# Patient Record
Sex: Female | Born: 1955 | Race: Black or African American | Hispanic: No | Smoking: Current every day smoker
Health system: Southern US, Community
[De-identification: ages and names within clinical notes are randomized; demographics above are authoritative.]

## PROBLEM LIST (undated history)

## (undated) DIAGNOSIS — Z9289 Personal history of other medical treatment: Secondary | ICD-10-CM

## (undated) DIAGNOSIS — I252 Old myocardial infarction: Secondary | ICD-10-CM

## (undated) HISTORY — PX: MULTIPLE TOOTH EXTRACTIONS: SHX2053

## (undated) MED FILL — Dexamethasone Sodium Phosphate Inj 100 MG/10ML: INTRAMUSCULAR | Qty: 1 | Status: AC

---

## 1999-10-02 ENCOUNTER — Emergency Department (HOSPITAL_COMMUNITY): Admission: EM | Admit: 1999-10-02 | Discharge: 1999-10-02 | Payer: Self-pay | Admitting: Emergency Medicine

## 1999-10-02 ENCOUNTER — Encounter: Payer: Self-pay | Admitting: Emergency Medicine

## 2000-07-28 ENCOUNTER — Emergency Department (HOSPITAL_COMMUNITY): Admission: EM | Admit: 2000-07-28 | Discharge: 2000-07-28 | Payer: Self-pay | Admitting: Emergency Medicine

## 2000-07-28 ENCOUNTER — Encounter: Payer: Self-pay | Admitting: Emergency Medicine

## 2009-10-08 ENCOUNTER — Emergency Department (HOSPITAL_COMMUNITY): Admission: EM | Admit: 2009-10-08 | Discharge: 2009-10-08 | Payer: Self-pay | Admitting: Emergency Medicine

## 2009-12-28 ENCOUNTER — Emergency Department (HOSPITAL_COMMUNITY): Admission: EM | Admit: 2009-12-28 | Discharge: 2009-12-28 | Payer: Self-pay | Admitting: Emergency Medicine

## 2010-08-31 LAB — POCT CARDIAC MARKERS
CKMB, poc: <1 ng/mL — ABNORMAL LOW (ref 1.0–8.0)
Myoglobin, poc: 81 ng/mL (ref 12–200)
Troponin i, poc: <0.05 ng/mL (ref 0.00–0.09)

## 2010-08-31 LAB — COMPREHENSIVE METABOLIC PANEL
ALT: 16 U/L (ref 0–35)
Calcium: 9.1 mg/dL (ref 8.4–10.5)
Creatinine, Ser: 0.98 mg/dL (ref 0.4–1.2)
GFR calc Af Amer: >60 mL/min (ref >60)
Glucose, Bld: 77 mg/dL (ref 70–99)
Sodium: 139 mEq/L (ref 135–145)
Total Protein: 7.9 g/dL (ref 6.0–8.3)

## 2010-08-31 LAB — POCT I-STAT, CHEM 8
BUN: 11 mg/dL (ref 6–23)
Calcium, Ion: 1.17 mmol/L (ref 1.12–1.32)
Chloride: 111 mEq/L (ref 96–112)
Creatinine, Ser: 0.9 mg/dL (ref 0.4–1.2)
Glucose, Bld: 72 mg/dL (ref 70–99)
HCT: 43 % (ref 36.0–46.0)
Hemoglobin: 14.6 g/dL (ref 12.0–15.0)
Potassium: 3.8 mEq/L (ref 3.5–5.1)
Sodium: 144 mEq/L (ref 135–145)
TCO2: 24 mmol/L (ref 0–100)

## 2010-08-31 LAB — DIFFERENTIAL
Eosinophils Absolute: 0.1 10*3/uL (ref 0.0–0.7)
Lymphocytes Relative: 27 % (ref 12–46)
Lymphs Abs: 2.3 10*3/uL (ref 0.7–4.0)
Monocytes Relative: 4 % (ref 3–12)
Neutrophils Relative %: 67 % (ref 43–77)

## 2010-08-31 LAB — CBC
HCT: 39.8 % (ref 36.0–46.0)
Hemoglobin: 13.6 g/dL (ref 12.0–15.0)
MCHC: 34.2 g/dL (ref 30.0–36.0)
MCV: 93.5 fL (ref 78.0–100.0)
Platelets: 264 K/uL (ref 150–400)
RBC: 4.26 MIL/uL (ref 3.87–5.11)
RDW: 14.5 % (ref 11.5–15.5)
WBC: 8.4 K/uL (ref 4.0–10.5)

## 2010-08-31 LAB — LIPASE, BLOOD: Lipase: 28 U/L (ref 11–59)

## 2011-02-12 DIAGNOSIS — I252 Old myocardial infarction: Secondary | ICD-10-CM

## 2011-02-12 HISTORY — DX: Old myocardial infarction: I25.2

## 2011-02-18 ENCOUNTER — Inpatient Hospital Stay (HOSPITAL_COMMUNITY)
Admission: EM | Admit: 2011-02-18 | Discharge: 2011-02-19 | DRG: 313 | Discharge status: Against Medical Advice | Payer: Self-pay | Attending: Cardiology | Admitting: Cardiology

## 2011-02-18 ENCOUNTER — Emergency Department (HOSPITAL_COMMUNITY): Payer: Self-pay

## 2011-02-18 DIAGNOSIS — R079 Chest pain, unspecified: Principal | ICD-10-CM | POA: Diagnosis present

## 2011-02-18 DIAGNOSIS — Z7982 Long term (current) use of aspirin: Secondary | ICD-10-CM

## 2011-02-18 DIAGNOSIS — F1410 Cocaine abuse, uncomplicated: Secondary | ICD-10-CM | POA: Diagnosis present

## 2011-02-18 DIAGNOSIS — Z8249 Family history of ischemic heart disease and other diseases of the circulatory system: Secondary | ICD-10-CM

## 2011-02-18 DIAGNOSIS — F17200 Nicotine dependence, unspecified, uncomplicated: Secondary | ICD-10-CM | POA: Diagnosis present

## 2011-02-18 LAB — CBC
HCT: 36.7 % (ref 36.0–46.0)
Hemoglobin: 12.4 g/dL (ref 12.0–15.0)
MCHC: 33.8 g/dL (ref 30.0–36.0)
Platelets: 231 10*3/uL (ref 150–400)
RBC: 4.17 MIL/uL (ref 3.87–5.11)
RDW: 13.9 % (ref 11.5–15.5)
WBC: 7.6 10*3/uL (ref 4.0–10.5)

## 2011-02-18 LAB — COMPREHENSIVE METABOLIC PANEL
ALT: 16 U/L (ref 0–35)
BUN: 14 mg/dL (ref 6–23)
CO2: 25 mEq/L (ref 19–32)
Calcium: 9.1 mg/dL (ref 8.4–10.5)
Creatinine, Ser: 0.91 mg/dL (ref 0.50–1.10)
GFR calc Af Amer: >60 mL/min (ref >60)
GFR calc non Af Amer: >60 mL/min (ref >60)
Glucose, Bld: 110 mg/dL — ABNORMAL HIGH (ref 70–99)

## 2011-02-19 LAB — LIPID PANEL
HDL: 45 mg/dL (ref >39)
LDL Cholesterol: 129 mg/dL — ABNORMAL HIGH (ref 0–99)
Total CHOL/HDL Ratio: 4.4 RATIO

## 2011-02-19 LAB — BASIC METABOLIC PANEL
BUN: 13 mg/dL (ref 6–23)
GFR calc non Af Amer: >60 mL/min (ref >60)
Glucose, Bld: 93 mg/dL (ref 70–99)
Potassium: 3.8 mEq/L (ref 3.5–5.1)

## 2011-02-19 LAB — CBC
MCV: 89.4 fL (ref 78.0–100.0)
Platelets: 252 10*3/uL (ref 150–400)
RDW: 14.3 % (ref 11.5–15.5)
WBC: 8.1 10*3/uL (ref 4.0–10.5)

## 2011-02-19 LAB — HEPARIN LEVEL (UNFRACTIONATED): Heparin Unfractionated: 0.36 IU/mL (ref 0.30–0.70)

## 2011-02-19 LAB — CARDIAC PANEL(CRET KIN+CKTOT+MB+TROPI)
CK, MB: 6.1 ng/mL (ref 0.3–4.0)
Troponin I: 0.67 ng/mL (ref <0.30)

## 2011-02-19 LAB — MRSA PCR SCREENING: MRSA by PCR: NEGATIVE

## 2011-02-19 LAB — TROPONIN I: Troponin I: 0.58 ng/mL (ref <0.30)

## 2011-02-19 NOTE — H&P (Signed)
[name], [name] NO.:  [id number]  MEDICAL RECORD NO.:  [id number]  LOCATION:  3313                         FACILITY:  [facility]  PHYSICIAN:  Zacarias Pontes, MD       DATE OF BIRTH:  05/26/1956  DATE OF ADMISSION:  02/18/2011 DATE OF DISCHARGE:                             HISTORY & PHYSICAL   PRIMARY CARDIOLOGIST:  None established.  CHIEF COMPLAINT:  Chest pain.  HISTORY OF PRESENT ILLNESS:  Ms. [name] is a pleasant 55 year old woman with a history of tobacco use, family history of premature coronary artery disease and an ongoing history of cocaine use who presents with 3- 4 days of chest pain.  Beginning on Monday of this past week, she began to experience chest pain which she describes as radiating from her left upper chest to her right upper chest.  She reports it is made worse when she lies flat and it is better when she sits up.  Today, her pain was associated with shortness of breath, diaphoresis and an episode of nausea.  Given what she felt was the evolution in her symptoms, she felt it is important to seek medical attention for further evaluation.  Of note, after a 2-year hiatus, the patient restarted regular coffee drinking earlier this week.  After careful and persistent questioning, the patient also admits to using cocaine this past weekend.  She generally smokes cocaine, and denies injection or drug use.  She denies paroxysmal nocturnal dyspnea, orthopnea, or any recent leg or ankle swelling.  She denies fevers, chills, or night sweats.  She denies palpitations or syncope.  PAST MEDICAL HISTORY:  The patient reports not having any significant past medical history.  SOCIAL HISTORY:  The patient reports living with a friend.  She currently smokes 2 packs a day and began smoking when she was 55 years old.  She also reports occasional cocaine use.  FAMILY HISTORY:  She has 2 sisters who experienced premature coronary artery disease.  REVIEW  OF SYSTEMS:  As per HPI, otherwise is comprehensively negative.  ALLERGIES:  No known allergies.  MEDICATIONS:  No current medications.  PHYSICAL EXAMINATION:  VITAL SIGNS:  The patient is afebrile with a heart rate of 68 and a blood pressure of 114/78, respiratory rate is 15 and she is satting 98% on 2 liters nasal cannula. GENERAL:  She is in no acute distress and is not using any accessory muscles to breathe. HEENT:  Notable for door dentition. NECK:  Supple with no masses or lymphadenopathy.  JVP is not appreciably elevated. CARDIAC:  Notable for regular rate with a normal S1, S2 with no murmurs or rubs. LUNGS:  Clear to auscultation bilaterally with no crackles or wheezes appreciated. ABDOMEN:  Soft, nontender, nondistended with no abdominal bruits. EXTREMITIES:  Warm and well perfused with no lower extremity edema.  DP pulses are 2+ bilaterally. NEUROLOGICAL:  She is alert and oriented x3 with no focal neurologic deficits detected.  LABORATORY EVALUATION:  Her white count of 7000, her hematocrit is 36, platelets 231,000.  Sodium 141, potassium 3.8, chloride 107, bicarb 25, BUN 14, creatinine 0.8 with a glucose of 110.  Troponin of 0.58 was discovered.  Her EKG demonstrates normal sinus rhythm with no ST-segment deviation. She does have T-wave inversions in V2 through V4.  IMPRESSION:  This is a 55 year old woman with ongoing tobacco use, family history of coronary artery disease and ongoing cocaine use who presents with chest pain in the setting of recent cocaine use with mild troponinemia.  She is currently chest pain free.  Her onset of chest pain does seem to correlate fairly well with her most recent use of cocaine.  Her troponin in isolation does not tell us her overall trajectory or when she might have experienced myocardial strain or ischemia.  I do not think she is in the active throes and of an acute coronary syndrome, but nevertheless, we will trend her cardiac  enzymes and her electrocardiograms to get a better sense of her trajectory, all while keeping her on aspirin as well as IV heparin in the short term. We will avoid beta-blockade for now given her recent cocaine history in the concern for paradoxical reaction to a beta-blocker.  By virtue of her ongoing substance abuse, she seems a poor candidate for invasive therapies and I would favor an invasive approach only if we are pushed by her symptoms or worsening of her status.  In the interim, we will check a transthoracic echo to assess her overall function and to assess for any focal wall motion abnormalities.  Her questions were answered to the best of my ability.          ______________________________ Zacarias Pontes, MD     DM/MEDQ  D:  02/19/2011  T:  02/19/2011  Job:  [id number]  Electronically Signed by Zacarias Pontes MD on 02/19/2011 07:13:35 AM

## 2011-05-10 NOTE — Discharge Summary (Signed)
[name], [name] NO.:  [id number]  MEDICAL RECORD NO.:  [id number]  LOCATION:  3313                         FACILITY:  [facility]  PHYSICIAN:  Jonelle Sidle, MD DATE OF BIRTH:  Jul 16, 1955  DATE OF ADMISSION:  02/18/2011 DATE OF DISCHARGE:  02/19/2011                              DISCHARGE SUMMARY   THE PATIENT LEFT AGAINST MEDICAL ADVICE  DATE PATIENT LEFT AGAINST MEDICAL ADVICE:  February 19, 2011.  CARDIOLOGIST:  None.  REASON FOR ADMISSION:  Chest pain.  DISCHARGE DIAGNOSES: 1. Chest pain with elevated cardiac markers, concerning for non-ST-     elevation myocardial infarction.     a.     Workup incomplete as the patient left against medical      advice. 2. Dyslipidemia. 3. Cocaine abuse. 4. Tobacco abuse. 5. Family history of coronary artery disease.  HOSPITAL COURSE:  [name] was admitted on February 18, 2011, by Dr. Zacarias Pontes who was on-call as the cardiology fellow.  She presented to the emergency room with chest pain that began several days prior to admission.  It was worse when she would lay flat and better when she would sit up.  Her pain was associated with shortness of breath and diaphoresis and episode of nausea on the date of presentation.  She had recently started drinking coffee again.  She also admitted to cocaine use the weekend prior.  Her EKG demonstrated no ST-segment deviation, but she did have T-wave inversions in V2 through V4.  She was admitted for further evaluation.  Her chest pain seemed to correlate with her most recent use of cocaine.  It was uncertain if elevated troponin was secondary to myocardial strain or infarction (type 1 versus type 2 NSTEMI).  She was maintained on aspirin and IV heparin.  Beta-blockers were avoided due to recent use of cocaine.  An echocardiogram was ordered.  Her troponin was noted to increase from 0.58 to 0.75.  On the morning of February 19, 2011, I was contacted by the nurse that the  patient desired to leave against medical advice.  I explained to the patient that there was significant risk of myocardial infarction, congestive heart failure, death without further evaluation.  She insisted on leaving and did leave the hospital.  Dr. Diona Browner never had the opportunity to see the patient.  LABORATORY AND ANCILLARY DATA:  Hemoglobin 12.4.  Potassium 3.8, creatinine 0.91, ALT 16.  Hemoglobin A1c 5.6.  Troponin I is 0.58, 0.75, 0.67.  CK-MB 6.1, 6.0.  Total cholesterol 200, triglycerides 128, HDL 45, LDL 129.  MRSA PCR negative. Chest x-ray on admission, no acute cardiopulmonary process seen, chronic peribronchial thickening noted.  DISPOSITION:  As noted, the patient left against medical advice prior to complete cardiac workup being completed.     Tereso Newcomer, PA-C   ______________________________ Jonelle Sidle, MD    SW/MEDQ  D:  05/10/2011  T:  05/10/2011  Job:  [id number]

## 2012-04-13 ENCOUNTER — Encounter (HOSPITAL_COMMUNITY): Payer: Self-pay | Admitting: Family Medicine

## 2012-04-13 ENCOUNTER — Emergency Department (HOSPITAL_COMMUNITY): Payer: Self-pay

## 2012-04-13 ENCOUNTER — Emergency Department (HOSPITAL_COMMUNITY)
Admission: EM | Admit: 2012-04-13 | Discharge: 2012-04-13 | Disposition: A | Discharge status: Home / Self Care | Payer: Self-pay | Attending: Emergency Medicine | Admitting: Emergency Medicine

## 2012-04-13 DIAGNOSIS — S8253XA Displaced fracture of medial malleolus of unspecified tibia, initial encounter for closed fracture: Secondary | ICD-10-CM | POA: Insufficient documentation

## 2012-04-13 DIAGNOSIS — F17200 Nicotine dependence, unspecified, uncomplicated: Secondary | ICD-10-CM | POA: Insufficient documentation

## 2012-04-13 DIAGNOSIS — Y929 Unspecified place or not applicable: Secondary | ICD-10-CM | POA: Insufficient documentation

## 2012-04-13 DIAGNOSIS — S82409A Unspecified fracture of shaft of unspecified fibula, initial encounter for closed fracture: Secondary | ICD-10-CM | POA: Insufficient documentation

## 2012-04-13 DIAGNOSIS — Y9301 Activity, walking, marching and hiking: Secondary | ICD-10-CM | POA: Insufficient documentation

## 2012-04-13 DIAGNOSIS — I252 Old myocardial infarction: Secondary | ICD-10-CM | POA: Insufficient documentation

## 2012-04-13 DIAGNOSIS — W172XXA Fall into hole, initial encounter: Secondary | ICD-10-CM | POA: Insufficient documentation

## 2012-04-13 HISTORY — DX: Old myocardial infarction: I25.2

## 2012-04-13 MED ORDER — OXYCODONE-ACETAMINOPHEN 5-325 MG PO TABS: 1.0000 | ORAL_TABLET | Freq: Four times a day (QID) | ORAL | Status: DC | PRN

## 2012-04-13 MED ORDER — HYDROMORPHONE HCL PF 1 MG/ML IJ SOLN
1.0000 mg | Freq: Once | INTRAMUSCULAR | Status: AC
  Administered 2012-04-13: 1 mg via INTRAVENOUS
  Filled 2012-04-13: qty 1

## 2012-04-13 MED ORDER — HYDROMORPHONE HCL PF 1 MG/ML IJ SOLN: 1.0000 mg | Freq: Once | INTRAMUSCULAR | Status: DC

## 2012-04-13 NOTE — ED Notes (Signed)
Per pt sts wen on a walk this am and stepped in a whole. sts heard left ankle pop.

## 2012-04-13 NOTE — ED Provider Notes (Signed)
History     CSN: [id number]  Arrival date & time 04/13/12  0830   First MD Initiated Contact with Patient 04/13/12 [phone]      Chief Complaint  Patient presents with  . Ankle Pain    (Consider location/radiation/quality/duration/timing/severity/associated sxs/prior treatment) HPI Comments: Patient reports that just prior to arrival she stepped into a hole and twisted her left ankle.  She states that she felt and heard a pop.  Unable to bear weight after the fall.  She currently has swelling and pain of the lateral malleolus.  Skin intact.  She denies numbness or tingling.  No prior injury to the area.  She has not taken anything for pain prior to arrival.  Pain worse with palpation and movement of the ankle.    The history is provided by the patient.    Past Medical History  Diagnosis Date  . MI, old     History reviewed. No pertinent past surgical history.  No family history on file.  History  Substance Use Topics  . Smoking status: Current Every Day Smoker  . Smokeless tobacco: Not on file  . Alcohol Use: Yes    OB History    Grav Para Term Preterm Abortions TAB SAB Ect Mult Living                  Review of Systems  Musculoskeletal: Positive for joint swelling and gait problem.       Left ankle swelling  All other systems reviewed and are negative.    Allergies  Review of patient's allergies indicates not on file.  Home Medications  No current outpatient prescriptions on file.  BP 106/69  Pulse 109  Temp 98 F (36.7 C)  Resp 18  SpO2 98%  Physical Exam  Nursing note and vitals reviewed. Constitutional: She appears well-developed and well-nourished. No distress.  HENT:  Head: Normocephalic and atraumatic.  Neck: Normal range of motion. Neck supple.  Cardiovascular: Normal rate, regular rhythm and normal heart sounds.   Pulses:      Dorsalis pedis pulses are 2+ on the right side, and 2+ on the left side.  Pulmonary/Chest: Effort normal and breath  sounds normal.  Musculoskeletal:       Swelling of the left lateral malleolus   Neurological: She is alert. No sensory deficit.  Skin: Skin is warm and dry. She is not diaphoretic.  Psychiatric: She has a normal mood and affect.    ED Course  Procedures (including critical care time)  Labs Reviewed - No data to display Dg Ankle Complete Left  04/13/2012  *RADIOLOGY REPORT*  Clinical Data: Twisted ankle, pain laterally  LEFT ANKLE COMPLETE - 3+ VIEW  Comparison: None.  Findings: There is an oblique displaced fracture of the distal left fibula with adjacent soft tissue swelling.  In addition there does appear to be a small fracture from the tip of the medial malleolus. The ankle joint appears unremarkable.  IMPRESSION: 1.  Oblique fracture of the distal fibula.  2.  Small fracture from the tip of the medial malleolus.   Original Report Authenticated By: Dwyane Dee, M.D.      No diagnosis found.  10:29 AM Discussed findings of the xray with Dr. Victorino Dike.  He recommends putting the patient in a posterior splint and stirrup splint and giving her crutches.  He will follow up with the patient in the office next week.  MDM  Patient with closed oblique fracture of the distal fibula and closed  small fracture of the tip of the medial malleolus.  Neurovascularly intact.  Patient given posterior splint, stirrup splint, and crutches.  Patient discharged home with Rx for pain medication.  Patient will follow up with Orthopedics next week.          Pascal Lux McDonough, PA-C 04/13/12 1835

## 2012-04-13 NOTE — ED Notes (Signed)
Discharge instructions reviewed. Pt verbalized understanding.  

## 2012-04-13 NOTE — ED Notes (Addendum)
Pt states she was walking this morning and fell in a hole and hurt her left ankle. Rates pain a 10/10.

## 2012-04-13 NOTE — ED Provider Notes (Signed)
Stepped in a hole while walking 7:30 AM today injuring left ankle no other injury. On exam alert nontoxic lower extremity swollen tender at lateral malleolus DP pulse 2+ good capillary refill  Doug Sou, MD 04/13/12 1017

## 2012-04-13 NOTE — ED Notes (Signed)
Heather, PA at the bedside.  

## 2012-04-13 NOTE — Progress Notes (Signed)
Orthopedic Tech Progress Note Patient Details:  [name] 24-Dec-1955 [id number] Post short leg splint with stirrup applied to Left LE. Tolerated well. Patient did not complain of splint being too tight nor to hot upon application. Crutches fitted for patient. Patient demonstrated correct use of crutches. Ortho Devices Type of Ortho Device: Post (short) splint;Stirrup splint Splint Material: Fiberglass Ortho Device/Splint Location: Left LE Ortho Device/Splint Interventions: Application   Asia R Thompson 04/13/2012, 12:04 PM

## 2012-04-14 NOTE — ED Provider Notes (Signed)
Medical screening examination/treatment/procedure(s) were conducted as a shared visit with non-physician practitioner(s) and myself.  I personally evaluated the patient during the encounter  Doug Sou, MD 04/14/12 1601

## 2012-04-24 ENCOUNTER — Encounter (HOSPITAL_BASED_OUTPATIENT_CLINIC_OR_DEPARTMENT_OTHER): Payer: Self-pay | Admitting: *Deleted

## 2012-04-24 NOTE — Progress Notes (Signed)
Pt states no cocaine in 3 weeks Had chest pain last yr-signed out ama -no work up done-denies any problems since

## 2012-04-25 ENCOUNTER — Encounter (HOSPITAL_BASED_OUTPATIENT_CLINIC_OR_DEPARTMENT_OTHER): Payer: Self-pay | Admitting: Anesthesiology

## 2012-04-25 NOTE — Progress Notes (Signed)
Reviewed case with dr fitzgerald for tomorrow-she signed herself out ama 9/12 for chest pain-hx cocaine abuse-anesthesia says she has to see cardiology prior to surgery-called [facility] Got her appointment 830 am-to see Dr Eden Emms- Called dr Hewitts office to let them know if cardiology does not get her workup done in time-surgery will need to be r/s  Pt aclled and informed of appt-she will be there-and to only have small amt clear liq am-in case surgery can be done later in pm.

## 2012-04-26 ENCOUNTER — Ambulatory Visit (HOSPITAL_BASED_OUTPATIENT_CLINIC_OR_DEPARTMENT_OTHER): Admission: RE | Admit: 2012-04-26 | Payer: Self-pay | Source: Ambulatory Visit | Admitting: Orthopedic Surgery

## 2012-04-26 ENCOUNTER — Encounter: Payer: Self-pay | Admitting: Cardiovascular Disease

## 2012-04-26 ENCOUNTER — Encounter (HOSPITAL_BASED_OUTPATIENT_CLINIC_OR_DEPARTMENT_OTHER): Payer: Self-pay | Admitting: Anesthesiology

## 2012-04-26 ENCOUNTER — Ambulatory Visit (INDEPENDENT_AMBULATORY_CARE_PROVIDER_SITE_OTHER): Payer: Self-pay | Admitting: Cardiovascular Disease

## 2012-04-26 ENCOUNTER — Encounter (HOSPITAL_BASED_OUTPATIENT_CLINIC_OR_DEPARTMENT_OTHER): Admission: RE | Payer: Self-pay | Source: Ambulatory Visit

## 2012-04-26 VITALS — BP 101/76 | HR 109 | Ht 69.5 in

## 2012-04-26 DIAGNOSIS — R079 Chest pain, unspecified: Secondary | ICD-10-CM | POA: Insufficient documentation

## 2012-04-26 DIAGNOSIS — R000 Tachycardia, unspecified: Secondary | ICD-10-CM | POA: Insufficient documentation

## 2012-04-26 DIAGNOSIS — Z01810 Encounter for preprocedural cardiovascular examination: Secondary | ICD-10-CM

## 2012-04-26 DIAGNOSIS — F17200 Nicotine dependence, unspecified, uncomplicated: Secondary | ICD-10-CM

## 2012-04-26 SURGERY — OPEN REDUCTION INTERNAL FIXATION (ORIF) ANKLE FRACTURE
Anesthesia: General | Site: Ankle | Laterality: Left

## 2012-04-26 NOTE — Progress Notes (Signed)
Patient ID: [name], female   DOB: April 23, 1956, 56 y.o.   MRN: [id number] 56 yo of Dr Diona Browner added to my schedule surgical clearence.  Referred by Dr Victorino Dike.  Broke left ankle about 2 weeks ago.  Stepped in a ditch.  9/12 was seen in ER for chest ;pain.  Had labile lateral T waves and positive troponin.  Advised by Dr Durenda Hurt to stay and have w/u but left AMA.  Had panic attack Has SSCP intermitantly.  Not exertional.  Sedentary smoker.  No previous stress test  SSCP is intermitant lasts seconds to minutes.  Not necessarily exertional No pleuritic component.  Has not had recent chest trauma.  No prevoius anesthesia or surgery. No bleeding diathesis  ROS: Denies fever, malais, weight loss, blurry vision, decreased visual acuity, cough, sputum, SOB, hemoptysis, pleuritic pain, palpitaitons, heartburn, abdominal pain, melena, lower extremity edema, claudication, or rash.  All other systems reviewed and negative   General: Affect appropriate Healthy:  appears stated age HEENT: normal Neck supple with no adenopathy JVP normal no bruits no thyromegaly Lungs clear with no wheezing and good diaphragmatic motion Heart:  S1/S2 no murmur,rub, gallop or click PMI normal Abdomen: benighn, BS positve, no tenderness, no AAA no bruit.  No HSM or HJR Distal pulses intact with no bruits No edema Neuro non-focal Skin warm and dry No muscular weakness  Medications Current Outpatient Prescriptions  Medication Sig Dispense Refill  . Acetaminophen (TYLENOL PO) Take by mouth as needed.      Marland Kitchen aspirin 325 MG tablet Take 325 mg by mouth daily.        Allergies Review of patient's allergies indicates no known allergies.  Family History: Family History  Problem Relation Age of Onset  . Hypertension Mother   . Diabetes Mother   . Heart disease Sister   . Heart disease Sister     Social History: History   Social History  . Marital Status: Married    Spouse Name: N/A    Number of Children: N/A   . Years of Education: N/A   Occupational History  . Not on file.   Social History Main Topics  . Smoking status: Current Every Day Smoker -- 1.0 packs/day  . Smokeless tobacco: Not on file  . Alcohol Use: Yes  . Drug Use: Yes    Special: Cocaine     Comment: last 3 weeks ago  . Sexually Active:    Other Topics Concern  . Not on file   Social History Narrative  . No narrative on file    Electrocardiogram:  SR rate 104 nonspecfic T wave changes compared to 02/18/11 rate 91 continues to have labile T waves laterally Assessment and Plan

## 2012-04-26 NOTE — Progress Notes (Addendum)
Pt had her surgery deferred today after being seen by Dr. Eden Emms to further clarify her cardiac problems.  She was admitted to the ED at [facility] for SSCP on 02/18/11 and had a positive Troponin.  She left AMA before allowing her cardiac workup to be completed.  With her smoking history, use of cocaine,  and no previous stress test, Dr. Eden Emms felt she should be postponed until this could be completed.  Dr. Victorino Dike and I discussed this recommendation and agreed that she should wait until a Myoview could be done.  This is scheduled for Tuesday, Nov. 19, 2013.

## 2012-04-26 NOTE — Assessment & Plan Note (Signed)
Has signed up for cessation class in [facility]  Hopefully will reschedule post surgery

## 2012-04-26 NOTE — Assessment & Plan Note (Signed)
Relative.  F/U primary for TSH/Hct  No beta blocker given low BP

## 2012-04-26 NOTE — Assessment & Plan Note (Signed)
Atypical but ER visit 9/12 with incomplete w/u  Lexiscan myovue given labile T waves on ECG and smoking

## 2012-04-26 NOTE — Patient Instructions (Signed)
Your physician recommends that you schedule a follow-up appointment  As needed with Dr. Eden Emms  Your physician has requested that you have a lexiscan myoview. For further information please visit [email or web address] Please follow instruction sheet, as given.

## 2012-04-26 NOTE — Assessment & Plan Note (Signed)
Clear to have surgery if myovue normal.  No other high risk features relatively young, low risk surgery no previous anesthesia issues and no bleeding problems

## 2012-04-30 ENCOUNTER — Encounter (HOSPITAL_COMMUNITY): Payer: Self-pay | Admitting: Pharmacy Technician

## 2012-05-01 ENCOUNTER — Ambulatory Visit (HOSPITAL_COMMUNITY): Payer: Self-pay | Attending: Cardiology | Admitting: Radiology

## 2012-05-01 VITALS — BP 93/61 | Ht 70.0 in | Wt 200.0 lb

## 2012-05-01 DIAGNOSIS — R079 Chest pain, unspecified: Secondary | ICD-10-CM | POA: Insufficient documentation

## 2012-05-01 DIAGNOSIS — R0989 Other specified symptoms and signs involving the circulatory and respiratory systems: Secondary | ICD-10-CM

## 2012-05-01 DIAGNOSIS — R9431 Abnormal electrocardiogram [ECG] [EKG]: Secondary | ICD-10-CM

## 2012-05-01 MED ORDER — TECHNETIUM TC 99M SESTAMIBI GENERIC - CARDIOLITE
10.0000 | Freq: Once | INTRAVENOUS | Status: AC | PRN
  Administered 2012-05-01: 10 via INTRAVENOUS

## 2012-05-01 MED ORDER — TECHNETIUM TC 99M SESTAMIBI GENERIC - CARDIOLITE
30.0000 | Freq: Once | INTRAVENOUS | Status: AC | PRN
  Administered 2012-05-01: 30 via INTRAVENOUS

## 2012-05-01 MED ORDER — REGADENOSON 0.4 MG/5ML IV SOLN
0.4000 mg | Freq: Once | INTRAVENOUS | Status: AC
  Administered 2012-05-01: 0.4 mg via INTRAVENOUS

## 2012-05-01 NOTE — Progress Notes (Signed)
[facility] SITE 3 NUCLEAR MED [id number] [place] [address] [phone]  Cardiology Nuclear Med Study  [name] is a 56 y.o. female     MRN : [id number]     DOB: [birth year]  Procedure Date: 05/01/2012  Nuclear Med Background Indication for Stress Test:  Evaluation for Ischemia, Surgical Clearance and Abnormal EKG History:  Substance Abuse Crack Cocaine 36 hrs ago Cardiac Risk Factors: Family History - CAD, Lipids, Smoker and Substance Abuse  Symptoms:  Chest Pain   Nuclear Pre-Procedure Caffeine/Decaff Intake:  None NPO After: 4 pm   Lungs:  clear O2 Sat: 96% on room air. IV 0.9% NS with Angio Cath:  22g  IV Site: L Antecubital  IV Started by:  Milana Na, EMT-P  Chest Size (in):  38 Cup Size: D  Height: 5\' 10"  (1.778 m)  Weight:  200 lb (90.719 kg)  BMI:  Body mass index is 28.70 kg/(m^2). Tech Comments:  Per D.McLean ok to do test with Cocaine use    Nuclear Med Study 1 or 2 day study: 1 day  Stress Test Type:  Eugenie Birks  Reading MD: Marca Ancona, MD  Order Authorizing Provider:  P.Nishan MD  Resting Radionuclide: Technetium 78m Sestamibi  Resting Radionuclide Dose: 11.0 mCi   Stress Radionuclide:  Technetium 4m Sestamibi  Stress Radionuclide Dose: 33.0 mCi           Stress Protocol Rest HR: 89 Stress HR: 109  Rest BP: 93/61 Stress BP: 132/61  Exercise Time (min): n/a METS: n/a   Predicted Max HR: 165 bpm % Max HR: 66.06 bpm Rate Pressure Product: 42595   Dose of Adenosine (mg):  n/a Dose of Lexiscan: 0.4 mg  Dose of Atropine (mg): n/a Dose of Dobutamine: n/a mcg/kg/min (at max HR)  Stress Test Technologist: Frederick Peers, EMT-P  Nuclear Technologist:  Domenic Polite, CNMT     Rest Procedure:  Myocardial perfusion imaging was performed at rest 45 minutes following the intravenous administration of Technetium 34m Sestamibi. Rest ECG: NSR with non-specific ST-T wave changes  Stress Procedure:  The patient  received IV Lexiscan 0.4 mg over 15-seconds.  Technetium 17m Sestamibi injected at 30-seconds.  There were no significant changes with Lexiscan.  Quantitative spect images were obtained after a 45 minute delay. Stress ECG: No significant change from baseline ECG  QPS Raw Data Images:  Normal; no motion artifact; normal heart/lung ratio. Stress Images:  Small, mild apical perfusion defect.  Rest Images:  Small, mild apical perfusion defect, less marked than stress.  Subtraction (SDS):  Small, mild, partially reversible apical perfusion defect.  Transient Ischemic Dilatation (Normal <1.22):  1.04 Lung/Heart Ratio (Normal <0.45):  0.34  Quantitative Gated Spect Images QGS EDV:  57 ml QGS ESV:  20 ml  Impression Exercise Capacity:  Lexiscan with no exercise. BP Response:  Hypotensive blood pressure response. Clinical Symptoms:  Chest felt funny ECG Impression:  No significant ST segment change suggestive of ischemia. Comparison with Prior Nuclear Study: No images to compare  Overall Impression:  Low risk stress nuclear study.  Small, mild, partially reversible apical perfusion defect in the setting of prominent breast shadowing is likely shifting breast artifact.  No evidence for ischemia or infarction.   LV Ejection Fraction: 66%.  LV Wall Motion:  NL LV Function; NL Wall Motion  Marca Ancona 05/01/2012

## 2012-05-01 NOTE — Pre-Procedure Instructions (Signed)
20 [name]  05/01/2012   Your procedure is scheduled on:  Thursday, November 21st.  Report to [facility] at 8:30AM.  Call this number if you have problems the morning of surgery: [phone]   Remember: Nothing to eat or drink after Midnight.     Take these medicines the morning of surgery with A SIP OF WATER:  May take Acetaminophen (Tylenol) if needed.                 Do not wear jewelry, make-up or nail polish.  Do not wear lotions, powders, or perfumes. You may wear deodorant.    Do not bring valuables to the hospital.  Contacts, dentures or bridgework may not be worn into surgery.  Leave suitcase in the car. After surgery it may be brought to your room.  For patients admitted to the hospital, checkout time is 11:00 AM the day of discharge.     Patients discharged the day of surgery will not be allowed to drive home.  Name and phone number of your driver: ___/w spouse_____________________              Special Instructions: Shower with CHG wash (Bactoshield) tonight and again in the am prior to arriving to hospital.    Please read over the following fact sheets that you were given: Pain Booklet, Coughing and Deep Breathing and Surgical Site Infection Prevention

## 2012-05-02 ENCOUNTER — Encounter: Payer: Self-pay | Admitting: *Deleted

## 2012-05-02 ENCOUNTER — Encounter (HOSPITAL_COMMUNITY)
Admission: RE | Admit: 2012-05-02 | Discharge: 2012-05-02 | Disposition: A | Discharge status: Home / Self Care | Payer: Self-pay | Source: Ambulatory Visit | Attending: Orthopedic Surgery | Admitting: Orthopedic Surgery

## 2012-05-02 ENCOUNTER — Encounter (HOSPITAL_COMMUNITY): Payer: Self-pay

## 2012-05-02 HISTORY — DX: Personal history of other medical treatment: Z92.89

## 2012-05-02 LAB — CBC
Hemoglobin: 12.7 g/dL (ref 12.0–15.0)
MCH: 29 pg (ref 26.0–34.0)
RBC: 4.38 MIL/uL (ref 3.87–5.11)
WBC: 7.2 10*3/uL (ref 4.0–10.5)

## 2012-05-02 LAB — COMPREHENSIVE METABOLIC PANEL
AST: 16 U/L (ref 0–37)
BUN: 16 mg/dL (ref 6–23)
CO2: 24 mEq/L (ref 19–32)
Calcium: 9.6 mg/dL (ref 8.4–10.5)
Creatinine, Ser: 0.86 mg/dL (ref 0.50–1.10)
GFR calc non Af Amer: 75 mL/min — ABNORMAL LOW (ref >90)
Total Bilirubin: 0.3 mg/dL (ref 0.3–1.2)

## 2012-05-02 MED ORDER — CEFAZOLIN SODIUM-DEXTROSE 2-3 GM-% IV SOLR
2.0000 g | INTRAVENOUS | Status: AC
  Administered 2012-05-03: 2 g via INTRAVENOUS
  Filled 2012-05-02: qty 50

## 2012-05-03 ENCOUNTER — Ambulatory Visit (HOSPITAL_COMMUNITY): Payer: Self-pay | Admitting: Anesthesiology

## 2012-05-03 ENCOUNTER — Encounter (HOSPITAL_COMMUNITY)
Admission: RE | Disposition: A | Discharge status: Home / Self Care | Payer: Self-pay | Source: Ambulatory Visit | Attending: Orthopedic Surgery

## 2012-05-03 ENCOUNTER — Encounter (HOSPITAL_COMMUNITY): Payer: Self-pay | Admitting: Anesthesiology

## 2012-05-03 ENCOUNTER — Ambulatory Visit (HOSPITAL_COMMUNITY): Payer: Self-pay

## 2012-05-03 ENCOUNTER — Encounter (HOSPITAL_COMMUNITY): Payer: Self-pay | Admitting: *Deleted

## 2012-05-03 ENCOUNTER — Ambulatory Visit (HOSPITAL_COMMUNITY)
Admission: RE | Admit: 2012-05-03 | Discharge: 2012-05-03 | Disposition: A | Discharge status: Home / Self Care | Payer: Self-pay | Source: Ambulatory Visit | Attending: Orthopedic Surgery | Admitting: Orthopedic Surgery

## 2012-05-03 DIAGNOSIS — I252 Old myocardial infarction: Secondary | ICD-10-CM | POA: Insufficient documentation

## 2012-05-03 DIAGNOSIS — X58XXXA Exposure to other specified factors, initial encounter: Secondary | ICD-10-CM | POA: Insufficient documentation

## 2012-05-03 DIAGNOSIS — Z01812 Encounter for preprocedural laboratory examination: Secondary | ICD-10-CM | POA: Insufficient documentation

## 2012-05-03 DIAGNOSIS — Z79899 Other long term (current) drug therapy: Secondary | ICD-10-CM | POA: Insufficient documentation

## 2012-05-03 DIAGNOSIS — S82843A Displaced bimalleolar fracture of unspecified lower leg, initial encounter for closed fracture: Secondary | ICD-10-CM | POA: Insufficient documentation

## 2012-05-03 DIAGNOSIS — S82842A Displaced bimalleolar fracture of left lower leg, initial encounter for closed fracture: Secondary | ICD-10-CM

## 2012-05-03 DIAGNOSIS — F17200 Nicotine dependence, unspecified, uncomplicated: Secondary | ICD-10-CM | POA: Insufficient documentation

## 2012-05-03 HISTORY — PX: ORIF ANKLE FRACTURE: SHX5408

## 2012-05-03 SURGERY — OPEN REDUCTION INTERNAL FIXATION (ORIF) ANKLE FRACTURE
Anesthesia: General | Site: Ankle | Laterality: Left | Wound class: Clean

## 2012-05-03 MED ORDER — METOCLOPRAMIDE HCL 5 MG/ML IJ SOLN
10.0000 mg | Freq: Once | INTRAMUSCULAR | Status: AC
  Administered 2012-05-03: 10 mg via INTRAVENOUS

## 2012-05-03 MED ORDER — ARTIFICIAL TEARS OP OINT
TOPICAL_OINTMENT | OPHTHALMIC | Status: DC | PRN
  Administered 2012-05-03: 1 via OPHTHALMIC

## 2012-05-03 MED ORDER — METOCLOPRAMIDE HCL 5 MG/ML IJ SOLN
INTRAMUSCULAR | Status: AC
  Filled 2012-05-03: qty 2

## 2012-05-03 MED ORDER — HYDROMORPHONE HCL PF 1 MG/ML IJ SOLN
INTRAMUSCULAR | Status: AC
  Filled 2012-05-03: qty 1

## 2012-05-03 MED ORDER — MIDAZOLAM HCL 2 MG/2ML IJ SOLN
INTRAMUSCULAR | Status: AC
  Administered 2012-05-03: 0.5 mg
  Filled 2012-05-03: qty 2

## 2012-05-03 MED ORDER — DOUBLE ANTIBIOTIC 500-10000 UNIT/GM EX OINT
TOPICAL_OINTMENT | CUTANEOUS | Status: AC
  Filled 2012-05-03: qty 1

## 2012-05-03 MED ORDER — LACTATED RINGERS IV SOLN
INTRAVENOUS | Status: DC
  Administered 2012-05-03: 10:00:00 via INTRAVENOUS

## 2012-05-03 MED ORDER — SODIUM CHLORIDE 0.9 % IV SOLN: INTRAVENOUS | Status: DC

## 2012-05-03 MED ORDER — MIDAZOLAM HCL 5 MG/5ML IJ SOLN
INTRAMUSCULAR | Status: DC | PRN
  Administered 2012-05-03 (×2): 1 mg via INTRAVENOUS

## 2012-05-03 MED ORDER — PHENYLEPHRINE HCL 10 MG/ML IJ SOLN
INTRAMUSCULAR | Status: DC | PRN
  Administered 2012-05-03: 80 ug via INTRAVENOUS
  Administered 2012-05-03: 40 ug via INTRAVENOUS

## 2012-05-03 MED ORDER — PROPOFOL 10 MG/ML IV BOLUS
INTRAVENOUS | Status: DC | PRN
  Administered 2012-05-03: 40 mg via INTRAVENOUS
  Administered 2012-05-03: 120 mg via INTRAVENOUS
  Administered 2012-05-03: 40 mg via INTRAVENOUS

## 2012-05-03 MED ORDER — ONDANSETRON HCL 4 MG/2ML IJ SOLN
INTRAMUSCULAR | Status: DC | PRN
  Administered 2012-05-03 (×2): 4 mg via INTRAVENOUS

## 2012-05-03 MED ORDER — NEOSTIGMINE METHYLSULFATE 1 MG/ML IJ SOLN
INTRAMUSCULAR | Status: DC | PRN
  Administered 2012-05-03: 5 mg via INTRAVENOUS

## 2012-05-03 MED ORDER — CHLORHEXIDINE GLUCONATE 4 % EX LIQD: 60.0000 mL | Freq: Once | CUTANEOUS | Status: DC

## 2012-05-03 MED ORDER — LIDOCAINE HCL (CARDIAC) 20 MG/ML IV SOLN
INTRAVENOUS | Status: DC | PRN
  Administered 2012-05-03: 100 mg via INTRAVENOUS

## 2012-05-03 MED ORDER — 0.9 % SODIUM CHLORIDE (POUR BTL) OPTIME
TOPICAL | Status: DC | PRN
  Administered 2012-05-03: 1000 mL

## 2012-05-03 MED ORDER — BACITRACIN ZINC 500 UNIT/GM EX OINT
TOPICAL_OINTMENT | CUTANEOUS | Status: DC | PRN
  Administered 2012-05-03: 1 via TOPICAL

## 2012-05-03 MED ORDER — RIVAROXABAN 10 MG PO TABS: 10.0000 mg | ORAL_TABLET | Freq: Every day | ORAL | Status: DC

## 2012-05-03 MED ORDER — FENTANYL CITRATE 0.05 MG/ML IJ SOLN
INTRAMUSCULAR | Status: DC | PRN
  Administered 2012-05-03 (×2): 50 ug via INTRAVENOUS
  Administered 2012-05-03: 150 ug via INTRAVENOUS

## 2012-05-03 MED ORDER — DEXTROSE 5 % IV SOLN
INTRAVENOUS | Status: DC | PRN
  Administered 2012-05-03: 11:00:00 via INTRAVENOUS

## 2012-05-03 MED ORDER — OXYCODONE HCL 5 MG PO TABS: 5.0000 mg | ORAL_TABLET | ORAL | Status: DC | PRN

## 2012-05-03 MED ORDER — ROCURONIUM BROMIDE 100 MG/10ML IV SOLN
INTRAVENOUS | Status: DC | PRN
  Administered 2012-05-03: 50 mg via INTRAVENOUS

## 2012-05-03 MED ORDER — OXYCODONE HCL 5 MG PO TABS
ORAL_TABLET | ORAL | Status: AC
  Filled 2012-05-03: qty 1

## 2012-05-03 MED ORDER — HYDROMORPHONE HCL PF 1 MG/ML IJ SOLN: 0.2500 mg | INTRAMUSCULAR | Status: DC | PRN

## 2012-05-03 MED ORDER — GLYCOPYRROLATE 0.2 MG/ML IJ SOLN
INTRAMUSCULAR | Status: DC | PRN
  Administered 2012-05-03: .8 mg via INTRAVENOUS

## 2012-05-03 MED ORDER — HYDROMORPHONE HCL PF 1 MG/ML IJ SOLN
0.2500 mg | INTRAMUSCULAR | Status: DC | PRN
  Administered 2012-05-03 (×5): 0.5 mg via INTRAVENOUS

## 2012-05-03 MED ORDER — LACTATED RINGERS IV SOLN
INTRAVENOUS | Status: DC | PRN
  Administered 2012-05-03 (×2): via INTRAVENOUS

## 2012-05-03 SURGICAL SUPPLY — 56 items
BANDAGE ESMARK 6X9 LF (GAUZE/BANDAGES/DRESSINGS) ×1 IMPLANT
BIT DRILL 2.5X2.75 QC CALB (BIT) ×2 IMPLANT
BIT DRILL 3.5X5.5 QC CALB (BIT) ×2 IMPLANT
BLADE SURG 15 STRL LF DISP TIS (BLADE) ×2 IMPLANT
BLADE SURG 15 STRL SS (BLADE) ×2
BNDG COHESIVE 4X5 TAN STRL (GAUZE/BANDAGES/DRESSINGS) ×2 IMPLANT
BNDG COHESIVE 6X5 TAN STRL LF (GAUZE/BANDAGES/DRESSINGS) ×2 IMPLANT
BNDG ESMARK 6X9 LF (GAUZE/BANDAGES/DRESSINGS) ×2
CHLORAPREP W/TINT 26ML (MISCELLANEOUS) ×2 IMPLANT
CLOTH BEACON ORANGE TIMEOUT ST (SAFETY) ×2 IMPLANT
COVER SURGICAL LIGHT HANDLE (MISCELLANEOUS) ×2 IMPLANT
CUFF TOURNIQUET SINGLE 34IN LL (TOURNIQUET CUFF) ×2 IMPLANT
CUFF TOURNIQUET SINGLE 44IN (TOURNIQUET CUFF) IMPLANT
DRAPE C-ARM 42X72 X-RAY (DRAPES) ×2 IMPLANT
DRAPE C-ARMOR (DRAPES) ×2 IMPLANT
DRAPE U-SHAPE 47X51 STRL (DRAPES) ×2 IMPLANT
DRSG ADAPTIC 3X8 NADH LF (GAUZE/BANDAGES/DRESSINGS) IMPLANT
DRSG PAD ABDOMINAL 8X10 ST (GAUZE/BANDAGES/DRESSINGS) ×4 IMPLANT
ELECT REM PT RETURN 9FT ADLT (ELECTROSURGICAL) ×2
ELECTRODE REM PT RTRN 9FT ADLT (ELECTROSURGICAL) ×1 IMPLANT
GLOVE BIO SURGEON STRL SZ8 (GLOVE) ×2 IMPLANT
GLOVE BIOGEL PI IND STRL 8 (GLOVE) ×1 IMPLANT
GLOVE BIOGEL PI INDICATOR 8 (GLOVE) ×1
GOWN PREVENTION PLUS XLARGE (GOWN DISPOSABLE) ×2 IMPLANT
GOWN STRL NON-REIN LRG LVL3 (GOWN DISPOSABLE) ×4 IMPLANT
KIT BASIN OR (CUSTOM PROCEDURE TRAY) ×2 IMPLANT
KIT ROOM TURNOVER OR (KITS) ×2 IMPLANT
MANIFOLD NEPTUNE II (INSTRUMENTS) ×2 IMPLANT
NEEDLE 22X1 1/2 (OR ONLY) (NEEDLE) IMPLANT
NS IRRIG 1000ML POUR BTL (IV SOLUTION) ×2 IMPLANT
PACK ORTHO EXTREMITY (CUSTOM PROCEDURE TRAY) ×2 IMPLANT
PAD ARMBOARD 7.5X6 YLW CONV (MISCELLANEOUS) ×4 IMPLANT
PAD CAST 4YDX4 CTTN HI CHSV (CAST SUPPLIES) ×1 IMPLANT
PADDING CAST COTTON 4X4 STRL (CAST SUPPLIES) ×2
PLATE ACE 100DEG 7HOLE (Plate) ×2 IMPLANT
SCREW CORTICAL 3.5MM  16MM (Screw) ×3 IMPLANT
SCREW CORTICAL 3.5MM  28MM (Screw) ×1 IMPLANT
SCREW CORTICAL 3.5MM 14MM (Screw) ×6 IMPLANT
SCREW CORTICAL 3.5MM 16MM (Screw) ×3 IMPLANT
SCREW CORTICAL 3.5MM 28MM (Screw) ×1 IMPLANT
SPONGE GAUZE 4X4 12PLY (GAUZE/BANDAGES/DRESSINGS) IMPLANT
SPONGE LAP 18X18 X RAY DECT (DISPOSABLE) ×2 IMPLANT
STAPLER VISISTAT 35W (STAPLE) IMPLANT
SUCTION FRAZIER TIP 10 FR DISP (SUCTIONS) ×2 IMPLANT
SUT ETHILON 3 0 PS 1 (SUTURE) ×2 IMPLANT
SUT MNCRL AB 3-0 PS2 18 (SUTURE) IMPLANT
SUT PROLENE 3 0 PS 2 (SUTURE) IMPLANT
SUT VIC AB 2-0 CT1 27 (SUTURE) ×1
SUT VIC AB 2-0 CT1 TAPERPNT 27 (SUTURE) ×1 IMPLANT
SUT VIC AB 3-0 PS2 18 (SUTURE)
SUT VIC AB 3-0 PS2 18XBRD (SUTURE) IMPLANT
SYR CONTROL 10ML LL (SYRINGE) IMPLANT
TOWEL OR 17X24 6PK STRL BLUE (TOWEL DISPOSABLE) ×2 IMPLANT
TOWEL OR 17X26 10 PK STRL BLUE (TOWEL DISPOSABLE) ×2 IMPLANT
TUBE CONNECTING 12X1/4 (SUCTIONS) ×2 IMPLANT
WATER STERILE IRR 1000ML POUR (IV SOLUTION) ×2 IMPLANT

## 2012-05-03 NOTE — Progress Notes (Signed)
Pt states she want to cont to go home.  States she could benefit from a wheelchair, that the crutches are not always useful in her home.

## 2012-05-03 NOTE — Anesthesia Postprocedure Evaluation (Signed)
  Anesthesia Post-op Note  Patient: [name]  Procedure(s) Performed: Procedure(s) (LRB) with comments: OPEN REDUCTION INTERNAL FIXATION (ORIF) ANKLE FRACTURE (Left)  Patient Location: PACU  Anesthesia Type:General  Level of Consciousness: awake  Airway and Oxygen Therapy: Patient Spontanous Breathing  Post-op Pain: mild  Post-op Assessment: Post-op Vital signs reviewed  Post-op Vital Signs: Reviewed  Complications: No apparent anesthesia complications

## 2012-05-03 NOTE — Transfer of Care (Signed)
Immediate Anesthesia Transfer of Care Note  Patient: [name]  Procedure(s) Performed: Procedure(s) (LRB) with comments: OPEN REDUCTION INTERNAL FIXATION (ORIF) ANKLE FRACTURE (Left)  Patient Location: PACU  Anesthesia Type:General  Level of Consciousness: awake, alert  and oriented  Airway & Oxygen Therapy: Patient Spontanous Breathing and Patient connected to nasal cannula oxygen  Post-op Assessment: Report given to PACU RN, Post -op Vital signs reviewed and stable and Patient moving all extremities  Post vital signs: Reviewed and stable  Complications: No apparent anesthesia complications

## 2012-05-03 NOTE — Progress Notes (Signed)
Pt now in wheelchair to go to ssc.   Case mgt at bedside with form to complete to obtain WC.

## 2012-05-03 NOTE — Preoperative (Signed)
Beta Blockers   Reason not to administer Beta Blockers:Not Applicable 

## 2012-05-03 NOTE — Progress Notes (Signed)
Dr Victorino Dike has given order for wheelchair.  Case mgt at bedside to arrange for this either before she leaves today, or will be delivered tomorrow.

## 2012-05-03 NOTE — Progress Notes (Signed)
Dr Victorino Dike paged.

## 2012-05-03 NOTE — H&P (Signed)
[name] is an 56 y.o. female.   Chief Complaint: left ankle fracture  HPI: 56 y/o female with PMH of CAD presents for ORIF of her left ankle displaced ankle fracture.  Past Medical History  Diagnosis Date  . 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 History of cardiovascular stress test     done in preparation of surgery- 05/01/2012    Past Surgical History  Procedure Date  . Multiple tooth extractions     Family History  Problem Relation Age of Onset  . Hypertension Mother   . Diabetes Mother   . Heart disease Sister   . Heart disease Sister    Social History:  reports that she has been smoking.  She does not have any smokeless tobacco history on file. She reports that she drinks alcohol. She reports that she uses illicit drugs (Cocaine).  Allergies: No Known Allergies  Medications Prior to Admission  Medication Sig Dispense Refill  . acetaminophen (TYLENOL) 500 MG tablet Take 1,000 mg by mouth every 4 (four) hours as needed. For pain      . aspirin 325 MG tablet Take 325 mg by mouth daily.        Results for orders placed during the hospital encounter of 05/02/12 (from the past 48 hour(s))  COMPREHENSIVE METABOLIC PANEL     Status: Abnormal   Collection Time   05/02/12  9:59 AM      Component Value Range Comment   Sodium 141  135 - 145 mEq/L    Potassium 4.4  3.5 - 5.1 mEq/L    Chloride 108  96 - 112 mEq/L    CO2 24  19 - 32 mEq/L    Glucose, Bld 94  70 - 99 mg/dL    BUN 16  6 - 23 mg/dL    Creatinine, Ser 3.08  0.50 - 1.10 mg/dL    Calcium 9.6  8.4 - 65.7 mg/dL    Total Protein 8.1  6.0 - 8.3 g/dL    Albumin 3.8  3.5 - 5.2 g/dL    AST 16  0 - 37 U/L    ALT 15  0 - 35 U/L    Alkaline Phosphatase 76  39 - 117 U/L    Total Bilirubin 0.3  0.3 - 1.2 mg/dL    GFR calc non Af Amer 75 (*) >90 mL/min    GFR calc Af Amer 87 (*) >90 mL/min   CBC     Status: Normal   Collection Time   05/02/12  9:59 AM      Component Value Range  Comment   WBC 7.2  4.0 - 10.5 K/uL    RBC 4.38  3.87 - 5.11 MIL/uL    Hemoglobin 12.7  12.0 - 15.0 g/dL    HCT 84.6  96.2 - 95.2 %    MCV 88.6  78.0 - 100.0 fL    MCH 29.0  26.0 - 34.0 pg    MCHC 32.7  30.0 - 36.0 g/dL    RDW 84.1  32.4 - 40.1 %    Platelets 347  150 - 400 K/uL   SURGICAL PCR SCREEN     Status: Normal   Collection Time   05/02/12  9:59 AM      Component Value Range Comment   MRSA, PCR NEGATIVE  NEGATIVE    Staphylococcus aureus NEGATIVE  NEGATIVE    No results found.  ROS  No recent f/c/n/v/wt loss  Blood pressure 121/83, pulse 90, temperature 98.5 F (36.9 C), temperature source Oral, resp. rate 18, SpO2 99.00%. Physical Exam wn wd woman in nad.  A and O x 4.  Mood and affec tnormal.  EOMI.  Respirations unlabored.  L LE splinted.  Wiggles toes and feels LT at toes.  Brisk cap refill at toes.  Assessment/Plan L ankle bimal fracture - to OR for ORIF.  The risks and benefits of the alternative treatment options have been discussed in detail.  The patient wishes to proceed with surgery and specifically understands risks of bleeding, infection, nerve damage, blood clots, need for additional surgery, amputation and death.   Toni Arthurs May 16, 2012, 10:44 AM

## 2012-05-03 NOTE — Anesthesia Procedure Notes (Addendum)
Anesthesia Regional Block:   Narrative:    Procedure Name: Intubation Date/Time: 05/03/2012 11:01 AM Performed by: Julianne Rice K Pre-anesthesia Checklist: Emergency Drugs available, Patient identified, Timeout performed, Suction available and Patient being monitored Patient Re-evaluated:Patient Re-evaluated prior to inductionOxygen Delivery Method: Circle system utilized Preoxygenation: Pre-oxygenation with 100% oxygen Intubation Type: IV induction Ventilation: Mask ventilation without difficulty Laryngoscope Size: Miller and 3 Tube type: Oral Tube size: 8.0 mm Number of attempts: 1 Airway Equipment and Method: Stylet and LTA kit utilized Placement Confirmation: ETT inserted through vocal cords under direct vision,  breath sounds checked- equal and bilateral and positive ETCO2 Secured at: 22 cm Tube secured with: Tape Dental Injury: Teeth and Oropharynx as per pre-operative assessment

## 2012-05-03 NOTE — Brief Op Note (Signed)
05/03/2012  12:06 PM  PATIENT:  [name]  56 y.o. female  PRE-OPERATIVE DIAGNOSIS:  left ankle bimalleolar fracture  POST-OPERATIVE DIAGNOSIS:  left ankle, bimalleolar fracture  Procedure(s): 1.  ORIF left ankle bimalleolar fracture 2.  Fluoro 3.  Stress exam  SURGEON:  Toni Arthurs, MD  ASSISTANT: n/a  ANESTHESIA:   General, regional  EBL:  minimal   TOURNIQUET:   Total Tourniquet Time Documented: Thigh (Left) - 32 minutes  COMPLICATIONS:  None apparent  DISPOSITION:  Extubated, awake and stable to recovery.  DICTATION ID:  [id number]

## 2012-05-03 NOTE — Anesthesia Preprocedure Evaluation (Addendum)
Anesthesia Evaluation  Patient identified by MRN, date of birth, ID band Patient awake    Reviewed: Allergy & Precautions, H&P , NPO status , Patient's Chart, lab work & pertinent test results  Airway Mallampati: II      Dental   Pulmonary Current Smoker,  breath sounds clear to auscultation        Cardiovascular + Past MI Rhythm:Regular Rate:Normal     Neuro/Psych negative neurological ROS  negative psych ROS   GI/Hepatic negative GI ROS, (+)     substance abuse  cocaine use,   Endo/Other  negative endocrine ROS  Renal/GU negative Renal ROS  negative genitourinary   Musculoskeletal negative musculoskeletal ROS (+)   Abdominal   Peds  Hematology negative hematology ROS (+)   Anesthesia Other Findings   Reproductive/Obstetrics                         Anesthesia Physical Anesthesia Plan  ASA: III  Anesthesia Plan: General   Post-op Pain Management:    Induction: Intravenous  Airway Management Planned: Oral ETT  Additional Equipment:   Intra-op Plan:   Post-operative Plan: Extubation in OR  Informed Consent: I have reviewed the patients History and Physical, chart, labs and discussed the procedure including the risks, benefits and alternatives for the proposed anesthesia with the patient or authorized representative who has indicated his/her understanding and acceptance.   Dental advisory given  Plan Discussed with: Anesthesiologist, Surgeon and CRNA  Anesthesia Plan Comments:        Anesthesia Quick Evaluation

## 2012-05-04 ENCOUNTER — Encounter (HOSPITAL_COMMUNITY): Payer: Self-pay | Admitting: Orthopedic Surgery

## 2012-05-04 NOTE — Op Note (Signed)
NAMEMINDI, AKERSON                ACCOUNT NO.:  [id number]  MEDICAL RECORD NO.:  [id number]  LOCATION:  [facility]                         FACILITY:  [facility]  PHYSICIAN:  Toni Arthurs, MD        DATE OF BIRTH:  1956-06-01  DATE OF PROCEDURE:  05/03/2012 DATE OF DISCHARGE:  05/03/2012                              OPERATIVE REPORT   PREOPERATIVE DIAGNOSIS:  Left ankle bimalleolar fracture.  POSTOPERATIVE DIAGNOSIS:  Left ankle bimalleolar fracture.  PROCEDURES: 1. Open reduction and internal fixation of left ankle bimalleolar     fracture. 2. Intraoperative interpretation of fluoroscopic imaging. 3. Stress examination of left ankle under fluoroscopy.  SURGEON:  Toni Arthurs, MD  ANESTHESIA:  General, regional.  ESTIMATED BLOOD LOSS:  Minimal.  TOURNIQUET TIME:  32 minutes at 250 mmHg.  COMPLICATIONS:  None apparent.  DISPOSITION:  Extubated, awake and stable to recovery.  INDICATION FOR PROCEDURE:  The patient is a 56 year old female who had a left ankle injury approximately 2 weeks ago.  She has been cleared by Cardiology for surgery.  She presents now for operative treatment of this displaced unstable ankle injury.  She understands the risks and benefits of the alternative treatment options and elects surgical treatment.  She specifically understands risks of bleeding, infection, nerve damage, blood clots, need for additional surgery, amputation, and death.  PROCEDURE IN DETAIL:  After preoperative consent was obtained and the correct operative site was identified, the patient was brought to the operating room and placed supine on the operating table.  General anesthesia was induced.  Preoperative antibiotics were administered. Surgical time-out was taken.  The left lower extremity was prepped and draped in standard sterile fashion with a tourniquet around the thigh. The extremity was exsanguinated and the tourniquet was inflated to 250 mmHg.  A longitudinal incision was made  over the lateral malleolus. Sharp dissection was carried down through the skin and subcutaneous tissue.  The fracture site was exposed.  It was cleaned of all hematoma. The fracture was reduced and clamped with a tenaculum.  AP and lateral fluoroscopic images confirmed appropriate reduction of the fracture.  A 3.5-mm fully-threaded lag screw was then inserted from posterior to anterior perpendicular to the fracture line.  It was noted to have excellent purchase.  A 7-hole one-third tubular plate was then contoured to fit the lateral malleolus.  It was then fixed proximally with three bicortical screws and distally with three unicortical screws.  Final AP, mortise, and lateral views showed appropriate reduction of the fracture and appropriate position and length of the hardware.  The medial malleolus avulsion fracture was also appropriately reduced.  A mortise view was then obtained.  Dorsiflexion and external rotation stress was applied with the forefoot held in varus.  There was no widening of the medial clear space or the syndesmosis.  There was no displacement of the medial malleolar avulsion fracture.  The lateral wound was then irrigated copiously.  Inverted simple sutures of 2-0 Vicryl were used to close the deep subcutaneous tissue over the plate and screws.  The skin incision was then closed with a running 3-0 nylon.  Sterile dressings were applied followed by a well-padded  short- leg splint.  Tourniquet was released at 32 minutes.  The patient was then awakened from anesthesia and transported to the recovery room in stable condition.  FOLLOWUP PLAN:  The patient will be nonweightbearing on the left lower extremity.  She will follow up with me in 2 weeks for suture removal. Since she is a smoker, we will prescribe Xarelto for 2 weeks for DVT prophylaxis and then convert her to an aspirin a day.     Toni Arthurs, MD     JH/MEDQ  D:  05/03/2012  T:  05/04/2012  Job:   [id number]

## 2012-05-07 NOTE — Care Management Note (Signed)
    Page 1 of 1   05/04/2012     8:30:16 AM   CARE MANAGEMENT NOTE 05/04/2012  Patient:  [name], [name]   Account Number:  [id number]  Date Initiated:  05/03/2012  Documentation initiated by:  Abree Romick  Subjective/Objective Assessment:     Action/Plan:   Anticipated DC Date:  05/03/2012   Anticipated DC Plan:  HOME/SELF CARE         Choice offered to / List presented to:     DME arranged  Levan Hurst      DME agency  [facility]        Status of service:   Medicare Important Message given?   (If response is "NO", the following Medicare IM given date fields will be blank) Date Medicare IM given:   Date Additional Medicare IM given:    Discharge Disposition:    Per UR Regulation:    If discussed at Long Length of Stay Meetings, dates discussed:    Comments:  MD requested a rolling walker for pt to be dc'd with. [facility] notified and pt dc'd from short stay.

## 2012-05-07 NOTE — Care Management (Signed)
Late entry- pt left hospital prior to receiving her walker.

## 2012-08-07 ENCOUNTER — Ambulatory Visit: Payer: Self-pay | Attending: Orthopedic Surgery | Admitting: Physical Therapy

## 2012-08-07 DIAGNOSIS — M25579 Pain in unspecified ankle and joints of unspecified foot: Secondary | ICD-10-CM | POA: Insufficient documentation

## 2012-08-07 DIAGNOSIS — M25673 Stiffness of unspecified ankle, not elsewhere classified: Secondary | ICD-10-CM | POA: Insufficient documentation

## 2012-08-07 DIAGNOSIS — R262 Difficulty in walking, not elsewhere classified: Secondary | ICD-10-CM | POA: Insufficient documentation

## 2012-08-07 DIAGNOSIS — M25676 Stiffness of unspecified foot, not elsewhere classified: Secondary | ICD-10-CM | POA: Insufficient documentation

## 2012-08-07 DIAGNOSIS — IMO0001 Reserved for inherently not codable concepts without codable children: Secondary | ICD-10-CM | POA: Insufficient documentation

## 2012-08-13 ENCOUNTER — Ambulatory Visit: Payer: Self-pay | Attending: Orthopedic Surgery | Admitting: Physical Therapy

## 2012-08-13 DIAGNOSIS — M25673 Stiffness of unspecified ankle, not elsewhere classified: Secondary | ICD-10-CM | POA: Insufficient documentation

## 2012-08-13 DIAGNOSIS — M25676 Stiffness of unspecified foot, not elsewhere classified: Secondary | ICD-10-CM | POA: Insufficient documentation

## 2012-08-13 DIAGNOSIS — IMO0001 Reserved for inherently not codable concepts without codable children: Secondary | ICD-10-CM | POA: Insufficient documentation

## 2012-08-13 DIAGNOSIS — R262 Difficulty in walking, not elsewhere classified: Secondary | ICD-10-CM | POA: Insufficient documentation

## 2012-08-13 DIAGNOSIS — M25579 Pain in unspecified ankle and joints of unspecified foot: Secondary | ICD-10-CM | POA: Insufficient documentation

## 2012-08-16 ENCOUNTER — Encounter: Payer: Self-pay | Admitting: Physical Therapy

## 2012-08-20 ENCOUNTER — Ambulatory Visit: Payer: Self-pay | Admitting: Physical Therapy

## 2012-08-23 ENCOUNTER — Ambulatory Visit: Payer: Self-pay | Admitting: Physical Therapy

## 2012-08-27 ENCOUNTER — Ambulatory Visit: Payer: Self-pay | Admitting: Physical Therapy

## 2012-09-03 ENCOUNTER — Ambulatory Visit: Payer: Self-pay | Admitting: Physical Therapy

## 2012-09-14 ENCOUNTER — Ambulatory Visit: Payer: Self-pay | Attending: Orthopedic Surgery | Admitting: Physical Therapy

## 2012-09-14 DIAGNOSIS — IMO0001 Reserved for inherently not codable concepts without codable children: Secondary | ICD-10-CM | POA: Insufficient documentation

## 2012-09-14 DIAGNOSIS — R262 Difficulty in walking, not elsewhere classified: Secondary | ICD-10-CM | POA: Insufficient documentation

## 2012-09-14 DIAGNOSIS — M25673 Stiffness of unspecified ankle, not elsewhere classified: Secondary | ICD-10-CM | POA: Insufficient documentation

## 2012-09-14 DIAGNOSIS — M25579 Pain in unspecified ankle and joints of unspecified foot: Secondary | ICD-10-CM | POA: Insufficient documentation

## 2012-09-14 DIAGNOSIS — M25676 Stiffness of unspecified foot, not elsewhere classified: Secondary | ICD-10-CM | POA: Insufficient documentation

## 2013-08-09 IMAGING — CR DG ANKLE COMPLETE 3+V*L*
3 series · 3 of 3 positions shown · non-contrast
Comparison: None.

CLINICAL DATA: Twisted ankle, pain laterally

LEFT ANKLE COMPLETE - 3+ VIEW

[t ankle joint ap left]
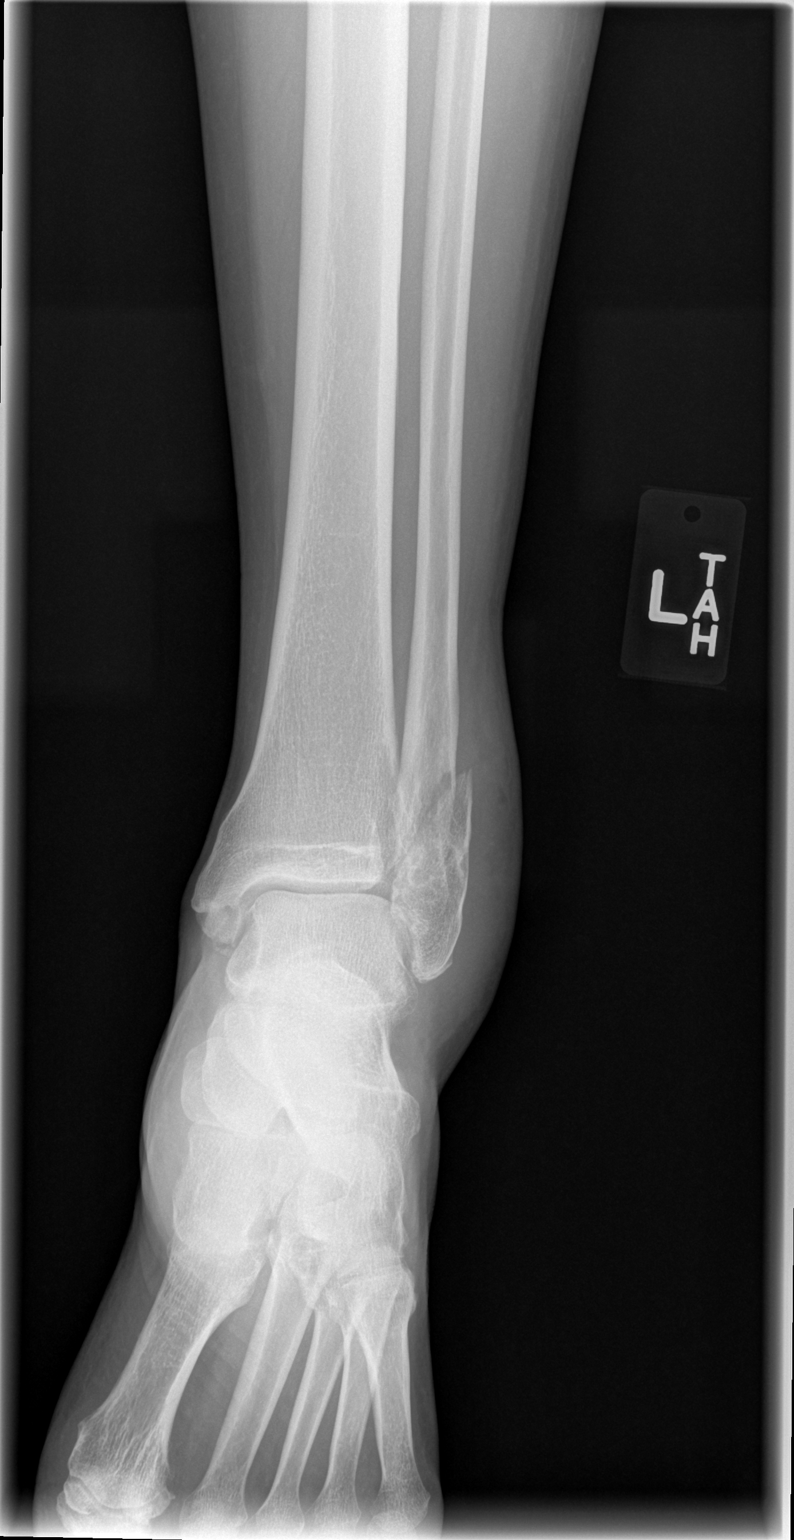

[t ankle joint oblique left]
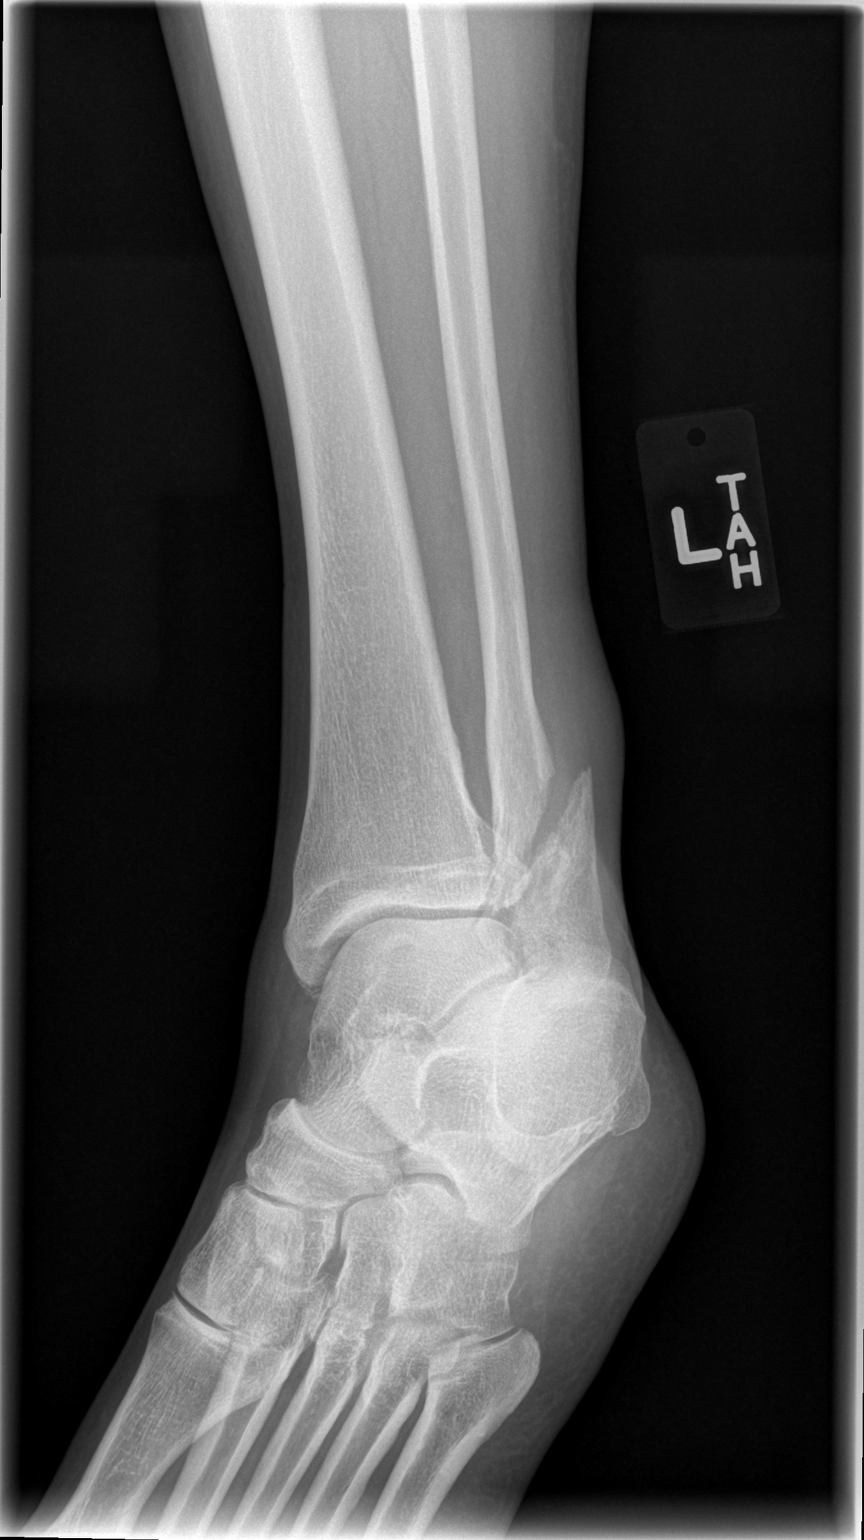

[t ankle joint lat left]
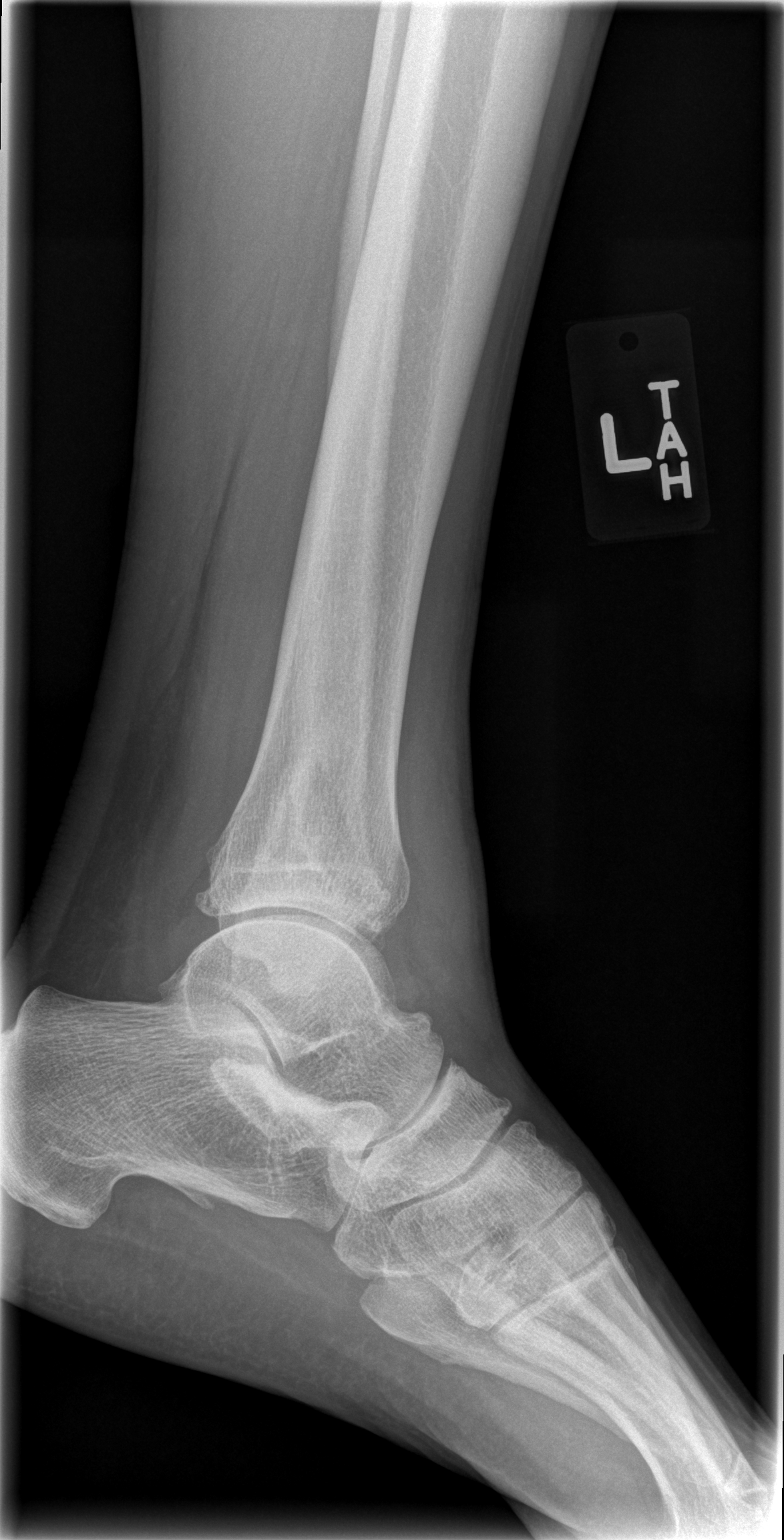

[3 of 3 positions shown; findings below may reference images not displayed]

FINDINGS: There is an oblique displaced fracture of the distal left
fibula with adjacent soft tissue swelling.  In addition there does
appear to be a small fracture from the tip of the medial malleolus.
The ankle joint appears unremarkable.
IMPRESSION: 1.  Oblique fracture of the distal fibula.

2.  Small fracture from the tip of the medial malleolus.

## 2019-06-13 ENCOUNTER — Other Ambulatory Visit: Payer: Self-pay

## 2019-06-13 ENCOUNTER — Ambulatory Visit (HOSPITAL_COMMUNITY)
Admission: EM | Admit: 2019-06-13 | Discharge: 2019-06-13 | Disposition: A | Discharge status: Home / Self Care | Payer: Self-pay | Attending: Family Medicine | Admitting: Family Medicine

## 2019-06-13 ENCOUNTER — Encounter (HOSPITAL_COMMUNITY): Payer: Self-pay | Admitting: Emergency Medicine

## 2019-06-13 DIAGNOSIS — K047 Periapical abscess without sinus: Secondary | ICD-10-CM

## 2019-06-13 MED ORDER — HYDROCODONE-ACETAMINOPHEN 5-325 MG PO TABS: 1.0000 | ORAL_TABLET | Freq: Four times a day (QID) | ORAL | 0 refills | Status: AC | PRN

## 2019-06-13 MED ORDER — LIDOCAINE HCL (PF) 1 % IJ SOLN
INTRAMUSCULAR | Status: AC
  Filled 2019-06-13: qty 2

## 2019-06-13 MED ORDER — KETOROLAC TROMETHAMINE 60 MG/2ML IM SOLN
60.0000 mg | Freq: Once | INTRAMUSCULAR | Status: AC
  Administered 2019-06-13: 60 mg via INTRAMUSCULAR

## 2019-06-13 MED ORDER — KETOROLAC TROMETHAMINE 60 MG/2ML IM SOLN
INTRAMUSCULAR | Status: AC
  Filled 2019-06-13: qty 2

## 2019-06-13 MED ORDER — CEFTRIAXONE SODIUM 1 G IJ SOLR
1.0000 g | Freq: Once | INTRAMUSCULAR | Status: AC
  Administered 2019-06-13: 13:00:00 1 g via INTRAMUSCULAR

## 2019-06-13 MED ORDER — HYDROCODONE-ACETAMINOPHEN 5-325 MG PO TABS
ORAL_TABLET | ORAL | Status: AC
  Filled 2019-06-13: qty 1

## 2019-06-13 MED ORDER — HYDROCODONE-ACETAMINOPHEN 5-325 MG PO TABS
1.0000 | ORAL_TABLET | Freq: Once | ORAL | Status: AC
  Administered 2019-06-13: 13:00:00 1 via ORAL

## 2019-06-13 MED ORDER — AMOXICILLIN-POT CLAVULANATE 875-125 MG PO TABS: 1.0000 | ORAL_TABLET | Freq: Two times a day (BID) | ORAL | 0 refills | Status: DC

## 2019-06-13 MED ORDER — CEFTRIAXONE SODIUM 1 G IJ SOLR
INTRAMUSCULAR | Status: AC
  Filled 2019-06-13: qty 10

## 2019-06-13 NOTE — ED Provider Notes (Signed)
[facility]    CSN: [id number] Arrival date & time: 06/13/19  1020      History   Chief Complaint Chief Complaint  Patient presents with  . Abscess    HPI [name] is a 63 y.o. female.   Patient is a 64 year old female presents today with dental pain, infection and abscess.  Symptoms have been constant worsening since yesterday.  Started with swelling and pain yesterday and then when she woke up this morning swelling was worsening.  The bad tooth is to the right lower mouth and swelling is to the right jaw.  Tender to touch.  She is been taking Tylenol with out much relief.  She has low-grade fever here today and is very tearful.  Has had issues with same tooth before and knows she  needs to have extraction.  No nausea, vomiting, trismus, trouble swallowing, drooling.   ROS per HPI      Past Medical History:  Diagnosis Date  . History of cardiovascular stress test    done in preparation of surgery- 05/01/2012  . 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 Preop cardiovascular exam 04/26/2012  . Chest pain 04/26/2012  . Tachycardia 04/26/2012  . Smoking 04/26/2012    Past Surgical History:  Procedure Laterality Date  . MULTIPLE TOOTH EXTRACTIONS    . ORIF ANKLE FRACTURE  05/03/2012   Procedure: OPEN REDUCTION INTERNAL FIXATION (ORIF) ANKLE FRACTURE;  Surgeon: Toni Arthurs, MD;  Location: [facility];  Service: Orthopedics;  Laterality: Left;    OB History   No obstetric history on file.      Home Medications    Prior to Admission medications   Medication Sig Start Date End Date Taking? Authorizing Provider  acetaminophen (TYLENOL) 500 MG tablet Take 1,000 mg by mouth every 4 (four) hours as needed. For pain   Yes [provider]  amoxicillin-clavulanate (AUGMENTIN) 875-125 MG tablet Take 1 tablet by mouth every 12 (twelve) hours. 06/13/19   Dahlia Byes A, NP   HYDROcodone-acetaminophen (NORCO/VICODIN) 5-325 MG tablet Take 1-2 tablets by mouth every 6 (six) hours as needed for up to 3 days. 06/13/19 06/16/19  Dahlia Byes A, NP  rivaroxaban (XARELTO) 10 MG TABS tablet Take 1 tablet (10 mg total) by mouth daily. 05/03/12 06/13/19  Toni Arthurs, MD    Family History Family History  Problem Relation Age of Onset  . Hypertension Mother   . Diabetes Mother   . Heart disease Sister   . Heart disease Sister     Social History Social History   Tobacco Use  . Smoking status: Current Every Day Smoker    Packs/day: 1.00    Years: 30.00    Pack years: 30.00  Substance Use Topics  . Alcohol use: Yes    Comment: 3x's / month   . Drug use: Yes    Types: Cocaine    Comment: last 3 days ago- 04/29/2012     Allergies   Patient has no known allergies.   Review of Systems Review of Systems   Physical Exam Triage Vital Signs ED Triage Vitals  Enc Vitals Group     BP 06/13/19 1152 (!) 158/113     Pulse Rate 06/13/19 1152 (!) 122     Resp 06/13/19 1152 (!) 26     Temp 06/13/19 1152 99.5 F (37.5 C)  Temp Source 06/13/19 1152 Oral     SpO2 06/13/19 1152 100 %     Weight --      Height --      Head Circumference --      Peak Flow --      Pain Score 06/13/19 1148 10     Pain Loc --      Pain Edu? --      Excl. in [place]? --    No data found.  Updated Vital Signs BP 138/78 (BP Location: Right Arm)   Pulse 94   Temp 99.5 F (37.5 C) (Oral)   Resp 20   SpO2 100%   Visual Acuity Right Eye Distance:   Left Eye Distance:   Bilateral Distance:    Right Eye Near:   Left Eye Near:    Bilateral Near:     Physical Exam Vitals and nursing note reviewed.  Constitutional:      Appearance: Normal appearance. She is normal weight.     Comments: Pt tearful   HENT:     Head: Normocephalic.      Nose: Nose normal.     Mouth/Throat:     Dentition: Dental tenderness, gingival swelling, dental caries and dental abscesses present.      Tongue: Tongue does not deviate from midline.     Pharynx: Oropharynx is clear.      Comments: Pt talking without any difficulty. Mild trismus.  Moderate right lower facial swelling.  Eyes:     Conjunctiva/sclera: Conjunctivae normal.  Pulmonary:     Effort: Pulmonary effort is normal.  Musculoskeletal:        General: Normal range of motion.     Cervical back: Normal range of motion.  Skin:    General: Skin is warm and dry.  Neurological:     Mental Status: She is alert.      UC Treatments / Results  Labs (all labs ordered are listed, but only abnormal results are displayed) Labs Reviewed - No data to display  EKG   Radiology No results found.  Procedures Procedures (including critical care time)  Medications Ordered in UC Medications  ketorolac (TORADOL) injection 60 mg (60 mg Intramuscular Given 06/13/19 1243)  cefTRIAXone (ROCEPHIN) injection 1 g (1 g Intramuscular Given 06/13/19 1244)  HYDROcodone-acetaminophen (NORCO/VICODIN) 5-325 MG per tablet 1 tablet (1 tablet Oral Given 06/13/19 1242)    Initial Impression / Assessment and Plan / UC Course  I have reviewed the triage vital signs and the nursing notes.  Pertinent labs & imaging results that were available during my care of the patient were reviewed by me and considered in my medical decision making (see chart for details).     Dental infection/abscess Treating with rocephin injection, hydrocodone and Toradol here in clinic based on extent of infection.  Sending home with Augmentin and hydrocodone with strict return precautions and ER precautions. Gave warning signs of Ludwig angina.  Pt understanding and agreed.  Pt vital stable on discharge.  Final Clinical Impressions(s) / UC Diagnoses   Final diagnoses:  Dental infection     Discharge Instructions     Treating you for a dental infection.  Medication given here as well as stuff sent home to start taking today.  Over the next 24-78  hours if  symptoms worsen or not seeing any improvement you need to go to the ER.     ED Prescriptions    Medication Sig Dispense Auth. Provider   amoxicillin-clavulanate (AUGMENTIN) 875-125  MG tablet Take 1 tablet by mouth every 12 (twelve) hours. 14 tablet Merelin Human A, NP   HYDROcodone-acetaminophen (NORCO/VICODIN) 5-325 MG tablet Take 1-2 tablets by mouth every 6 (six) hours as needed for up to 3 days. 12 tablet Deigo Alonso A, NP     I have reviewed the PDMP during this encounter.   Janace Aris, NP 06/14/19 1206

## 2019-06-13 NOTE — ED Triage Notes (Addendum)
Patient reports abscess noted this morning, reports abscess between gum and lip on right side of mouth  Patient is tearful and reports having difficulty opening mouth for thermometer, tears running down face.

## 2019-06-13 NOTE — Discharge Instructions (Addendum)
Treating you for a dental infection.  Medication given here as well as stuff sent home to start taking today.  Over the next 24-78  hours if symptoms worsen or not seeing any improvement you need to go to the ER.

## 2019-09-02 ENCOUNTER — Ambulatory Visit: Payer: Self-pay

## 2019-09-23 ENCOUNTER — Ambulatory Visit: Payer: Self-pay

## 2022-05-04 ENCOUNTER — Ambulatory Visit (INDEPENDENT_AMBULATORY_CARE_PROVIDER_SITE_OTHER): Payer: Medicare Other | Admitting: Primary Care

## 2022-08-11 ENCOUNTER — Telehealth (INDEPENDENT_AMBULATORY_CARE_PROVIDER_SITE_OTHER): Payer: 59 | Admitting: Primary Care

## 2022-08-11 ENCOUNTER — Telehealth (INDEPENDENT_AMBULATORY_CARE_PROVIDER_SITE_OTHER): Payer: Self-pay | Admitting: Primary Care

## 2022-08-11 NOTE — Telephone Encounter (Signed)
Spoke with patient about changing her visit to virtual. Patient stated she has MyChart, and that would be fine.

## 2022-08-12 DIAGNOSIS — C801 Malignant (primary) neoplasm, unspecified: Secondary | ICD-10-CM

## 2022-08-12 HISTORY — DX: Malignant (primary) neoplasm, unspecified: C80.1

## 2022-09-01 ENCOUNTER — Ambulatory Visit (INDEPENDENT_AMBULATORY_CARE_PROVIDER_SITE_OTHER): Payer: 59

## 2022-09-01 ENCOUNTER — Other Ambulatory Visit: Payer: Self-pay

## 2022-09-01 ENCOUNTER — Ambulatory Visit (HOSPITAL_COMMUNITY)
Admission: EM | Admit: 2022-09-01 | Discharge: 2022-09-01 | Disposition: A | Discharge status: Home / Self Care | Payer: 59 | Attending: Family Medicine | Admitting: Family Medicine

## 2022-09-01 ENCOUNTER — Emergency Department (HOSPITAL_COMMUNITY): Payer: 59

## 2022-09-01 ENCOUNTER — Encounter (HOSPITAL_COMMUNITY): Payer: Self-pay | Admitting: *Deleted

## 2022-09-01 ENCOUNTER — Inpatient Hospital Stay (HOSPITAL_COMMUNITY)
Admission: EM | Admit: 2022-09-01 | Discharge: 2022-09-03 | DRG: 871 | Disposition: A | Discharge status: Home / Self Care | Payer: 59 | Attending: Family Medicine | Admitting: Family Medicine

## 2022-09-01 ENCOUNTER — Encounter (HOSPITAL_COMMUNITY): Payer: Self-pay

## 2022-09-01 DIAGNOSIS — A419 Sepsis, unspecified organism: Principal | ICD-10-CM

## 2022-09-01 DIAGNOSIS — J189 Pneumonia, unspecified organism: Secondary | ICD-10-CM

## 2022-09-01 DIAGNOSIS — R918 Other nonspecific abnormal finding of lung field: Secondary | ICD-10-CM

## 2022-09-01 DIAGNOSIS — Z1152 Encounter for screening for COVID-19: Secondary | ICD-10-CM

## 2022-09-01 DIAGNOSIS — R053 Chronic cough: Secondary | ICD-10-CM

## 2022-09-01 DIAGNOSIS — R599 Enlarged lymph nodes, unspecified: Secondary | ICD-10-CM | POA: Diagnosis present

## 2022-09-01 DIAGNOSIS — F17210 Nicotine dependence, cigarettes, uncomplicated: Secondary | ICD-10-CM | POA: Diagnosis present

## 2022-09-01 DIAGNOSIS — R042 Hemoptysis: Secondary | ICD-10-CM | POA: Diagnosis present

## 2022-09-01 DIAGNOSIS — Z7901 Long term (current) use of anticoagulants: Secondary | ICD-10-CM

## 2022-09-01 DIAGNOSIS — Z8781 Personal history of (healed) traumatic fracture: Secondary | ICD-10-CM

## 2022-09-01 DIAGNOSIS — R590 Localized enlarged lymph nodes: Secondary | ICD-10-CM | POA: Diagnosis present

## 2022-09-01 DIAGNOSIS — I700 Atherosclerosis of aorta: Secondary | ICD-10-CM | POA: Diagnosis present

## 2022-09-01 DIAGNOSIS — Z833 Family history of diabetes mellitus: Secondary | ICD-10-CM

## 2022-09-01 DIAGNOSIS — IMO0001 Reserved for inherently not codable concepts without codable children: Secondary | ICD-10-CM | POA: Diagnosis present

## 2022-09-01 DIAGNOSIS — Z21 Asymptomatic human immunodeficiency virus [HIV] infection status: Secondary | ICD-10-CM

## 2022-09-01 DIAGNOSIS — K769 Liver disease, unspecified: Secondary | ICD-10-CM | POA: Diagnosis present

## 2022-09-01 DIAGNOSIS — F10139 Alcohol abuse with withdrawal, unspecified: Secondary | ICD-10-CM | POA: Diagnosis not present

## 2022-09-01 DIAGNOSIS — I252 Old myocardial infarction: Secondary | ICD-10-CM

## 2022-09-01 DIAGNOSIS — C3491 Malignant neoplasm of unspecified part of right bronchus or lung: Secondary | ICD-10-CM | POA: Diagnosis present

## 2022-09-01 DIAGNOSIS — I2510 Atherosclerotic heart disease of native coronary artery without angina pectoris: Secondary | ICD-10-CM | POA: Diagnosis present

## 2022-09-01 DIAGNOSIS — Z8249 Family history of ischemic heart disease and other diseases of the circulatory system: Secondary | ICD-10-CM

## 2022-09-01 DIAGNOSIS — J910 Malignant pleural effusion: Secondary | ICD-10-CM | POA: Diagnosis present

## 2022-09-01 DIAGNOSIS — F1410 Cocaine abuse, uncomplicated: Secondary | ICD-10-CM | POA: Diagnosis present

## 2022-09-01 DIAGNOSIS — F17200 Nicotine dependence, unspecified, uncomplicated: Secondary | ICD-10-CM | POA: Diagnosis present

## 2022-09-01 DIAGNOSIS — F10939 Alcohol use, unspecified with withdrawal, unspecified: Secondary | ICD-10-CM | POA: Insufficient documentation

## 2022-09-01 DIAGNOSIS — I3139 Other pericardial effusion (noninflammatory): Secondary | ICD-10-CM | POA: Diagnosis present

## 2022-09-01 DIAGNOSIS — J9611 Chronic respiratory failure with hypoxia: Secondary | ICD-10-CM | POA: Insufficient documentation

## 2022-09-01 DIAGNOSIS — B20 Human immunodeficiency virus [HIV] disease: Secondary | ICD-10-CM | POA: Insufficient documentation

## 2022-09-01 LAB — CBC WITH DIFFERENTIAL/PLATELET
Abs Immature Granulocytes: 0 10*3/uL (ref 0.00–0.07)
Basophils Absolute: 0 10*3/uL (ref 0.0–0.1)
Basophils Relative: 0 %
Eosinophils Absolute: 0 10*3/uL (ref 0.0–0.5)
Eosinophils Relative: 0 %
HCT: 33.9 % — ABNORMAL LOW (ref 36.0–46.0)
Hemoglobin: 11.2 g/dL — ABNORMAL LOW (ref 12.0–15.0)
Lymphocytes Relative: 2 %
Lymphs Abs: 0.4 10*3/uL — ABNORMAL LOW (ref 0.7–4.0)
MCH: 30.9 pg (ref 26.0–34.0)
MCHC: 33 g/dL (ref 30.0–36.0)
MCV: 93.6 fL (ref 80.0–100.0)
Monocytes Absolute: 0.7 10*3/uL (ref 0.1–1.0)
Monocytes Relative: 4 %
Neutro Abs: 17.1 10*3/uL — ABNORMAL HIGH (ref 1.7–7.7)
Neutrophils Relative %: 94 %
Platelets: 349 10*3/uL (ref 150–400)
RBC: 3.62 MIL/uL — ABNORMAL LOW (ref 3.87–5.11)
RDW: 13.4 % (ref 11.5–15.5)
WBC: 18.2 10*3/uL — ABNORMAL HIGH (ref 4.0–10.5)
nRBC: 0 % (ref 0.0–0.2)
nRBC: 0 /100 WBC

## 2022-09-01 LAB — RESP PANEL BY RT-PCR (RSV, FLU A&B, COVID)  RVPGX2
Influenza A by PCR: NEGATIVE
Influenza B by PCR: NEGATIVE
Resp Syncytial Virus by PCR: NEGATIVE
SARS Coronavirus 2 by RT PCR: NEGATIVE

## 2022-09-01 LAB — BASIC METABOLIC PANEL
Anion gap: 8 (ref 5–15)
BUN: 23 mg/dL (ref 8–23)
CO2: 23 mmol/L (ref 22–32)
Calcium: 9.5 mg/dL (ref 8.9–10.3)
Chloride: 100 mmol/L (ref 98–111)
Creatinine, Ser: 1.33 mg/dL — ABNORMAL HIGH (ref 0.44–1.00)
GFR, Estimated: 44 mL/min — ABNORMAL LOW (ref >60)
Glucose, Bld: 136 mg/dL — ABNORMAL HIGH (ref 70–99)
Potassium: 4.5 mmol/L (ref 3.5–5.1)
Sodium: 131 mmol/L — ABNORMAL LOW (ref 135–145)

## 2022-09-01 LAB — LACTIC ACID, PLASMA
Lactic Acid, Venous: 1.7 mmol/L (ref 0.5–1.9)
Lactic Acid, Venous: 1.2 mmol/L (ref 0.5–1.9)

## 2022-09-01 LAB — BRAIN NATRIURETIC PEPTIDE: B Natriuretic Peptide: 97.3 pg/mL (ref 0.0–100.0)

## 2022-09-01 MED ORDER — THIAMINE MONONITRATE 100 MG PO TABS
100.0000 mg | ORAL_TABLET | Freq: Every day | ORAL | Status: DC
  Administered 2022-09-01 – 2022-09-03 (×2): 100 mg via ORAL
  Filled 2022-09-01 (×2): qty 1

## 2022-09-01 MED ORDER — ACETAMINOPHEN 650 MG RE SUPP: 650.0000 mg | Freq: Four times a day (QID) | RECTAL | Status: DC | PRN

## 2022-09-01 MED ORDER — POLYETHYLENE GLYCOL 3350 17 G PO PACK: 17.0000 g | PACK | Freq: Every day | ORAL | Status: DC | PRN

## 2022-09-01 MED ORDER — SODIUM CHLORIDE 0.9 % IV SOLN
500.0000 mg | Freq: Once | INTRAVENOUS | Status: AC
  Administered 2022-09-01: 500 mg via INTRAVENOUS
  Filled 2022-09-01: qty 5

## 2022-09-01 MED ORDER — NICOTINE 21 MG/24HR TD PT24
21.0000 mg | MEDICATED_PATCH | Freq: Every day | TRANSDERMAL | Status: DC
  Administered 2022-09-01 – 2022-09-03 (×3): 21 mg via TRANSDERMAL
  Filled 2022-09-01 (×4): qty 1

## 2022-09-01 MED ORDER — FOLIC ACID 1 MG PO TABS
1.0000 mg | ORAL_TABLET | Freq: Every day | ORAL | Status: DC
  Administered 2022-09-01 – 2022-09-03 (×2): 1 mg via ORAL
  Filled 2022-09-01 (×3): qty 1

## 2022-09-01 MED ORDER — IBUPROFEN 800 MG PO TABS
ORAL_TABLET | ORAL | Status: AC
  Filled 2022-09-01: qty 1

## 2022-09-01 MED ORDER — PROCHLORPERAZINE EDISYLATE 10 MG/2ML IJ SOLN: 10.0000 mg | Freq: Four times a day (QID) | INTRAMUSCULAR | Status: DC | PRN

## 2022-09-01 MED ORDER — ENOXAPARIN SODIUM 40 MG/0.4ML IJ SOSY
40.0000 mg | PREFILLED_SYRINGE | INTRAMUSCULAR | Status: DC
  Administered 2022-09-02: 40 mg via SUBCUTANEOUS
  Filled 2022-09-01 (×2): qty 0.4

## 2022-09-01 MED ORDER — PROMETHAZINE-CODEINE 6.25-10 MG/5ML PO SYRP
5.0000 mL | ORAL_SOLUTION | Freq: Four times a day (QID) | ORAL | Status: DC | PRN
  Administered 2022-09-02 – 2022-09-03 (×4): 5 mL via ORAL
  Filled 2022-09-01 (×4): qty 5

## 2022-09-01 MED ORDER — THIAMINE HCL 100 MG/ML IJ SOLN: 100.0000 mg | Freq: Every day | INTRAMUSCULAR | Status: DC

## 2022-09-01 MED ORDER — LIDOCAINE 5 % EX PTCH
1.0000 | MEDICATED_PATCH | CUTANEOUS | Status: DC
  Administered 2022-09-02: 1 via TRANSDERMAL
  Filled 2022-09-01 (×2): qty 1

## 2022-09-01 MED ORDER — BENZONATATE 100 MG PO CAPS
100.0000 mg | ORAL_CAPSULE | Freq: Two times a day (BID) | ORAL | Status: DC | PRN
  Administered 2022-09-01: 100 mg via ORAL
  Filled 2022-09-01: qty 1

## 2022-09-01 MED ORDER — ACETAMINOPHEN 325 MG PO TABS
650.0000 mg | ORAL_TABLET | Freq: Once | ORAL | Status: AC
  Administered 2022-09-01: 650 mg via ORAL
  Filled 2022-09-01: qty 2

## 2022-09-01 MED ORDER — ACETAMINOPHEN 325 MG PO TABS
650.0000 mg | ORAL_TABLET | Freq: Four times a day (QID) | ORAL | Status: DC | PRN
  Administered 2022-09-02 – 2022-09-03 (×4): 650 mg via ORAL
  Filled 2022-09-01 (×4): qty 2

## 2022-09-01 MED ORDER — LORAZEPAM 1 MG PO TABS: 1.0000 mg | ORAL_TABLET | ORAL | Status: DC | PRN

## 2022-09-01 MED ORDER — SODIUM CHLORIDE 0.9 % IV SOLN
1.0000 g | Freq: Once | INTRAVENOUS | Status: AC
  Administered 2022-09-01: 1 g via INTRAVENOUS
  Filled 2022-09-01: qty 10

## 2022-09-01 MED ORDER — IBUPROFEN 800 MG PO TABS
800.0000 mg | ORAL_TABLET | Freq: Once | ORAL | Status: AC
  Administered 2022-09-01: 800 mg via ORAL

## 2022-09-01 MED ORDER — SODIUM CHLORIDE 0.9 % IV BOLUS
1000.0000 mL | Freq: Once | INTRAVENOUS | Status: AC
  Administered 2022-09-01: 1000 mL via INTRAVENOUS

## 2022-09-01 MED ORDER — IPRATROPIUM BROMIDE 0.02 % IN SOLN
0.5000 mg | Freq: Four times a day (QID) | RESPIRATORY_TRACT | Status: DC
  Administered 2022-09-02 (×3): 0.5 mg via RESPIRATORY_TRACT
  Filled 2022-09-01 (×3): qty 2.5

## 2022-09-01 MED ORDER — ADULT MULTIVITAMIN W/MINERALS CH
1.0000 | ORAL_TABLET | Freq: Every day | ORAL | Status: DC
  Administered 2022-09-01 – 2022-09-03 (×2): 1 via ORAL
  Filled 2022-09-01 (×3): qty 1

## 2022-09-01 NOTE — Assessment & Plan Note (Addendum)
Resolved. CIWA discontinued

## 2022-09-01 NOTE — Discharge Instructions (Signed)
Please proceed to the ER for further evaluation and treatment.

## 2022-09-01 NOTE — ED Triage Notes (Addendum)
Pt states she has cough, congestion and some SOB due to coughing X 2 weeks. She has been taking OTC meds as needed. She states she took tylneol at 5am.

## 2022-09-01 NOTE — Progress Notes (Signed)
Family medicine teaching service will be admitting this patient. Our pager information can be located in the physician sticky notes, treatment team sticky notes, and the headers of all our official daily progress notes.   FAMILY MEDICINE TEACHING SERVICE Patient - Please contact intern pager [phone] or text page via website [email or web address]) for questions regarding care. DO NOT page listed attending provider unless there is no answer from the number above.   Lajuan Godbee, MD PGY-3, [place] pager [phone]   

## 2022-09-01 NOTE — ED Provider Notes (Signed)
[place]    CSN: [id number] Arrival date & time: 09/01/22  [id number]      History   Chief Complaint Chief Complaint  Patient presents with   Cough   Nasal Congestion    HPI [name] is a 67 y.o. female.    Cough  Here for cough and congestion.  Symptoms began at least 10 days ago.  She states that she has had fever "off and on".  She knows for sure that she began having fever last night and it bothered her overnight and this morning.  Also she is feeling short of breath at the time of exam, and she states that has come and gone.  For sure it was noted when she awoke this morning.  No vomiting or diarrhea.  No history of asthma or COPD.  She has had some pleuritic chest pain on the right lower chest posteriorly and laterally.  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      Home Medications    Prior to Admission medications   Medication Sig Start Date End Date Taking? Authorizing Provider  acetaminophen (TYLENOL) 500 MG tablet Take 1,000 mg by mouth every 4 (four) hours as needed. For pain    [provider]  rivaroxaban (XARELTO) 10 MG TABS tablet Take 1 tablet (10 mg total) by mouth daily. 05/03/12 06/13/19  Wylene Simmer, MD    Family History Family History  Problem Relation Age of Onset    Hypertension Mother    Diabetes Mother    Heart disease Sister    Heart disease Sister     Social History Social History   Tobacco Use   Smoking status: Every Day    Packs/day: 1.00    Years: 30.00    Additional pack years: 0.00    Total pack years: 30.00    Types: Cigarettes  Vaping Use   Vaping Use: Never used  Substance Use Topics   Alcohol use: Yes    Comment: 3x's / month    Drug use: Not Currently    Types: Cocaine    Comment: 04/29/2012     Allergies   Patient has no known allergies.   Review of Systems Review of Systems  Respiratory:  Positive for cough.      Physical Exam Triage Vital Signs ED Triage Vitals  Enc Vitals Group     BP 09/01/22 0958 (!) 171/85     Pulse Rate 09/01/22 0958 (!) 149     Resp 09/01/22 0958 20     Temp 09/01/22 0958 (!) 103 F (39.4 C)     Temp Source 09/01/22 0958 Oral     SpO2 09/01/22 0958 98 %     Weight --      Height --  Head Circumference --      Peak Flow --      Pain Score 09/01/22 0956 0     Pain Loc --      Pain Edu? --      Excl. in [place]? --    No data found.  Updated Vital Signs BP (!) 171/85 (BP Location: Left Arm)   Pulse (!) 149   Temp 98.5 F (36.9 C)   Resp 20   SpO2 98%   Visual Acuity Right Eye Distance:   Left Eye Distance:   Bilateral Distance:    Right Eye Near:   Left Eye Near:    Bilateral Near:     Physical Exam Vitals reviewed.  Constitutional:      General: She is not in acute distress.    Appearance: She is not toxic-appearing.  HENT:     Nose: Nose normal.     Mouth/Throat:     Mouth: Mucous membranes are moist.     Comments: There is clear mucus draining in OP Eyes:     Extraocular Movements: Extraocular movements intact.     Conjunctiva/sclera: Conjunctivae normal.     Pupils: Pupils are equal, round, and reactive to light.  Cardiovascular:     Rate and Rhythm: Normal rate and regular rhythm.     Heart sounds: No murmur heard. Pulmonary:     Effort:  Pulmonary effort is normal. No respiratory distress.     Breath sounds: No stridor. No wheezing, rhonchi or rales.  Musculoskeletal:     Cervical back: Neck supple.  Lymphadenopathy:     Cervical: No cervical adenopathy.  Skin:    Capillary Refill: Capillary refill takes less than 2 seconds.     Coloration: Skin is not jaundiced or pale.  Neurological:     General: No focal deficit present.     Mental Status: She is alert and oriented to person, place, and time.  Psychiatric:        Behavior: Behavior normal.      UC Treatments / Results  Labs (all labs ordered are listed, but only abnormal results are displayed) Labs Reviewed - No data to display  EKG   Radiology DG Chest 2 View  Result Date: 09/01/2022 CLINICAL DATA:  Chronic cough.  History of smoking EXAM: CHEST - 2 VIEW COMPARISON:  02/18/2011 FINDINGS: Heart size within normal limits. Mass-like opacity in the right mid lung field measuring approximately 6.5 x 4.5 cm. Enlargement of the right hilum and right paratracheal stripe suggesting of underlying adenopathy. Streaky interstitial opacities throughout the right mid to lower lung field. Small to moderate right-sided pleural effusion. Left lung is clear. IMPRESSION: Mass-like opacity in the right mid lung field measuring approximately 6.5 x 4.5 cm. Enlargement of the right hilum and right paratracheal stripe suggesting underlying adenopathy. Findings are most concerning for malignancy. Chest CT with IV contrast is recommended for further evaluation. These results will be called to the ordering clinician or representative by the Radiologist Assistant, and communication documented in the PACS or [employer]. Electronically Signed   By: Davina Poke D.O.   On: 09/01/2022 12:13    Procedures Procedures (including critical care time)  Medications Ordered in UC Medications  ibuprofen (ADVIL) tablet 800 mg (800 mg Oral Given 09/01/22 1010)    Initial Impression /  Assessment and Plan / UC Course  I have reviewed the triage vital signs and the nursing notes.  Pertinent labs & imaging results that were available during my  care of the patient were reviewed by me and considered in my medical decision making (see chart for details).        There is a mass in the right midlung, and also enlargement in the right hilum that could be adenopathy.  I do wonder if she has postobstructive pneumonia also.  Since she needs a CT, she is sent to the emergency room by private car for further evaluation and treatment. Final Clinical Impressions(s) / UC Diagnoses   Final diagnoses:  Mass of middle lobe of right lung  Pneumonia of right middle lobe due to infectious organism     Discharge Instructions      Please proceed to the ER for further evaluation and treatment.     ED Prescriptions   None    PDMP not reviewed this encounter.   Barrett Henle, MD 09/01/22 [id number]

## 2022-09-01 NOTE — H&P (Addendum)
Hospital Admission History and Physical Service Pager: [phone]  Patient name: [name] Medical record number: [id number] Date of Birth: 03/08/1956 Age: 67 y.o. Gender: female  Primary Care Provider: Pcp, No Consultants: PCCM Code Status: Full code  Preferred Emergency Contact:   Simon Rhein (or Lacinda Axon, may have switched last name) [phone]  Contact Information     Name Relation Home Work Mobile   Grewell,Richard Spouse [phone]        Chief Complaint: SOB   Assessment and Plan: [name] is a 67 y.o. female presenting with shortness of breath and hemoptysis. Differential for this patient's presentation of this includes malignancy, infection, COPD exacerbation.  With imaging most likely this is malignant in nature although will need biopsies to confirm.  Also appears to be infectious with fever and elevated white count.  With her chronic history of smoking suspect she does have underlying chronic disease with COPD although has never been formally tested and is not on any inhalers at home.  * Lung mass Mass-like opacity in the right mid lung field measuring approximately 6.5 x 4.5 cm w/ enlargement of the right hilum and right paratracheal stripe suggesting underlying adenopathy noted on CXR. Most likely malignancy with smoking history. PCCM consulted for evaluation/possible biopsy. Patient also has  low-density hepatic lesion  and lytic lesion at T2, concerning for metastatic disease. Patient had reduced breath sounds/poor aeration of lung fields, will treat w/ nebulizer to see if she improves.  - Admit to FMTS, attending Dr. Erin Hearing - PCCM following, to see tomorrow - Telemetry, Vital signs per floor - Regular diet, NPO at midnight  - PT/OT to treat - VTE prophylaxis: Lovenox  - AM CBC/BMP  - Incentive Spirometer  - Wean oxygen as tolerated  - Atrovent nebs Q6H, avoiding albuterol for tachycardia - Tessalon and Codeine PRN for cough  - Lidocaine patch  for rib discomfort when coughing   Sepsis ([place]) On admission, she was febrile to 103, tachycardic to 120s, w/ WBC of 18.2, meeting sepsis criteria given lung imaging. Tachycardic on exam most likely 2/2 to infection. Appears HR is trending down, suspect she will continue to improve once pressures are stabilized and antibiotics are continued.  - Fluids: s/p 1 L NS bolus  - Follow blood cultures  - Trend fever curve  - Monitor Blood pressures, may need more fluids if pressures remain low, dry-euvolemic on exam Antibiotics:   - Rocephin (3/21 - )  - Azithromycin (3/21 - )  Smoking Smokes 1 ppd for 52 years.  - Nicotine patch ordered  - Smoking cessation education   Alcohol withdrawal ([place]) Patient reports drinking heavily at home. Last drink was 3/20. She denies prior withdrawals and reports cutting back the last 2 weeks since she has gotten sick.  - CIWA Q6H  - Ativan per CIWA protocol     FEN/GI: NPO midnight, pending possible pulm intervention VTE Prophylaxis: Lovenox   Disposition: med-surg  History of Present Illness:  [name] is a 67 y.o. female presenting from urgent care. Presented to urgent care due to having SOB and a fever with intermittent hemoptysis for >2 weeks. She also reports having a decreased appetite for several months.  She denies abdominal pain, irregular bowel movements, diarrhea.  She has made a point to avoid hospitals and doctors offices and is only been seen by physician for an ankle fracture back in 2016.  She is on no medications at home drinks frequently and smokes 1  pack/day of cigarettes.  In the ED, she was febrile to 103, tachycardic to 120s. WBC of 18.2. CT chest showed findings concerning for widespread malignant disease with a loculated pleural effusion.  Admitted for antibiotics and PCCM consult.  Day team paged pulmonary and they are adding her to the list to see tomorrow.  Review Of Systems: Per HPI with the following additions: As above    Pertinent Past Medical History: Ankle fracture  Remainder reviewed in history tab.   Pertinent Past Surgical History: Multiple tooth extractions  ORIF Ankle Fracture 2013  Remainder reviewed in history tab.   Pertinent Social History: Tobacco use: Current, smokes 1 ppd since she was 59  Alcohol use: Current, drinks 2-3 12oz cups of liquor (vodka) every other day  Other Substance use: Hx of cocaine abuse  Lives by herself, is able to perform all of her ADLs  Pertinent Family History: Mother: HTN, diabetes  Sister: heart diseaes  Remainder reviewed in history tab.   Important Outpatient Medications: None  Remainder reviewed in medication history.   Objective: BP 114/71 (BP Location: Left Arm)   Pulse (!) 117   Temp 98.6 F (37 C) (Oral)   Resp (!) 22   Ht 5\' 9"  (1.753 m)   Wt 94.3 kg   SpO2 91%   BMI 30.70 kg/m  Exam: Acutely ill-appearing, no acute distress Cardio: Regular rate, regular rhythm, no murmurs on exam. Pulm: Comfortably working on exam.  No air movement on right lung bases with crackles heard upper lung on right side.  Prolonged expiratory phase on left side diffusely with coarse air movement Abdominal: bowel sounds present, soft, non-tender, non-distended Extremities: no peripheral edema  Neuro: alert and oriented x3, speech normal in content, no facial asymmetry  Labs:  CBC BMET  Recent Labs  Lab 09/01/22 1342  WBC 18.2*  HGB 11.2*  HCT 33.9*  PLT 349   Recent Labs  Lab 09/01/22 1342  NA 131*  K 4.5  CL 100  CO2 23  BUN 23  CREATININE 1.33*  GLUCOSE 136*  CALCIUM 9.5    Pertinent additional labs BNP 97.3.   EKG: sinus tachycardia, no ST-elevations, Qtc 425    Imaging Studies Performed:  CXR:  Mass-like opacity in the right mid lung field measuring approximately 6.5 x 4.5 cm. Enlargement of the right hilum and right paratracheal stripe suggesting underlying adenopathy. Findings are most concerning for malignancy. Chest CT with  IV contrast is recommended for further evaluation.  CT Chest WO Contrast:  1. Findings suspicious for bronchogenic neoplasm in the RIGHT chest associated with multifocal pulmonary involvement and bulky mediastinal adenopathy including LEFT paratracheal adenopathy. 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 2. Loculated pleural fluid likely explained by presence of neoplasm with malignant effusion. Would also correlate with any signs of infection. 3. Irregular pericardial thickening is suggested as well. 4. Top-normal bilateral axillary nodal tissue most of which retains fatty hila. These are nonspecific. Given extensive disease in the chest the patient may benefit from PET imaging for further staging when the patient is able. 5. Small pericardial effusion, question pericardial thickening on noncontrast imaging. 6. Subtle indistinct areas of nodularity in the LEFT lower lobe measuring 6 x 5 mm. Could indicate developing multifocal neoplasm also involving the LEFT chest. 7. Low-density hepatic lesion not well evaluated. Further evaluation with dedicated abdominal imaging or PET when the patient is able is suggested. 8. Lytic lesion at T2 could reflect early bony metastatic disease. 9. Three-vessel coronary artery  disease and aortic atherosclerosis.  Darci Current, DO 09/01/2022, 8:51 PM PGY-1, [facility] Intern pager: [phone], text pages welcome Secure chat group [facility] Upper-Level Resident Addendum   I have interviewed and examined the patient w/ the resident. I have discussed the above with the original author and agree with their documentation. Please see also any attending notes.   Holley Bouche, MD PGY-2, [facility] 09/01/2022 10:10 PM  FPTS Service pager: [phone] (text pages welcome through [facility])

## 2022-09-01 NOTE — ED Provider Triage Note (Signed)
Emergency Medicine Provider Triage Evaluation Note  [name] , a 67 y.o. female  was evaluated in triage.  Pt complains of possible pneumonia.  Patient reports 10 days of cough and congestion with intermittent fever.  Patient also complains of intermittent dyspnea.  Patient with urgent care earlier today which noted a mass in the right midlung, enlargement in the right hilum that could be adenopathy.  Urgent care also question possibility of postobstructive pneumonia.  Sent to emergency room for CT, further evaluation and treatment  Review of Systems  Positive: As above Negative: As above  Physical Exam  BP 103/77   Pulse (!) 120   Temp 98.2 F (36.8 C) (Oral)   Resp 18   SpO2 93%  Gen:   Awake, no distress   Resp:  Normal effort  MSK:   Moves extremities without difficulty  Other:    Medical Decision Making  Medically screening exam initiated at 12:47 PM.  Appropriate orders placed.  Irene Shipper was informed that the remainder of the evaluation will be completed by another provider, this initial triage assessment does not replace that evaluation, and the importance of remaining in the ED until their evaluation is complete.     Dorothyann Peng, PA-C 09/01/22 1248

## 2022-09-01 NOTE — ED Triage Notes (Signed)
Pt was sent by UC for an abnormal chest x-ray showing either pneumonia vs mass and told her chest needed a chest CT. Pt c/o SOB and coughing up blood and blood coming out of nosex2wks

## 2022-09-01 NOTE — Hospital Course (Addendum)
[name] is a 67 y.o.female with a history of tobacco use who was admitted to the [facility] Teaching Service at [facility] for SOB. Her hospital course is detailed below:  SOB 2/2 to Lung Mass:  Presented to the ED with 2-week history of shortness of breath, hemoptysis, decreased appetite. In the ED, she was febrile to 103, tachycardic to 120s. WBC of 18.2. CT chest showed findings concerning for widespread malignant disease with a loculated pleural effusion.  Admitted for antibiotics and PCCM consult.  On 3/22 pulmonology was consulted and patient underwent a bronchoscopy.  Procedure showed a large paratracheal mass that was then biopsied.  Pulmonology scheduled outpatient follow-up to review results.  - Awaiting quant GOLD  Positive HIV screen: Admission HIV screen was reactive.  CD4 T-helper cells reduced at 311, 35%. - Awaiting HIV 1/2 AB differentiation and HIV-1 RNA quant results.  Sepsis:  On admission she was tachycardic, febrile with a elevated WBC meeting sepsis criteria.  She was started on Rocephin and azithromycin 3/21, given 1 L bolus with vital sign improvement.  On 3/23 patient was transition to Augmentin BID x 10 days. She was scheduled for outpatient follow-up with pulmonology. Recommended to establish with a PCP.   Alcohol Abuse:  On admission patient reported drinking large volumes of alcohol.  She was placed on CIWA protocols for 48 hours.  Patient CIWA's remained low and she did not receive any Ativan.  Discharged home with substance abuse counseling.   Other chronic conditions were medically managed with home medications and formulary alternatives as necessary.  PCP Follow-up Recommendations:  Outpatient bronchoscopy results with Pulmonology Positive HIV screen. Awaiting HIV 1/2 AB differentiation and HIV-1 RNA quant results. Will need referral to outpatient ID.  Quant GOLD screen still pending at time of discharge. PCP or other provider to follow up.  Low-density  hepatic lesion not well evaluated. Further evaluation with dedicated abdominal imaging or PET when the patient is able is suggested. Lytic lesion at T2 could reflect early bony metastatic disease. Await PET to eval.  Three-vessel coronary artery disease and aortic atherosclerosis. PCP to obtain risk stratification labs including lipids, A1c, etc.

## 2022-09-01 NOTE — Assessment & Plan Note (Addendum)
Evaluated by PCCM.  S/p bronchoscopy.  Follow-up with pulmonology outpatient scheduled. -PCCM s/o -PT/OT following -Wean oxygen as tolerated  -Atrovent nebs Q6H, avoiding albuterol for tachycardia -Tessalon and Codeine PRN for cough  -Lidocaine patch for rib discomfort when coughing

## 2022-09-01 NOTE — ED Notes (Signed)
ED TO INPATIENT HANDOFF REPORT  ED Nurse Name and Phone #:  Jonelle Sidle [place] Name/Age/Gender [name] 67 y.o. female Room/Bed: 011C/011C  Code Status   Code Status: Not on file  Home/SNF/Other Home Patient oriented to: self, place, time, and situation Is this baseline? Yes   Triage Complete: Triage complete  Chief Complaint Lung mass [R91.8]  Triage Note Pt was sent by UC for an abnormal chest x-ray showing either pneumonia vs mass and told her chest needed a chest CT. Pt c/o SOB and coughing up blood and blood coming out of [id number]   Allergies No Known Allergies  Level of Care/Admitting Diagnosis ED Disposition     ED Disposition  Admit   Condition  --   Comment  Hospital Area: [facility] [[id number]]  Level of Care: Med-Surg [16]  May place patient in observation at 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   signed out ama-has never gone back to have worked up   Past Surgical History:  Procedure Laterality Date   MULTIPLE TOOTH EXTRACTIONS     ORIF ANKLE FRACTURE  05/03/2012   Procedure: OPEN REDUCTION INTERNAL FIXATION (ORIF) ANKLE FRACTURE;  Surgeon: Wylene Simmer, MD;  Location: [facility];  Service: Orthopedics;  Laterality: Left;     A IV Location/Drains/Wounds Patient Lines/Drains/Airways Status     Active Line/Drains/Airways     Name Placement date Placement time Site Days   Peripheral IV 09/01/22 20 G Left Antecubital 09/01/22  1858  Antecubital  less than 1   Incision 05/03/12 Leg Left 05/03/12   1037  -- 3773            Intake/Output Last 24 hours No intake or output data in the 24 hours ending 09/01/22 1910  Labs/Imaging Results for orders placed or performed during the hospital encounter of 09/01/22 (from the past 48 hour(s))  Basic metabolic panel     Status: Abnormal   Collection Time: 09/01/22  1:42 PM  Result Value Ref Range   Sodium 131 (L) 135 - 145 mmol/L   Potassium 4.5 3.5 - 5.1 mmol/L   Chloride 100 98 - 111 mmol/L   CO2 23 22 - 32 mmol/L   Glucose, Bld 136 (H) 70 - 99 mg/dL    Comment: Glucose reference range applies only to samples taken after fasting for at least 8 hours.   BUN 23 8 - 23 mg/dL   Creatinine, Ser 1.33 (H) 0.44 - 1.00 mg/dL   Calcium 9.5 8.9 - 10.3 mg/dL   GFR, Estimated 44 (L) >60 mL/min    Comment: (NOTE) Calculated using the CKD-EPI Creatinine Equation (2021)    Anion gap 8 5 - 15    Comment: Performed at [facility] [address] [place], [place] [address]  CBC with Differential     Status: Abnormal   Collection Time: 09/01/22  1:42 PM  Result Value Ref Range   WBC 18.2 (H) 4.0 - 10.5 K/uL   RBC 3.62 (L) 3.87 - 5.11 MIL/uL  Hemoglobin 11.2 (L) 12.0 - 15.0 g/dL   HCT 33.9 (L) 36.0 - 46.0 %   MCV 93.6 80.0 - 100.0 fL   MCH 30.9 26.0 - 34.0 pg   MCHC 33.0 30.0 - 36.0 g/dL   RDW 13.4 11.5 - 15.5 %   Platelets 349 150 - 400 K/uL   nRBC 0.0 0.0 - 0.2 %   Neutrophils Relative % 94 %   Neutro Abs 17.1 (H) 1.7 - 7.7 K/uL   Lymphocytes Relative 2 %   Lymphs Abs 0.4 (L) 0.7 - 4.0 K/uL   Monocytes Relative 4 %   Monocytes Absolute 0.7 0.1 - 1.0 K/uL   Eosinophils Relative 0 %   Eosinophils Absolute 0.0 0.0 - 0.5 K/uL   Basophils Relative 0 %   Basophils Absolute 0.0 0.0 - 0.1 K/uL   nRBC 0 0 /100 WBC   Abs Immature Granulocytes 0.00 0.00 - 0.07 K/uL   Polychromasia PRESENT     Comment: Performed at [facility], [place] [address], [place], [place] [address]  Brain natriuretic peptide     Status: None   Collection  Time: 09/01/22  1:42 PM  Result Value Ref Range   B Natriuretic Peptide 97.3 0.0 - 100.0 pg/mL    Comment: Performed at [facility] [address], [place], [place] [address]   CT Chest Wo Contrast  Result Date: 09/01/2022 CLINICAL DATA:  67 year old female presents with suspected pneumonia. * Tracking Code: BO * EXAM: CT CHEST WITHOUT CONTRAST TECHNIQUE: Multidetector CT imaging of the chest was performed following the standard protocol without IV contrast. RADIATION DOSE REDUCTION: This exam was performed according to the departmental dose-optimization program which includes automated exposure control, adjustment of the mA and/or kV according to patient size and/or use of iterative reconstruction technique. COMPARISON:  None available. FINDINGS: Cardiovascular: Small pericardial effusion, question pericardial thickening on noncontrast imaging. Heart size is normal. Three-vessel coronary artery disease. Calcified aortic atherosclerosis without aneurysmal dilation. Central pulmonary vasculature is normal caliber though obscured by RIGHT hilar mass. Limited assessment of vascular structures and cardiac structures due to lack of intravenous contrast. Mediastinum/Nodes: Bulky RIGHT paratracheal nodal mass measuring 3.6 x 4.6 cm (image 55/3) This is nearly confluent with additional nodal tissue that measures up to 2.8 cm on image 46/3. Image 71/3 subcarinal adenopathy measuring 1.7 cm. Bulky RIGHT hilar adenopathy measuring up to 2.2 cm greatest thickness. This is confluent with airspace disease in the RIGHT chest. Scattered bilateral axillary lymph nodes mainly less than a cm, those above a cm show fatty hila. Lungs/Pleura: 35/4, 1.6 x 1.5 cm RIGHT upper lobe nodule. Image 81/4 4.6 x 4.8 cm RIGHT middle lobe airspace disease with infiltrative appearance and surrounding diffuse septal thickening. Distortion and nodularity of the minor fissure and loculated appearing area in the major fissure on image 85/4  showing lower density and measuring 4.8 x 3.8 cm. Other loculated areas of pleural fluid in the RIGHT chest are also noted with small to moderate volume overall with loculated sub pulmonic component as the largest area of discrete pleural fluid in the RIGHT chest. Subtle indistinct areas of nodularity in the LEFT lower lobe 6 x 5 mm (image 74/4) airways are patent. Upper Abdomen: Low-density lesion in the LEFT hepatic lobe measures 9 mm. Area shows low-density, perhaps water density though image noise makes assessment difficult. Adrenal glands are incompletely assessed, no gross mass. No signs of upper abdominal adenopathy. Musculoskeletal: No acute musculoskeletal process. Spinal degenerative changes. Osteopenia.  Lytic area in the T2 vertebral body measuring 4-5 mm. Otherwise no destructive bone findings are noted IMPRESSION: 1. Findings suspicious for bronchogenic neoplasm in the RIGHT chest associated with multifocal pulmonary involvement and bulky mediastinal adenopathy including LEFT paratracheal adenopathy. 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 2. Loculated pleural fluid likely explained by presence of neoplasm with malignant effusion. Would also correlate with any signs of infection. 3. Irregular pericardial thickening is suggested as well. 4. Top-normal bilateral axillary nodal tissue most of which retains fatty hila. These are nonspecific. Given extensive disease in the chest the patient may benefit from PET imaging for further staging when the patient is able. 5. Small pericardial effusion, question pericardial thickening on noncontrast imaging. 6. Subtle indistinct areas of nodularity in the LEFT lower lobe measuring 6 x 5 mm. Could indicate developing multifocal neoplasm also involving the LEFT chest. 7. Low-density hepatic lesion not well evaluated. Further evaluation with dedicated abdominal imaging or PET when the patient is able is suggested. 8. Lytic lesion at T2 could reflect  early bony metastatic disease. 9. Three-vessel coronary artery disease and aortic atherosclerosis. Aortic Atherosclerosis (ICD10-I70.0). Electronically Signed   By: Zetta Bills M.D.   On: 09/01/2022 13:58   DG Chest 2 View  Result Date: 09/01/2022 CLINICAL DATA:  Chronic cough.  History of smoking EXAM: CHEST - 2 VIEW COMPARISON:  02/18/2011 FINDINGS: Heart size within normal limits. Mass-like opacity in the right mid lung field measuring approximately 6.5 x 4.5 cm. Enlargement of the right hilum and right paratracheal stripe suggesting of underlying adenopathy. Streaky interstitial opacities throughout the right mid to lower lung field. Small to moderate right-sided pleural effusion. Left lung is clear. IMPRESSION: Mass-like opacity in the right mid lung field measuring approximately 6.5 x 4.5 cm. Enlargement of the right hilum and right paratracheal stripe suggesting underlying adenopathy. Findings are most concerning for malignancy. Chest CT with IV contrast is recommended for further evaluation. These results will be called to the ordering clinician or representative by the Radiologist Assistant, and communication documented in the PACS or [employer]. Electronically Signed   By: Davina Poke D.O.   On: 09/01/2022 12:13    Pending Labs Unresulted Labs (From admission, onward)     Start     Ordered   09/01/22 1800  Resp panel by RT-PCR (RSV, Flu A&B, Covid)  (Resp panel by RT-PCR (RSV, Flu A&B, Covid))  Once,   URGENT        09/01/22 1759   09/01/22 1754  Culture, blood (routine x 2)  BLOOD CULTURE X 2,   R      09/01/22 1753   09/01/22 1754  Lactic acid, plasma  Now then every 2 hours,   R      09/01/22 1753            Vitals/Pain Today's Vitals   09/01/22 1243 09/01/22 1257 09/01/22 1619  BP: 103/77  (!) 126/108  Pulse: (!) 120  76  Resp: 18  16  Temp: 98.2 F (36.8 C)  98.5 F (36.9 C)  TempSrc: Oral    SpO2: 93%  96%  Weight:  94.3 kg   Height:  5\' 9"  (1.753 m)    PainSc:  0-No pain     Isolation Precautions No active isolations  Medications Medications  cefTRIAXone (ROCEPHIN) 1 g in sodium chloride 0.9 % 100 mL IVPB (has no administration in time range)  azithromycin (ZITHROMAX) 500 mg in sodium chloride 0.9 % 250 mL IVPB (has  no administration in time range)  lidocaine (LIDODERM) 5 % 1 patch (has no administration in time range)  benzonatate (TESSALON) capsule 100 mg (has no administration in time range)  promethazine-codeine (PHENERGAN with CODEINE) 6.25-10 MG/5ML syrup 5 mL (has no administration in time range)  LORazepam (ATIVAN) tablet 1-4 mg (has no administration in time range)  thiamine (VITAMIN B1) tablet 100 mg (has no administration in time range)    Or  thiamine (VITAMIN B1) injection 100 mg (has no administration in time range)  folic acid (FOLVITE) tablet 1 mg (has no administration in time range)  multivitamin with minerals tablet 1 tablet (has no administration in time range)  sodium chloride 0.9 % bolus 1,000 mL (1,000 mLs Intravenous New Bag/Given 09/01/22 1901)  acetaminophen (TYLENOL) tablet 650 mg (650 mg Oral Given 09/01/22 1901)    Mobility walks     Focused Assessments   R Recommendations: See Admitting Provider Note  Report given to:   Additional Notes:  Ill start the  antibiotic before coming up. Pt is pleasant.

## 2022-09-01 NOTE — ED Notes (Signed)
Patient is being discharged from the Urgent Care and sent to the Emergency Department via POV . Per Juliane Poot, MD, patient is in need of higher level of care due to lung mass, higher level of care. Patient is aware and verbalizes understanding of plan of care.  Vitals:   09/01/22 0958 09/01/22 1140  BP: (!) 171/85   Pulse: (!) 149   Resp: 20   Temp: (!) 103 F (39.4 C) 98.5 F (36.9 C)  SpO2: 98%

## 2022-09-01 NOTE — Assessment & Plan Note (Addendum)
52 pack year history. -Nicotine patch 21mg 

## 2022-09-01 NOTE — ED Notes (Signed)
Provider made aware of pt's HR.

## 2022-09-01 NOTE — ED Provider Notes (Signed)
[facility] Provider Note   CSN: [id number] Arrival date & time: 09/01/22  1234     History  Chief Complaint  Patient presents with   abnormal chest x-ray    [name] is a 67 y.o. female.  HPI Patient has fever coughing and chest x-ray abnormalities.  Sent from urgent care.  Urgent care x-ray showed potential lung mass and potential infection.  Has been shortness of breath around 2 weeks.  Coughing up blood at times.  Is a smoker.  States she has had a decreased appetite for a while now also.  Does not see a doctor.   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 Authorizing Provider  acetaminophen (TYLENOL) 500 MG tablet Take 1,000 mg by mouth every 4 (four) hours as needed. For pain   Yes [provider]  guaifenesin (ROBITUSSIN) 100 MG/5ML syrup Take 200 mg by mouth 3 (three) times daily as needed for cough.   Yes [provider]  pseudoephedrine (SUDAFED) 30 MG tablet Take 30 mg by mouth every 4 (four) hours as needed for congestion.   Yes [provider]  rivaroxaban (XARELTO) 10 MG TABS tablet Take 1 tablet (10 mg total) by mouth daily. 05/03/12 06/13/19  Wylene Simmer, MD      Allergies    Patient has no known allergies.    Review of Systems   Review of Systems  Physical Exam Updated Vital Signs BP 126/67 (BP Location: Left Arm)   Pulse (!) 110   Temp 99.3 F (37.4 C) (Oral)   Resp 20   Ht 5\' 9"  (1.753 m)   Wt 94.3 kg   SpO2 93%   BMI 30.70 kg/m  Physical Exam Vitals and nursing note reviewed.  HENT:     Head: Normocephalic.  Eyes:     Pupils: Pupils are equal, round, and reactive to light.  Cardiovascular:     Rate and Rhythm:  Regular rhythm. Tachycardia present.  Pulmonary:     Comments: Somewhat harsh breath sounds on right. Abdominal:     Tenderness: There is no abdominal tenderness.  Skin:    General: Skin is warm.  Neurological:     Mental Status: She is alert and oriented to person, place, and time.     ED Results / Procedures / Treatments   Labs (all labs ordered are listed, but only abnormal results are displayed) Labs Reviewed  BASIC METABOLIC PANEL - Abnormal; Notable for the following components:      Result Value   Sodium 131 (*)    Glucose, Bld 136 (*)    Creatinine, Ser 1.33 (*)    GFR, Estimated 44 (*)    All other components within normal limits  CBC WITH DIFFERENTIAL/PLATELET - Abnormal; Notable for the following components:   WBC 18.2 (*)    RBC 3.62 (*)    Hemoglobin 11.2 (*)    HCT 33.9 (*)    Neutro Abs 17.1 (*)    Lymphs Abs 0.4 (*)    All other components within normal limits  RESP PANEL BY RT-PCR (RSV, FLU A&B, COVID)  [id number]  CULTURE, BLOOD (ROUTINE X 2)  CULTURE, BLOOD (ROUTINE X  2)  BRAIN NATRIURETIC PEPTIDE  LACTIC ACID, PLASMA  LACTIC ACID, PLASMA  HIV ANTIBODY (ROUTINE TESTING W REFLEX)  BASIC METABOLIC PANEL  CBC    EKG EKG Interpretation  Date/Time:  Thursday September 01 2022 13:11:24 EDT Ventricular Rate:  131 PR Interval:  116 QRS Duration: 66 QT Interval:  288 QTC Calculation: 425 R Axis:   82 Text Interpretation: Sinus tachycardia with Premature atrial complexes Otherwise normal ECG When compared with ECG of 01-May-2012 10:16,  rate increased Confirmed by Davonna Belling [phone]) on 09/01/2022 5:53:50 PM  Radiology CT Chest Wo Contrast  Result Date: 09/01/2022 CLINICAL DATA:  68 year old female presents with suspected pneumonia. * Tracking Code: BO * EXAM: CT CHEST WITHOUT CONTRAST TECHNIQUE: Multidetector CT imaging of the chest was performed following the standard protocol without IV contrast. RADIATION DOSE REDUCTION: This exam was performed  according to the departmental dose-optimization program which includes automated exposure control, adjustment of the mA and/or kV according to patient size and/or use of iterative reconstruction technique. COMPARISON:  None available. FINDINGS: Cardiovascular: Small pericardial effusion, question pericardial thickening on noncontrast imaging. Heart size is normal. Three-vessel coronary artery disease. Calcified aortic atherosclerosis without aneurysmal dilation. Central pulmonary vasculature is normal caliber though obscured by RIGHT hilar mass. Limited assessment of vascular structures and cardiac structures due to lack of intravenous contrast. Mediastinum/Nodes: Bulky RIGHT paratracheal nodal mass measuring 3.6 x 4.6 cm (image 55/3) This is nearly confluent with additional nodal tissue that measures up to 2.8 cm on image 46/3. Image 71/3 subcarinal adenopathy measuring 1.7 cm. Bulky RIGHT hilar adenopathy measuring up to 2.2 cm greatest thickness. This is confluent with airspace disease in the RIGHT chest. Scattered bilateral axillary lymph nodes mainly less than a cm, those above a cm show fatty hila. Lungs/Pleura: 35/4, 1.6 x 1.5 cm RIGHT upper lobe nodule. Image 81/4 4.6 x 4.8 cm RIGHT middle lobe airspace disease with infiltrative appearance and surrounding diffuse septal thickening. Distortion and nodularity of the minor fissure and loculated appearing area in the major fissure on image 85/4 showing lower density and measuring 4.8 x 3.8 cm. Other loculated areas of pleural fluid in the RIGHT chest are also noted with small to moderate volume overall with loculated sub pulmonic component as the largest area of discrete pleural fluid in the RIGHT chest. Subtle indistinct areas of nodularity in the LEFT lower lobe 6 x 5 mm (image 74/4) airways are patent. Upper Abdomen: Low-density lesion in the LEFT hepatic lobe measures 9 mm. Area shows low-density, perhaps water density though image noise makes assessment  difficult. Adrenal glands are incompletely assessed, no gross mass. No signs of upper abdominal adenopathy. Musculoskeletal: No acute musculoskeletal process. Spinal degenerative changes. Osteopenia. Lytic area in the T2 vertebral body measuring 4-5 mm. Otherwise no destructive bone findings are noted IMPRESSION: 1. Findings suspicious for bronchogenic neoplasm in the RIGHT chest associated with multifocal pulmonary involvement and bulky mediastinal adenopathy including LEFT paratracheal adenopathy. 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 2. Loculated pleural fluid likely explained by presence of neoplasm with malignant effusion. Would also correlate with any signs of infection. 3. Irregular pericardial thickening is suggested as well. 4. Top-normal bilateral axillary nodal tissue most of which retains fatty hila. These are nonspecific. Given extensive disease in the chest the patient may benefit from PET imaging for further staging when the patient is able. 5. Small pericardial effusion, question pericardial thickening on noncontrast imaging. 6. Subtle indistinct areas of nodularity in the LEFT lower lobe measuring 6 x  5 mm. Could indicate developing multifocal neoplasm also involving the LEFT chest. 7. Low-density hepatic lesion not well evaluated. Further evaluation with dedicated abdominal imaging or PET when the patient is able is suggested. 8. Lytic lesion at T2 could reflect early bony metastatic disease. 9. Three-vessel coronary artery disease and aortic atherosclerosis. Aortic Atherosclerosis (ICD10-I70.0). Electronically Signed   By: Zetta Bills M.D.   On: 09/01/2022 13:58   DG Chest 2 View  Result Date: 09/01/2022 CLINICAL DATA:  Chronic cough.  History of smoking EXAM: CHEST - 2 VIEW COMPARISON:  02/18/2011 FINDINGS: Heart size within normal limits. Mass-like opacity in the right mid lung field measuring approximately 6.5 x 4.5 cm. Enlargement of the right hilum and right  paratracheal stripe suggesting of underlying adenopathy. Streaky interstitial opacities throughout the right mid to lower lung field. Small to moderate right-sided pleural effusion. Left lung is clear. IMPRESSION: Mass-like opacity in the right mid lung field measuring approximately 6.5 x 4.5 cm. Enlargement of the right hilum and right paratracheal stripe suggesting underlying adenopathy. Findings are most concerning for malignancy. Chest CT with IV contrast is recommended for further evaluation. These results will be called to the ordering clinician or representative by the Radiologist Assistant, and communication documented in the PACS or [employer]. Electronically Signed   By: Davina Poke D.O.   On: 09/01/2022 12:13    Procedures Procedures    Medications Ordered in ED Medications  azithromycin (ZITHROMAX) 500 mg in sodium chloride 0.9 % 250 mL IVPB (has no administration in time range)  lidocaine (LIDODERM) 5 % 1 patch (1 patch Transdermal Patient Refused/Not Given 09/01/22 2230)  benzonatate (TESSALON) capsule 100 mg (100 mg Oral Given 09/01/22 1943)  promethazine-codeine (PHENERGAN with CODEINE) 6.25-10 MG/5ML syrup 5 mL (has no administration in time range)  LORazepam (ATIVAN) tablet 1-4 mg (has no administration in time range)  thiamine (VITAMIN B1) tablet 100 mg (100 mg Oral Given 09/01/22 1943)    Or  thiamine (VITAMIN B1) injection 100 mg ( Intravenous See Alternative [id number])  folic acid (FOLVITE) tablet 1 mg (1 mg Oral Given 09/01/22 1943)  multivitamin with minerals tablet 1 tablet (1 tablet Oral Given 09/01/22 1943)  enoxaparin (LOVENOX) injection 40 mg (40 mg Subcutaneous Patient Refused/Not Given 09/01/22 2230)  acetaminophen (TYLENOL) tablet 650 mg (has no administration in time range)    Or  acetaminophen (TYLENOL) suppository 650 mg (has no administration in time range)  polyethylene glycol (MIRALAX / GLYCOLAX) packet 17 g (has no administration in time range)   prochlorperazine (COMPAZINE) injection 10 mg (has no administration in time range)  ipratropium (ATROVENT) nebulizer solution 0.5 mg (has no administration in time range)  nicotine (NICODERM CQ - dosed in mg/24 hours) patch 21 mg (21 mg Transdermal Patch Applied 09/01/22 2230)  cefTRIAXone (ROCEPHIN) 1 g in sodium chloride 0.9 % 100 mL IVPB (1 g Intravenous New Bag/Given 09/01/22 1944)  sodium chloride 0.9 % bolus 1,000 mL (1,000 mLs Intravenous New Bag/Given 09/01/22 1901)  acetaminophen (TYLENOL) tablet 650 mg (650 mg Oral Given 09/01/22 1901)    ED Course/ Medical Decision Making/ A&P                             Medical Decision Making Amount and/or Complexity of Data Reviewed Labs: ordered.  Risk OTC drugs. Decision regarding hospitalization.   Patient sent in for potential infection and lung mass.  Smoker.  Differential diagnosis includes pneumonia, tumor, cancer.  CT scan done prior to my seeing patient and reportedly does show likely pneumonia.  However other also has potential pneumonia and potential infected effusion.  Will give antibiotics.  Blood work does show leukocytosis.  Does not have a primary care doctor and with the tachycardia and fever I feel she would benefit from admission to the hospital.  Added lactic acid on give fluid bolus.  Blood pressure has been maintained.  Will discuss with unassigned medicine which is family practice at this time.  CRITICAL CARE Performed by: Davonna Belling Total critical care time: 30 minutes Critical care time was exclusive of separately billable procedures and treating other patients. Critical care was necessary to treat or prevent imminent or life-threatening deterioration. Critical care was time spent personally by me on the following activities: development of treatment plan with patient and/or surrogate as well as nursing, discussions with consultants, evaluation of patient's response to treatment, examination of patient, obtaining  history from patient or surrogate, ordering and performing treatments and interventions, ordering and review of laboratory studies, ordering and review of radiographic studies, pulse oximetry and re-evaluation of patient's condition.         Final Clinical Impression(s) / ED Diagnoses Final diagnoses:  Lung mass  Pneumonia of right lung due to infectious organism, unspecified part of lung    Rx / DC Orders ED Discharge Orders     None         Davonna Belling, MD 09/01/22 2316

## 2022-09-01 NOTE — Assessment & Plan Note (Addendum)
Bp stable. Continues to be tachycardic, asymptomatic. Afebrile. -CTX and Azithromycin for CAP coverage - F/u blood cultures

## 2022-09-01 NOTE — Progress Notes (Signed)
FMTS Interim Progress Note  S: Saw patient ahead of night admitting team. She reports back and right lateral chest wall pain with cough. Asks for warm blanket. No dyspnea aside from coughing fits. Denies fever, chills.   Smoking approx 0.75 - 1 PPD since age of 67 years old.   Heavy ETOH use: every other day will drink two or three 12 ounce cups of liquor, usually vodka. Denies alcohol abuse/dependence. Denies h/o tremors, hallucinations, or seizures with cutting back.   Knows about possibility of lung cancer.  Reports that it does not scare her as everyone in her family has had cancer before.  She says "do what you gotta do, boo" regarding tissue sampling.   O: BP (!) 126/108 (BP Location: Right Arm)   Pulse 76   Temp 98.5 F (36.9 C)   Resp 16   Ht 5\' 9"  (1.753 m)   Wt 94.3 kg   SpO2 96%   BMI 30.70 kg/m     A/P: Night team to admit.   Cough: Promethazine-codeine syrup Tessalon Perles  Pain: Tylenol 650 mg Lidocaine patch  Possibility for alcohol withdrawal: CIWA protocol with Ativan as needed  Temp orders placed.  Night team to consult pulm to get on consult list for tomorrow.  Ezequiel Essex, MD 09/01/2022, 6:53 PM PGY-3, [facility] pager [phone]

## 2022-09-02 ENCOUNTER — Observation Stay (HOSPITAL_COMMUNITY): Payer: 59 | Admitting: Anesthesiology

## 2022-09-02 ENCOUNTER — Ambulatory Visit (HOSPITAL_COMMUNITY): Admit: 2022-09-02 | Payer: 59 | Admitting: Pulmonary Disease

## 2022-09-02 ENCOUNTER — Encounter (HOSPITAL_COMMUNITY)
Admission: EM | Disposition: A | Discharge status: Home / Self Care | Payer: Self-pay | Source: Home / Self Care | Attending: Family Medicine

## 2022-09-02 ENCOUNTER — Encounter (HOSPITAL_COMMUNITY): Payer: Self-pay | Admitting: Family Medicine

## 2022-09-02 DIAGNOSIS — Z833 Family history of diabetes mellitus: Secondary | ICD-10-CM | POA: Diagnosis not present

## 2022-09-02 DIAGNOSIS — J9611 Chronic respiratory failure with hypoxia: Secondary | ICD-10-CM | POA: Diagnosis not present

## 2022-09-02 DIAGNOSIS — I252 Old myocardial infarction: Secondary | ICD-10-CM

## 2022-09-02 DIAGNOSIS — I3139 Other pericardial effusion (noninflammatory): Secondary | ICD-10-CM | POA: Diagnosis present

## 2022-09-02 DIAGNOSIS — K769 Liver disease, unspecified: Secondary | ICD-10-CM | POA: Diagnosis present

## 2022-09-02 DIAGNOSIS — Z8781 Personal history of (healed) traumatic fracture: Secondary | ICD-10-CM | POA: Diagnosis not present

## 2022-09-02 DIAGNOSIS — F17210 Nicotine dependence, cigarettes, uncomplicated: Secondary | ICD-10-CM | POA: Diagnosis present

## 2022-09-02 DIAGNOSIS — Z1152 Encounter for screening for COVID-19: Secondary | ICD-10-CM | POA: Diagnosis not present

## 2022-09-02 DIAGNOSIS — R599 Enlarged lymph nodes, unspecified: Secondary | ICD-10-CM | POA: Diagnosis present

## 2022-09-02 DIAGNOSIS — A419 Sepsis, unspecified organism: Secondary | ICD-10-CM | POA: Diagnosis present

## 2022-09-02 DIAGNOSIS — R918 Other nonspecific abnormal finding of lung field: Secondary | ICD-10-CM

## 2022-09-02 DIAGNOSIS — F10139 Alcohol abuse with withdrawal, unspecified: Secondary | ICD-10-CM | POA: Diagnosis not present

## 2022-09-02 DIAGNOSIS — I700 Atherosclerosis of aorta: Secondary | ICD-10-CM | POA: Diagnosis present

## 2022-09-02 DIAGNOSIS — J189 Pneumonia, unspecified organism: Secondary | ICD-10-CM | POA: Diagnosis present

## 2022-09-02 DIAGNOSIS — F1410 Cocaine abuse, uncomplicated: Secondary | ICD-10-CM | POA: Diagnosis present

## 2022-09-02 DIAGNOSIS — Z21 Asymptomatic human immunodeficiency virus [HIV] infection status: Secondary | ICD-10-CM | POA: Diagnosis not present

## 2022-09-02 DIAGNOSIS — C3491 Malignant neoplasm of unspecified part of right bronchus or lung: Secondary | ICD-10-CM | POA: Diagnosis present

## 2022-09-02 DIAGNOSIS — Z8249 Family history of ischemic heart disease and other diseases of the circulatory system: Secondary | ICD-10-CM | POA: Diagnosis not present

## 2022-09-02 DIAGNOSIS — D649 Anemia, unspecified: Secondary | ICD-10-CM | POA: Diagnosis not present

## 2022-09-02 DIAGNOSIS — Z7901 Long term (current) use of anticoagulants: Secondary | ICD-10-CM | POA: Diagnosis not present

## 2022-09-02 DIAGNOSIS — R590 Localized enlarged lymph nodes: Secondary | ICD-10-CM | POA: Diagnosis present

## 2022-09-02 DIAGNOSIS — I2510 Atherosclerotic heart disease of native coronary artery without angina pectoris: Secondary | ICD-10-CM | POA: Diagnosis present

## 2022-09-02 DIAGNOSIS — J910 Malignant pleural effusion: Secondary | ICD-10-CM | POA: Diagnosis present

## 2022-09-02 DIAGNOSIS — R042 Hemoptysis: Secondary | ICD-10-CM | POA: Diagnosis present

## 2022-09-02 HISTORY — PX: VIDEO BRONCHOSCOPY WITH ENDOBRONCHIAL ULTRASOUND: SHX6177

## 2022-09-02 HISTORY — PX: BRONCHIAL NEEDLE ASPIRATION BIOPSY: SHX5106

## 2022-09-02 LAB — T-HELPER CELLS (CD4) COUNT (NOT AT ARMC)
CD4 % Helper T Cell: 35 % (ref 33–65)
CD4 T Cell Abs: 311 /uL — ABNORMAL LOW (ref 400–1790)

## 2022-09-02 LAB — HEPATIC FUNCTION PANEL
ALT: 20 U/L (ref 0–44)
AST: 24 U/L (ref 15–41)
Albumin: 1.8 g/dL — ABNORMAL LOW (ref 3.5–5.0)
Alkaline Phosphatase: 69 U/L (ref 38–126)
Bilirubin, Direct: 0.2 mg/dL (ref 0.0–0.2)
Indirect Bilirubin: 0.5 mg/dL (ref 0.3–0.9)
Total Bilirubin: 0.7 mg/dL (ref 0.3–1.2)
Total Protein: 8.6 g/dL — ABNORMAL HIGH (ref 6.5–8.1)

## 2022-09-02 LAB — BASIC METABOLIC PANEL
Anion gap: 9 (ref 5–15)
BUN: 22 mg/dL (ref 8–23)
CO2: 20 mmol/L — ABNORMAL LOW (ref 22–32)
Calcium: 8.7 mg/dL — ABNORMAL LOW (ref 8.9–10.3)
Chloride: 105 mmol/L (ref 98–111)
Creatinine, Ser: 0.88 mg/dL (ref 0.44–1.00)
GFR, Estimated: >60 mL/min (ref >60)
Glucose, Bld: 102 mg/dL — ABNORMAL HIGH (ref 70–99)
Potassium: 3.7 mmol/L (ref 3.5–5.1)
Sodium: 134 mmol/L — ABNORMAL LOW (ref 135–145)

## 2022-09-02 LAB — CULTURE, BLOOD (ROUTINE X 2): Culture: NO GROWTH

## 2022-09-02 LAB — CBC
HCT: 27.3 % — ABNORMAL LOW (ref 36.0–46.0)
Hemoglobin: 9.1 g/dL — ABNORMAL LOW (ref 12.0–15.0)
MCH: 31 pg (ref 26.0–34.0)
MCHC: 33.3 g/dL (ref 30.0–36.0)
MCV: 92.9 fL (ref 80.0–100.0)
Platelets: 282 10*3/uL (ref 150–400)
RBC: 2.94 MIL/uL — ABNORMAL LOW (ref 3.87–5.11)
RDW: 13.3 % (ref 11.5–15.5)
WBC: 13.2 10*3/uL — ABNORMAL HIGH (ref 4.0–10.5)
nRBC: 0 % (ref 0.0–0.2)

## 2022-09-02 LAB — HIV ANTIBODY (ROUTINE TESTING W REFLEX): HIV Screen 4th Generation wRfx: REACTIVE — AB

## 2022-09-02 SURGERY — BRONCHOSCOPY, WITH EBUS
Anesthesia: General | Laterality: Bilateral

## 2022-09-02 MED ORDER — ACETAMINOPHEN 10 MG/ML IV SOLN: 1000.0000 mg | Freq: Once | INTRAVENOUS | Status: AC

## 2022-09-02 MED ORDER — LACTATED RINGERS IV SOLN: INTRAVENOUS | Status: DC

## 2022-09-02 MED ORDER — LIDOCAINE 2% (20 MG/ML) 5 ML SYRINGE
INTRAMUSCULAR | Status: DC | PRN
  Administered 2022-09-02: 60 mg via INTRAVENOUS

## 2022-09-02 MED ORDER — ONDANSETRON HCL 4 MG/2ML IJ SOLN: 4.0000 mg | Freq: Once | INTRAMUSCULAR | Status: DC | PRN

## 2022-09-02 MED ORDER — SUGAMMADEX SODIUM 200 MG/2ML IV SOLN
INTRAVENOUS | Status: DC | PRN
  Administered 2022-09-02: 400 mg via INTRAVENOUS

## 2022-09-02 MED ORDER — IPRATROPIUM BROMIDE 0.02 % IN SOLN
0.5000 mg | Freq: Four times a day (QID) | RESPIRATORY_TRACT | Status: DC | PRN
  Administered 2022-09-03: 0.5 mg via RESPIRATORY_TRACT
  Filled 2022-09-02: qty 2.5

## 2022-09-02 MED ORDER — ONDANSETRON HCL 4 MG/2ML IJ SOLN
INTRAMUSCULAR | Status: DC | PRN
  Administered 2022-09-02: 4 mg via INTRAVENOUS

## 2022-09-02 MED ORDER — PROPOFOL 10 MG/ML IV BOLUS
INTRAVENOUS | Status: DC | PRN
  Administered 2022-09-02: 30 mg via INTRAVENOUS
  Administered 2022-09-02: 150 mg via INTRAVENOUS
  Administered 2022-09-02: 20 mg via INTRAVENOUS

## 2022-09-02 MED ORDER — FENTANYL CITRATE (PF) 100 MCG/2ML IJ SOLN: 25.0000 ug | INTRAMUSCULAR | Status: DC | PRN

## 2022-09-02 MED ORDER — EPHEDRINE 5 MG/ML INJ
INTRAVENOUS | Status: AC
  Filled 2022-09-02: qty 5

## 2022-09-02 MED ORDER — PROPOFOL 10 MG/ML IV BOLUS
INTRAVENOUS | Status: AC
  Filled 2022-09-02: qty 20

## 2022-09-02 MED ORDER — DEXAMETHASONE SODIUM PHOSPHATE 10 MG/ML IJ SOLN
INTRAMUSCULAR | Status: DC | PRN
  Administered 2022-09-02: 10 mg via INTRAVENOUS

## 2022-09-02 MED ORDER — DEXAMETHASONE SODIUM PHOSPHATE 10 MG/ML IJ SOLN
INTRAMUSCULAR | Status: AC
  Filled 2022-09-02: qty 1

## 2022-09-02 MED ORDER — ONDANSETRON HCL 4 MG/2ML IJ SOLN
INTRAMUSCULAR | Status: AC
  Filled 2022-09-02: qty 2

## 2022-09-02 MED ORDER — ROCURONIUM BROMIDE 10 MG/ML (PF) SYRINGE
PREFILLED_SYRINGE | INTRAVENOUS | Status: DC | PRN
  Administered 2022-09-02: 50 mg via INTRAVENOUS

## 2022-09-02 MED ORDER — OXYCODONE HCL 5 MG/5ML PO SOLN: 5.0000 mg | Freq: Once | ORAL | Status: DC | PRN

## 2022-09-02 MED ORDER — ROCURONIUM BROMIDE 10 MG/ML (PF) SYRINGE
PREFILLED_SYRINGE | INTRAVENOUS | Status: AC
  Filled 2022-09-02: qty 10

## 2022-09-02 MED ORDER — PHENYLEPHRINE 80 MCG/ML (10ML) SYRINGE FOR IV PUSH (FOR BLOOD PRESSURE SUPPORT)
PREFILLED_SYRINGE | INTRAVENOUS | Status: AC
  Filled 2022-09-02: qty 10

## 2022-09-02 MED ORDER — SODIUM CHLORIDE 0.9 % IV SOLN
500.0000 mg | INTRAVENOUS | Status: DC
  Filled 2022-09-02 (×2): qty 5

## 2022-09-02 MED ORDER — SODIUM CHLORIDE 0.9 % IV SOLN
2.0000 g | INTRAVENOUS | Status: DC
  Filled 2022-09-02: qty 20

## 2022-09-02 MED ORDER — LIDOCAINE 2% (20 MG/ML) 5 ML SYRINGE
INTRAMUSCULAR | Status: AC
  Filled 2022-09-02: qty 5

## 2022-09-02 MED ORDER — DICLOFENAC SODIUM 1 % EX GEL
4.0000 g | Freq: Four times a day (QID) | CUTANEOUS | Status: DC
  Administered 2022-09-02 – 2022-09-03 (×3): 4 g via TOPICAL
  Filled 2022-09-02: qty 100

## 2022-09-02 MED ORDER — OXYCODONE HCL 5 MG PO TABS: 5.0000 mg | ORAL_TABLET | Freq: Once | ORAL | Status: DC | PRN

## 2022-09-02 SURGICAL SUPPLY — 29 items

## 2022-09-02 NOTE — Progress Notes (Signed)
PT Cancellation Note  Patient Details Name: [name] MRN: [id number] DOB: 24-Jan-1956   Cancelled Treatment:    Reason Eval/Treat Not Completed: OT screened, no needs identified, will sign off  Tomma Rakers, DPT, [place] Office: [phone] (Secure chat preferred)    Ander Purpura 09/02/2022, 10:52 AM

## 2022-09-02 NOTE — Anesthesia Procedure Notes (Signed)
Procedure Name: Intubation Date/Time: 09/02/2022 2:18 PM  Performed by: Darletta Moll, CRNAPre-anesthesia Checklist: Patient identified, Emergency Drugs available, Suction available and Patient being monitored Patient Re-evaluated:Patient Re-evaluated prior to induction Oxygen Delivery Method: Circle system utilized Preoxygenation: Pre-oxygenation with 100% oxygen Induction Type: IV induction Ventilation: Mask ventilation without difficulty Laryngoscope Size: Mac and 3 Grade View: Grade I Tube type: Oral Tube size: 8.5 mm Number of attempts: 1 Airway Equipment and Method: Stylet and Oral airway Placement Confirmation: ETT inserted through vocal cords under direct vision, positive ETCO2 and breath sounds checked- equal and bilateral Secured at: 23 cm Tube secured with: Tape Dental Injury: Teeth and Oropharynx as per pre-operative assessment

## 2022-09-02 NOTE — Discharge Summary (Shared)
[facility] Discharge Summary  Patient name: [name] Medical record number: [id number] Date of birth: May 12, 1956 Age: 67 y.o. Gender: female Date of Admission: 09/01/2022  Date of Discharge: 09/04/22  Admitting Physician: Lind Covert, MD  Primary Care Provider: Pcp, No Consultants: PCCM   Indication for Hospitalization: SOB and hemoptysis  Principal Problem for Admission: Lung mass, sepsis  Other Problems addressed during stay:  Principal Problem:   Lung mass Active Problems:   Sepsis ([place])   Smoking   Alcohol withdrawal ([place])   HIV (human immunodeficiency virus infection) ([place])   Chronic respiratory failure with hypoxia [facility])  Brief Hospital Course:  [name] is a 67 y.o.female with a history of tobacco use who was admitted to the [facility] Medicine Teaching Service at [facility] for SOB. Her hospital course is detailed below:  SOB 2/2 to Lung Mass:  Presented to the ED with 2-week history of shortness of breath, hemoptysis, decreased appetite. In the ED, she was febrile to 103, tachycardic to 120s. WBC of 18.2. CT chest showed findings concerning for widespread malignant disease with a loculated pleural effusion.  Admitted for antibiotics and PCCM consult.  On 3/22 pulmonology was consulted and patient underwent a bronchoscopy.  Procedure showed a large paratracheal mass that was then biopsied.  Pulmonology scheduled outpatient follow-up to review results.  - Awaiting quant GOLD  Positive HIV screen: Admission HIV screen was reactive.  CD4 T-helper cells reduced at 311, 35%. - Awaiting HIV 1/2 AB differentiation and HIV-1 RNA quant results.  Sepsis:  On admission she was tachycardic, febrile with a elevated WBC meeting sepsis criteria.  She was started on Rocephin and azithromycin 3/21, given 1 L bolus with vital sign improvement.  On 3/23 patient was transition to Augmentin BID x 10 days. She was scheduled for outpatient follow-up  with pulmonology. Recommended to establish with a PCP.   Alcohol Abuse:  On admission patient reported drinking large volumes of alcohol.  She was placed on CIWA protocols for 48 hours.  Patient CIWA's remained low and she did not receive any Ativan.  Discharged home with substance abuse counseling.   Other chronic conditions were medically managed with home medications and formulary alternatives as necessary.  PCP Follow-up Recommendations:  Outpatient bronchoscopy results with Pulmonology Positive HIV screen. Awaiting HIV 1/2 AB differentiation and HIV-1 RNA quant results. Will need referral to outpatient ID.  Quant GOLD screen still pending at time of discharge. PCP or other provider to follow up.  Low-density hepatic lesion not well evaluated. Further evaluation with dedicated abdominal imaging or PET when the patient is able is suggested. Lytic lesion at T2 could reflect early bony metastatic disease. Await PET to eval.  Three-vessel coronary artery disease and aortic atherosclerosis. PCP to obtain risk stratification labs including lipids, A1c, etc.    Disposition: Home with home oxygen  Discharge Condition: Medically stable  Discharge Exam:  Temp:  [96.8 F (36 C)-99.5 F (37.5 C)] 98.2 F (36.8 C) (03/23 0229) Pulse Rate:  [86-112] 86 (03/23 0229) Resp:  [16-29] 20 (03/23 0229) BP: (106-136)/(62-89) 119/65 (03/23 0229) SpO2:  [90 %-100 %] 93 % (03/23 0229) Physical Exam: General: Chronically ill-appearing, no acute distress Cardiovascular: Regular rate, regular rhythm, no murmurs on exam Respiratory: Diminished breath sounds on the right with diffuse coarse breath sounds bilaterally with poor air movement Abdomen: Soft, nontender, nondistended Extremities: No peripheral edema  Significant Procedures: Bronchoscopy 3/22 showing peritracheal mass s/p biopsy  Significant Labs and  Imaging:  Recent Labs  Lab 09/02/22 0350 09/03/22 0208  WBC 13.2* 8.2  HGB 9.1* 9.0*  HCT  27.3* 28.1*  PLT 282 320   Recent Labs  Lab 09/02/22 0350 09/02/22 0942 09/03/22 0208  NA 134*  --  133*  K 3.7  --  4.5  CL 105  --  108  CO2 20*  --  20*  GLUCOSE 102*  --  134*  BUN 22  --  20  CREATININE 0.88  --  0.66  CALCIUM 8.7*  --  8.7*  ALKPHOS  --  69 53  AST  --  24 34  ALT  --  20 23  ALBUMIN  --  1.8* 1.6*   CXR: Masslike opacity in the right midlung field measuring 6.5 x 6.4 cm. Enlargement of the right hilum and right paratracheal stripe suggesting underlying adenopathy. Findings are most concerning for malignancy. Chest CT with IV contrast is recommended for further evaluation.  CT chest without contrast: 1. Findings suspicious for bronchogenic neoplasm in the RIGHT chest associated with multifocal pulmonary involvement and bulky mediastinal adenopathy including LEFT paratracheal adenopathy. 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 2. Loculated pleural fluid likely explained by presence of neoplasm with malignant effusion. Would also correlate with any signs of infection. 3. Irregular pericardial thickening is suggested as well. 4. Top-normal bilateral axillary nodal tissue most of which retains fatty hila. These are nonspecific. Given extensive disease in the chest the patient may benefit from PET imaging for further staging when the patient is able. 5. Small pericardial effusion, question pericardial thickening on noncontrast imaging. 6. Subtle indistinct areas of nodularity in the LEFT lower lobe measuring 6 x 5 mm. Could indicate developing multifocal neoplasm also involving the LEFT chest. 7. Low-density hepatic lesion not well evaluated. Further evaluation with dedicated abdominal imaging or PET when the patient is able is suggested. 8. Lytic lesion at T2 could reflect early bony metastatic disease. 9. Three-vessel coronary artery disease and aortic atherosclerosis.   Results/Tests Pending at Time of Discharge:  Bronchoscopy  biopsy cytology results HIV 1/2 AB differentiation HIV-1 RNA quant results Quant GOLD  Final blood culture results (at time of discharge, no growth 3 days)  Discharge Medications:  Allergies as of 09/03/2022   No Known Allergies      Medication List     STOP taking these medications    pseudoephedrine 30 MG tablet Commonly known as: SUDAFED       TAKE these medications    acetaminophen 500 MG tablet Commonly known as: TYLENOL Take 1,000 mg by mouth every 4 (four) hours as needed. For pain   amoxicillin-clavulanate 875-125 MG tablet Commonly known as: AUGMENTIN Take 1 tablet by mouth every 12 (twelve) hours for 10 days. Start taking on: March 24, 123456   folic acid 1 MG tablet Commonly known as: FOLVITE Take 1 tablet (1 mg total) by mouth daily. Start taking on: September 04, 2022   guaifenesin 100 MG/5ML syrup Commonly known as: ROBITUSSIN Take 200 mg by mouth 3 (three) times daily as needed for cough.   thiamine 100 MG tablet Commonly known as: Vitamin B-1 Take 1 tablet (100 mg total) by mouth daily. Start taking on: September 04, 2022               Durable Medical Equipment  (From admission, onward)           Start     Ordered   09/03/22 1259  For home use  only DME oxygen  Once       Comments: For dyspnea on exertion and as needed.  Question Answer Comment  Length of Need Lifetime   Mode or (Route) Nasal cannula   Liters per Minute 2   Frequency Continuous (stationary and portable oxygen unit needed)   Oxygen conserving device No   Oxygen delivery system Gas      09/03/22 1259           Discharge Instructions: Please refer to Patient Instructions section of EMR for full details.  Patient was counseled important signs and symptoms that should prompt return to medical care, changes in medications, dietary instructions, activity restrictions, and follow up appointments.   Follow-Up Appointments:  Follow-up Information     Curt Bears,  MD. Schedule an appointment as soon as possible for a visit.   Specialty: Oncology Why: The meidcal oncology clinic will likely reach out within a week to schedule follow up with them. Please call directly if you do not hear from them. Contact information: [place] [address] [phone]         Garner Nash, DO. Schedule an appointment as soon as possible for a visit.   Specialty: Pulmonary Disease Why: You will need to follow up in the pulmonology (lung) clinic. They will call you for an appointment. It will likely be Wednesday 3/27 or thursday 3/28. Please call them directly if they do not call your Monday for an appointment. Contact information: [place] [address] [phone]         [facility] Follow up.   Why: [facility] will provider your home oxygen. Contact information: [place] [address] [phone]                 Ezequiel Essex, MD 09/03/2022, 6:30 PM PGY-1, [place]

## 2022-09-02 NOTE — Progress Notes (Signed)
   09/01/22 2033  Assess: MEWS Score  Temp 98.6 F (37 C)  BP 114/71  MAP (mmHg) 84  Pulse Rate (!) 117  Resp (!) 22  SpO2 91 %  O2 Device Nasal Cannula  O2 Flow Rate (L/min) 2 L/min  Assess: MEWS Score  MEWS Temp 0  MEWS Systolic 0  MEWS Pulse 2  MEWS RR 1  MEWS LOC 0  MEWS Score 3  MEWS Score Color Yellow  Assess: if the MEWS score is Yellow or Red  Were vital signs taken at a resting state? Yes  Focused Assessment No change from prior assessment  Does the patient meet 2 or more of the SIRS criteria? Yes  Does the patient have a confirmed or suspected source of infection? Yes  Provider and Rapid Response Notified? No  MEWS guidelines implemented  Yes, yellow  Treat  MEWS Interventions Considered administering scheduled or prn medications/treatments as ordered  Take Vital Signs  Increase Vital Sign Frequency  Yellow: Q2hr x1, continue Q4hrs until patient remains green for 12hrs  Escalate  MEWS: Escalate Yellow: Discuss with charge nurse and consider notifying provider and/or RRT  Notify: Charge Nurse/RN  Name of Charge Nurse/RN Notified Even, RN  Provider Notification  Provider Name/Title Holley Bouche, MD  Date Provider Notified 09/01/22  Time Provider Notified 2045  Method of Notification Call  Notification Reason Other (Comment) (Yellow MEWS)  Provider response No new orders  Time of Provider Response 2050  Assess: SIRS CRITERIA  SIRS Temperature  0  SIRS Pulse 1  SIRS Respirations  1  SIRS WBC 0  SIRS Score Sum  2

## 2022-09-02 NOTE — Anesthesia Preprocedure Evaluation (Addendum)
Anesthesia Evaluation  Patient identified by MRN, date of birth, ID band Patient awake    Reviewed: Allergy & Precautions, NPO status , Patient's Chart, lab work & pertinent test results  History of Anesthesia Complications Negative for: history of anesthetic complications  Airway Mallampati: II  TM Distance: >3 FB Neck ROM: Full    Dental  (+) Dental Advisory Given, Missing, Chipped   Pulmonary Current Smoker and Patient abstained from smoking.  Lung mass    Pulmonary exam normal        Cardiovascular + Past MI  Normal cardiovascular exam     Neuro/Psych negative neurological ROS  negative psych ROS   GI/Hepatic negative GI ROS,,,(+)     substance abuse  alcohol use  Endo/Other  negative endocrine ROS    Renal/GU negative Renal ROS     Musculoskeletal negative musculoskeletal ROS (+)    Abdominal   Peds  Hematology  (+) Blood dyscrasia, anemia   Anesthesia Other Findings   Reproductive/Obstetrics                             Anesthesia Physical Anesthesia Plan  ASA: 3  Anesthesia Plan: General   Post-op Pain Management: Minimal or no pain anticipated   Induction: Intravenous  PONV Risk Score and Plan: 2 and Treatment may vary due to age or medical condition, Ondansetron and Dexamethasone  Airway Management Planned: Oral ETT  Additional Equipment: None  Intra-op Plan:   Post-operative Plan: Extubation in OR  Informed Consent: I have reviewed the patients History and Physical, chart, labs and discussed the procedure including the risks, benefits and alternatives for the proposed anesthesia with the patient or authorized representative who has indicated his/her understanding and acceptance.     Dental advisory given  Plan Discussed with: CRNA and Anesthesiologist  Anesthesia Plan Comments:         Anesthesia Quick Evaluation

## 2022-09-02 NOTE — Transfer of Care (Signed)
Immediate Anesthesia Transfer of Care Note  Patient: [name]  Procedure(s) Performed: VIDEO BRONCHOSCOPY WITH ENDOBRONCHIAL ULTRASOUND (Bilateral) BRONCHIAL NEEDLE ASPIRATION BIOPSIES  Patient Location: Endoscopy Unit  Anesthesia Type:General  Level of Consciousness: drowsy and patient cooperative  Airway & Oxygen Therapy: Patient Spontanous Breathing and Patient connected to nasal cannula oxygen  Post-op Assessment: Report given to RN, Post -op Vital signs reviewed and stable, and Patient moving all extremities X 4  Post vital signs: Reviewed and stable  Last Vitals:  Vitals Value Taken Time  BP    Temp    Pulse 86   Resp 16   SpO2 92     Last Pain:  Vitals:   09/02/22 1330  TempSrc: Temporal  PainSc: 0-No pain         Complications: No notable events documented.

## 2022-09-02 NOTE — Evaluation (Signed)
Occupational Therapy Evaluation Patient Details Name: [name] MRN: [id number] DOB: 12/13/1955 Today's Date: 09/02/2022   History of Present Illness 67 y.o. female presenting with shortness of breath and hemoptysis. Pt was sent by UC for an abnormal chest x-ray showing either pneumonia vs mass and told her a chest CT was needed. Biopsy planning for today.   Clinical Impression   Patient evaluated by Occupational Therapy with no further acute OT needs identified. All education has been completed and the patient has no further questions. Prior to coming to the hospital, pt reports living alone, independent with all ADL tasks and functional mobility. Verbal education provided regarding OOB activity throughout the day with nursing to use the bathroom and sitting up in recliner. Pt verbalized understanding. No follow up OT needs identified or equipment needs. OT is signing off. Thank you for this referral.       Recommendations for follow up therapy are one component of a multi-disciplinary discharge planning process, led by the attending physician.  Recommendations may be updated based on patient status, additional functional criteria and insurance authorization.   Follow Up Recommendations  No OT follow up     Assistance Recommended at Discharge None     Functional Status Assessment  Patient has not had a recent decline in their functional status  Equipment Recommendations  None recommended by OT    Recommendations for Other Services       Precautions / Restrictions Precautions Precautions: Fall Precaution Comments: Watch SpO2 Restrictions Weight Bearing Restrictions: No      Mobility Bed Mobility Overal bed mobility: Independent   Patient Response: Cooperative  Transfers Overall transfer level: Independent Equipment used: None        Balance Overall balance assessment: Independent     ADL either performed or assessed with clinical judgement   ADL Overall  ADL's : Independent              Vision Baseline Vision/History: 0 No visual deficits Ability to See in Adequate Light: 0 Adequate Patient Visual Report: No change from baseline Vision Assessment?: No apparent visual deficits            Pertinent Vitals/Pain Pain Assessment Pain Assessment: Faces Faces Pain Scale: Hurts little more Pain Location: back and right side when coughing Pain Descriptors / Indicators: Grimacing, Guarding Pain Intervention(s): Limited activity within patient's tolerance, Monitored during session, Patient requesting pain meds-RN notified     Hand Dominance Right   Extremity/Trunk Assessment Upper Extremity Assessment Upper Extremity Assessment: Overall WFL for tasks assessed   Lower Extremity Assessment Lower Extremity Assessment: Overall WFL for tasks assessed   Cervical / Trunk Assessment Cervical / Trunk Assessment: Normal   Communication Communication Communication: No difficulties   Cognition Arousal/Alertness: Awake/alert Behavior During Therapy: WFL for tasks assessed/performed Overall Cognitive Status: Within Functional Limits for tasks assessed       General Comments  On RA, SpO2 dropped to 88% on RA. Nasal canula replaced and sats increased to 90% on 2L O2            Home Living Family/patient expects to be discharged to:: Private residence Living Arrangements: Alone   Type of Home: Apartment Home Access: Stairs to enter [employer] of Steps: 2-3   Home Layout: One level     Bathroom Shower/Tub: Teacher, early years/pre:  (Very low toilet per patient)     Home Equipment: None          Prior Functioning/Environment Prior Level  of Function : Independent/Modified Independent;Driving            OT Problem List: Decreased strength      OT Treatment/Interventions:   Eval only   OT Goals(Current goals can be found in the care plan section) Acute Rehab OT Goals Patient Stated Goal: to  get her biopsy done today so she can go home  OT Frequency:  1 time visit       AM-PAC OT "6 Clicks" Daily Activity     Outcome Measure Help from another person eating meals?: None Help from another person taking care of personal grooming?: None Help from another person toileting, which includes using toliet, bedpan, or urinal?: None Help from another person bathing (including washing, rinsing, drying)?: None Help from another person to put on and taking off regular upper body clothing?: None Help from another person to put on and taking off regular lower body clothing?: None 6 Click Score: 24   End of Session Equipment Utilized During Treatment: Oxygen  Activity Tolerance: Patient tolerated treatment well Patient left: in bed;with call bell/phone within reach;with bed alarm set  OT Visit Diagnosis: Muscle weakness (generalized) (M62.81)                Time: [id number] OT Time Calculation (min): 10 min Charges:  OT General Charges $OT Visit: 1 Visit OT Evaluation $OT Eval Low Complexity: 1 Low  Ailene Ravel, OTR/L,CBIS  Supplemental OT - [facility] and [employer] Secure Chat Preferred    Brynden Thune, Clarene Duke 09/02/2022, 10:32 AM

## 2022-09-02 NOTE — Op Note (Signed)
Video Bronchoscopy with Endobronchial Ultrasound Procedure Note  Date of Operation: 09/02/2022  Pre-op Diagnosis: Lung Mass  Post-op Diagnosis: Lung Mass  Surgeon: Garner Nash, DO  Assistants: None   Anesthesia: General endotracheal anesthesia  Operation: Flexible video fiberoptic bronchoscopy with endobronchial ultrasound and biopsies.  Estimated Blood Loss: Minimal  Complications: None   Indications and History: [name] is a 67 y.o. female with lung mass, adenopathy.  The risks, benefits, complications, treatment options and expected outcomes were discussed with the patient.  The possibilities of pneumothorax, pneumonia, reaction to medication, pulmonary aspiration, perforation of a viscus, bleeding, failure to diagnose a condition and creating a complication requiring transfusion or operation were discussed with the patient who freely signed the consent.    Description of Procedure: The patient was examined in the preoperative area and history and data from the preprocedure consultation were reviewed. It was deemed appropriate to proceed.  The patient was taken to [facility], identified as [name] and the procedure verified as Flexible Video Fiberoptic Bronchoscopy.  A Time Out was held and the above information confirmed. After being taken to the operating room general anesthesia was initiated and the patient  was orally intubated. The video fiberoptic bronchoscope was introduced via the endotracheal tube and a general inspection was performed which showed normal right and left lung anatomy . The standard scope was then withdrawn and the endobronchial ultrasound was used to identify and characterize the peritracheal, hilar and bronchial lymph nodes. Inspection showed enlarge paratracheal mass. Using real-time ultrasound guidance Wang needle biopsies were take from large right paratracheal mass and were sent for cytology. The patient tolerated the procedure well without  apparent complications. There was no significant blood loss. The bronchoscope was withdrawn. Anesthesia was reversed and the patient was taken to the PACU for recovery.   Samples: 1. Large right paratracheal mass  Plans:  The patient will be discharged from the PACU to home when recovered from anesthesia. We will review the cytology, pathology results with the patient when they become available. Outpatient followup will be with Garner Nash, DO.    Garner Nash, DO [facility] 09/02/2022 2:37 PM

## 2022-09-02 NOTE — H&P (View-Only) (Signed)
NAME:  [name], MRN:  [id number], DOB:  01-01-1956, LOS: 0 ADMISSION DATE:  09/01/2022, CONSULTATION DATE: 09/02/2022 REFERRING MD: Triad, CHIEF COMPLAINT: 3 weeks of coughing and CT scan suspicious for bronchogenic carcinoma  History of Present Illness:  67 year old long-term smoker half pack to 1 pack/day without toxic exposures who has had a cough for 3 weeks with purulent sputum that is blood-tinged at times.  She presented to hospital with a chief complaint of cough CT of the chest revealed findings suspicious for bronchogenic carcinoma with questionable malignant spread.  Pulmonary critical care asked to evaluate for possible fiberoptic bronchoscopy in the near future.  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 Findings suspicious for bronchogenic neoplasm in the RIGHT chest associated with multifocal pulmonary involvement and bulky mediastinal adenopathy including LEFT paratracheal adenopathy. 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 2. Loculated pleural fluid likely explained by presence of neoplasm with malignant effusion. Would also correlate with any signs of infection. 3. Irregular pericardial thickening is suggested as well. 4. Top-normal bilateral axillary nodal tissue most of which retains fatty hila. These are nonspecific. Given extensive disease in the chest the patient may benefit from PET imaging for further staging when the patient is able. 5. Small pericardial effusion, question pericardial thickening on noncontrast imaging. 6. Subtle indistinct areas of nodularity in the LEFT lower lobe measuring 6 x 5 mm. Could indicate  developing multifocal neoplasm also involving the LEFT chest. 7. Low-density hepatic lesion not well evaluated. Further evaluation with dedicated abdominal imaging or PET when the patient is able is suggested. 8. Lytic lesion at T2 could reflect early bony metastatic disease. 9. Three-vessel coronary artery disease and aortic atherosclerosis.    Interim History / Subjective:  Continues to have cough with purulent sputum  Objective   Blood pressure 134/89, pulse (!) 110, temperature 98.5 F (36.9 C), temperature source Oral, resp. rate 19, height 5\' 9"  (1.753 m), weight 94.3 kg, SpO2 93 %.        Intake/Output Summary (Last 24 hours) at 09/02/2022 0912 Last data filed at 09/01/2022 2300 Gross per 24 hour  Intake 240 ml  Output --  Net 240 ml   Filed Weights   09/01/22 1257  Weight: 94.3 kg    Examination: General: 67 year old female no acute distress HENT: No JVD is present notable with multiple missing teeth Lungs: Coarse rhonchi bilaterally explosive cough rhonchus Cardiovascular: Heart sounds are regular mild tenderness right ribs Abdomen: Soft nontender positive bowel sounds Extremities: Without edema Neuro: Grossly intact   Resolved Hospital Problem list     Assessment & Plan:  Pulmonary consult for 3 weeks of coughing with purulent sputum blood-tinged with CT scan of the chest with findings suspicious for bronchogenic neoplasm in the right chest.  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 Agree with antimicrobial therapy She will need fiberoptic bronchoscopy for tissue diagnosis although there is a mention of hepatic lesion that with further scanning may prove adequate for tissue diagnosis. She can follow-up in the  office by Dr. Valeta Harms with fiberoptic bronchoscopy in the future.  Reactive HIV test per  primary    Best Practice (right click and "Reselect all SmartList Selections" daily)   Diet/type: Regular consistency (see orders) DVT prophylaxis: LMWH GI prophylaxis: N/A Lines: N/A Foley:  N/A Code Status:  full code Last date of multidisciplinary goals of care discussion tbd  Labs   CBC: Recent Labs  Lab 09/01/22 1342 09/02/22 0350  WBC 18.2* 13.2*  NEUTROABS 17.1*  --   HGB 11.2* 9.1*  HCT 33.9* 27.3*  MCV 93.6 92.9  PLT 349 [id number]    Basic Metabolic Panel: Recent Labs  Lab 09/01/22 1342 09/02/22 0350  NA 131* 134*  K 4.5 3.7  CL 100 105  CO2 23 20*  GLUCOSE 136* 102*  BUN 23 22  CREATININE 1.33* 0.88  CALCIUM 9.5 8.7*   GFR: Estimated Creatinine Clearance: 77.9 mL/min (by C-G formula based on SCr of 0.88 mg/dL). Recent Labs  Lab 09/01/22 1342 09/01/22 1754 09/01/22 2049 09/02/22 0350  WBC 18.2*  --   --  13.2*  LATICACIDVEN  --  1.7 1.2  --     Liver Function Tests: No results for input(s): "AST", "ALT", "ALKPHOS", "BILITOT", "PROT", "ALBUMIN" in the last 168 hours. No results for input(s): "LIPASE", "AMYLASE" in the last 168 hours. No results for input(s): "AMMONIA" in the last 168 hours.  ABG    Component Value Date/Time   TCO2 24 10/08/2009 1019     Coagulation Profile: No results for input(s): "INR", "PROTIME" in the last 168 hours.  Cardiac Enzymes: No results for input(s): "CKTOTAL", "CKMB", "CKMBINDEX", "TROPONINI" in the last 168 hours.  HbA1C: Hgb A1c MFr Bld  Date/Time Value Ref Range Status  02/19/2011 05:00 AM 5.6 <5.7 % Final    Comment:    (NOTE)                                                                       According to the ADA Clinical Practice Recommendations for 2011, when HbA1c is used as a screening test:  >=6.5%   Diagnostic of Diabetes Mellitus           (if abnormal result is confirmed) 5.7-6.4%   Increased risk of developing Diabetes Mellitus References:Diagnosis and Classification of Diabetes  [place],Diabetes [id number] 1):S62-S69 and Standards of Medical Care in         Diabetes - 2011,Diabetes [id number] (Suppl 1):S11-S61.    CBG: No results for input(s): "GLUCAP" in the last 168 hours.  Review of Systems:   10 point review of system taken, please see HPI for positives and negatives. History of cough with sputum production blood-tinged at times.  Complains of chest pain at the site of coughing.  Denies fevers chills sweats.  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   Surgical History:   Past Surgical History:  Procedure Laterality Date   MULTIPLE TOOTH EXTRACTIONS     ORIF ANKLE FRACTURE  05/03/2012   Procedure: OPEN REDUCTION INTERNAL FIXATION (ORIF) ANKLE FRACTURE;  Surgeon: Wylene Simmer, MD;  Location: [place];  Service: Orthopedics;  Laterality: Left;     Social History:  reports that she has been smoking cigarettes. She has a 30.00 pack-year smoking history. She does not have any smokeless tobacco history on file. She reports current alcohol use. She reports that she does not currently use drugs after having used the following drugs: Cocaine.   Family History:  Her family history includes Diabetes in her mother; Heart disease in her sister and sister; Hypertension in her mother.   Allergies No Known Allergies   Home Medications  Prior to Admission medications   Medication Sig Start Date End Date Taking? Authorizing Provider  acetaminophen (TYLENOL) 500 MG tablet Take 1,000 mg by mouth every 4 (four) hours as needed. For pain   Yes [provider]  guaifenesin (ROBITUSSIN) 100 MG/5ML syrup Take 200 mg by mouth 3 (three) times daily as needed for cough.   Yes [provider]  pseudoephedrine (SUDAFED) 30 MG tablet Take 30 mg by mouth every 4 (four) hours as needed for congestion.   Yes [provider]  rivaroxaban (XARELTO) 10 MG TABS tablet Take 1  tablet (10 mg total) by mouth daily. 05/03/12 06/13/19  Wylene Simmer, MD     Critical care time: Ferol Luz Deroy Noah ACNP Acute Care Nurse Practitioner [place] Please consult Amion 09/02/2022, 9:12 AM

## 2022-09-02 NOTE — Progress Notes (Signed)
Ok to get HIV VL per Dr. Jerilee Hoh since it would come back faster than HIV differential.  Onnie Boer, PharmD, BCIDP, AAHIVP, CPP Infectious Disease Pharmacist 09/02/2022 2:25 PM

## 2022-09-02 NOTE — Consult Note (Signed)
NAME:  [name], MRN:  [id number], DOB:  [birth year], LOS: 0 ADMISSION DATE:  09/01/2022, CONSULTATION DATE: 09/02/2022 REFERRING MD: Triad, CHIEF COMPLAINT: 3 weeks of coughing and CT scan suspicious for bronchogenic carcinoma  History of Present Illness:  67 year old long-term smoker half pack to 1 pack/day without toxic exposures who has had a cough for 3 weeks with purulent sputum that is blood-tinged at times.  She presented to hospital with a chief complaint of cough CT of the chest revealed findings suspicious for bronchogenic carcinoma with questionable malignant spread.  Pulmonary critical care asked to evaluate for possible fiberoptic bronchoscopy in the near future.  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 Findings suspicious for bronchogenic neoplasm in the RIGHT chest associated with multifocal pulmonary involvement and bulky mediastinal adenopathy including LEFT paratracheal adenopathy. 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 2. Loculated pleural fluid likely explained by presence of neoplasm with malignant effusion. Would also correlate with any signs of infection. 3. Irregular pericardial thickening is suggested as well. 4. Top-normal bilateral axillary nodal tissue most of which retains fatty hila. These are nonspecific. Given extensive disease in the chest the patient may benefit from PET imaging for further staging when the patient is able. 5. Small pericardial effusion, question pericardial thickening on noncontrast imaging. 6. Subtle indistinct areas of nodularity in the LEFT lower lobe measuring 6 x 5 mm. Could indicate  developing multifocal neoplasm also involving the LEFT chest. 7. Low-density hepatic lesion not well evaluated. Further evaluation with dedicated abdominal imaging or PET when the patient is able is suggested. 8. Lytic lesion at T2 could reflect early bony metastatic disease. 9. Three-vessel coronary artery disease and aortic atherosclerosis.    Interim History / Subjective:  Continues to have cough with purulent sputum  Objective   Blood pressure 134/89, pulse (!) 110, temperature 98.5 F (36.9 C), temperature source Oral, resp. rate 19, height 5\' 9"  (1.753 m), weight 94.3 kg, SpO2 93 %.        Intake/Output Summary (Last 24 hours) at 09/02/2022 0912 Last data filed at 09/01/2022 2300 Gross per 24 hour  Intake 240 ml  Output --  Net 240 ml   Filed Weights   09/01/22 1257  Weight: 94.3 kg    Examination: General: 67 year old female no acute distress HENT: No JVD is present notable with multiple missing teeth Lungs: Coarse rhonchi bilaterally explosive cough rhonchus Cardiovascular: Heart sounds are regular mild tenderness right ribs Abdomen: Soft nontender positive bowel sounds Extremities: Without edema Neuro: Grossly intact   Resolved Hospital Problem list     Assessment & Plan:  Pulmonary consult for 3 weeks of coughing with purulent sputum blood-tinged with CT scan of the chest with findings suspicious for bronchogenic neoplasm in the right chest.  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 Agree with antimicrobial therapy She will need fiberoptic bronchoscopy for tissue diagnosis although there is a mention of hepatic lesion that with further scanning may prove adequate for tissue diagnosis. She can follow-up in the  office by Dr. Valeta Harms with fiberoptic bronchoscopy in the future.  Reactive HIV test per  primary    Best Practice (right click and "Reselect all SmartList Selections" daily)   Diet/type: Regular consistency (see orders) DVT prophylaxis: LMWH GI prophylaxis: N/A Lines: N/A Foley:  N/A Code Status:  full code Last date of multidisciplinary goals of care discussion tbd  Labs   CBC: Recent Labs  Lab 09/01/22 1342 09/02/22 0350  WBC 18.2* 13.2*  NEUTROABS 17.1*  --   HGB 11.2* 9.1*  HCT 33.9* 27.3*  MCV 93.6 92.9  PLT 349 [id number]    Basic Metabolic Panel: Recent Labs  Lab 09/01/22 1342 09/02/22 0350  NA 131* 134*  K 4.5 3.7  CL 100 105  CO2 23 20*  GLUCOSE 136* 102*  BUN 23 22  CREATININE 1.33* 0.88  CALCIUM 9.5 8.7*   GFR: Estimated Creatinine Clearance: 77.9 mL/min (by C-G formula based on SCr of 0.88 mg/dL). Recent Labs  Lab 09/01/22 1342 09/01/22 1754 09/01/22 2049 09/02/22 0350  WBC 18.2*  --   --  13.2*  LATICACIDVEN  --  1.7 1.2  --     Liver Function Tests: No results for input(s): "AST", "ALT", "ALKPHOS", "BILITOT", "PROT", "ALBUMIN" in the last 168 hours. No results for input(s): "LIPASE", "AMYLASE" in the last 168 hours. No results for input(s): "AMMONIA" in the last 168 hours.  ABG    Component Value Date/Time   TCO2 24 10/08/2009 1019     Coagulation Profile: No results for input(s): "INR", "PROTIME" in the last 168 hours.  Cardiac Enzymes: No results for input(s): "CKTOTAL", "CKMB", "CKMBINDEX", "TROPONINI" in the last 168 hours.  HbA1C: Hgb A1c MFr Bld  Date/Time Value Ref Range Status  02/19/2011 05:00 AM 5.6 <5.7 % Final    Comment:    (NOTE)                                                                       According to the ADA Clinical Practice Recommendations for 2011, when HbA1c is used as a screening test:  >=6.5%   Diagnostic of Diabetes Mellitus           (if abnormal result is confirmed) 5.7-6.4%   Increased risk of developing Diabetes Mellitus References:Diagnosis and Classification of Diabetes  [place],Diabetes [id number] 1):S62-S69 and Standards of Medical Care in         Diabetes - 2011,Diabetes [id number] (Suppl 1):S11-S61.    CBG: No results for input(s): "GLUCAP" in the last 168 hours.  Review of Systems:   10 point review of system taken, please see HPI for positives and negatives. History of cough with sputum production blood-tinged at times.  Complains of chest pain at the site of coughing.  Denies fevers chills sweats.  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   Surgical History:   Past Surgical History:  Procedure Laterality Date   MULTIPLE TOOTH EXTRACTIONS     ORIF ANKLE FRACTURE  05/03/2012   Procedure: OPEN REDUCTION INTERNAL FIXATION (ORIF) ANKLE FRACTURE;  Surgeon: Wylene Simmer, MD;  Location: [place];  Service: Orthopedics;  Laterality: Left;     Social History:  reports that she has been smoking cigarettes. She has a 30.00 pack-year smoking history. She does not have any smokeless tobacco history on file. She reports current alcohol use. She reports that she does not currently use drugs after having used the following drugs: Cocaine.   Family History:  Her family history includes Diabetes in her mother; Heart disease in her sister and sister; Hypertension in her mother.   Allergies No Known Allergies   Home Medications  Prior to Admission medications   Medication Sig Start Date End Date Taking? Authorizing Provider  acetaminophen (TYLENOL) 500 MG tablet Take 1,000 mg by mouth every 4 (four) hours as needed. For pain   Yes [provider]  guaifenesin (ROBITUSSIN) 100 MG/5ML syrup Take 200 mg by mouth 3 (three) times daily as needed for cough.   Yes [provider]  pseudoephedrine (SUDAFED) 30 MG tablet Take 30 mg by mouth every 4 (four) hours as needed for congestion.   Yes [provider]  rivaroxaban (XARELTO) 10 MG TABS tablet Take 1  tablet (10 mg total) by mouth daily. 05/03/12 06/13/19  Wylene Simmer, MD     Critical care time: Ferol Luz Derica Leiber ACNP Acute Care Nurse Practitioner [place] Please consult Amion 09/02/2022, 9:12 AM

## 2022-09-02 NOTE — Interval H&P Note (Signed)
History and Physical Interval Note:  09/02/2022 1:35 PM  [name]  has presented today for surgery, with the diagnosis of lung mass.  The various methods of treatment have been discussed with the patient and family. After consideration of risks, benefits and other options for treatment, the patient has consented to  Procedure(s): [place] (Bilateral) as a surgical intervention.  The patient's history has been reviewed, patient examined, no change in status, stable for surgery.  I have reviewed the patient's chart and labs.  Questions were answered to the patient's satisfaction.     [name]

## 2022-09-02 NOTE — Progress Notes (Signed)
CCMD notified this nurse that pt HR is 180s non-sustaining, notified MD, she order EKG stat. EKG done and in the chart.

## 2022-09-02 NOTE — Progress Notes (Incomplete)
     Daily Progress Note Intern Pager: [phone]  Patient name: [name] Medical record number: [id number] Date of birth: 1956-01-21 Age: 67 y.o. Gender: female  Primary Care Provider: Pcp, No Consultants: PCCM Code Status: Full code  Pt Overview and Major Events to Date:  3/21: Admitted 3/22: S/p bronchoscopy  Assessment and Plan:  [name] is a 67 y.o. female presenting with shortness of breath and hemoptysis. PMHx includes tobacco use.   * Lung mass Evaluated by PCCM.  S/p bronchoscopy.  Follow-up with pulmonology outpatient scheduled. -PCCM s/o -PT/OT following -Wean oxygen as tolerated  -Atrovent nebs Q6H, avoiding albuterol for tachycardia -Tessalon and Codeine PRN for cough  -Lidocaine patch for rib discomfort when coughing   Sepsis (HCC) Bp stable. Continues to be tachycardic, asymptomatic. Afebrile. -CTX and Azithromycin for CAP coverage - F/u blood cultures   Smoking 52 pack year history. -Nicotine patch 21mg   Alcohol withdrawal (HCC) CIWAs 0. Denies symptoms of withdrawal. -CIWA q6h   FEN/GI: Regular diet PPx: Lovenox Dispo:Home today. Barriers include none.   Subjective:  ***  Objective: Temp:  [96.8 F (36 C)-100.3 F (37.9 C)] 98.4 F (36.9 C) (03/22 2132) Pulse Rate:  [102-112] 104 (03/22 2132) Resp:  [16-29] 18 (03/22 2132) BP: (106-136)/(62-89) 114/68 (03/22 2132) SpO2:  [90 %-100 %] 94 % (03/22 2132) Physical Exam: General: *** Cardiovascular: *** Respiratory: *** Abdomen: *** Extremities: ***  Laboratory: Most recent CBC Lab Results  Component Value Date   WBC 13.2 (H) 09/02/2022   HGB 9.1 (L) 09/02/2022   HCT 27.3 (L) 09/02/2022   MCV 92.9 09/02/2022   PLT 282 09/02/2022   Most recent BMP    Latest Ref Rng & Units 09/02/2022    3:50 AM  BMP  Glucose 70 - 99 mg/dL 102   BUN 8 - 23 mg/dL 22   Creatinine 0.44 - 1.00 mg/dL 0.88   Sodium 135 - 145 mmol/L 134   Potassium 3.5 - 5.1 mmol/L 3.7   Chloride 98 -  111 mmol/L 105   CO2 22 - 32 mmol/L 20   Calcium 8.9 - 10.3 mg/dL 8.7    Darci Current, DO 09/02/2022, 9:36 PM  PGY-1, [place] Intern pager: [phone], text pages welcome Secure chat group [place]

## 2022-09-02 NOTE — Progress Notes (Addendum)
Daily Progress Note Intern Pager: [phone]  Patient name: [name] Medical record number: [id number] Date of birth: [birth year] Age: 67 y.o. Gender: female  Primary Care Provider: Pcp, No Consultants: PCCM Code Status: Full code  Pt Overview and Major Events to Date:  3/21: Admitted to [facility]  Assessment and Plan: [name] is a 67 y.o. female presenting with shortness of breath and hemoptysis. PMHx includes tobacco use.  * Lung mass PCCM assess patient this AM and recommended bronchoscopy today for further evaluation. NPO pending procedure. -PCCM following, to see tomorrow -PT/OT following -Wean oxygen as tolerated  -Atrovent nebs Q6H, avoiding albuterol for tachycardia -Tessalon and Codeine PRN for cough  -Lidocaine patch for rib discomfort when coughing   Sepsis (HCC) Bp stable. Continues to be tachycardic, asymptomatic. Afebrile. -CTX and Azithromycin for CAP coverage - F/u blood cultures   Smoking 52 pack year history. -Nicotine patch 21mg   Alcohol withdrawal (HCC) CIWAs 0. Denies symptoms of withdrawal. -CIWA q6h   FEN/GI: Regular PPx: Lovenox Dispo: Home pending further work up  Subjective:  Patient assessed at bedside, reports she is still coughing up blood and yellow tinged sputum. Stable on 2L and reports no increased WOB.  Objective: Temp:  [97.8 F (36.6 C)-100.3 F (37.9 C)] 99.5 F (37.5 C) (03/22 1143) Pulse Rate:  [76-122] 107 (03/22 1143) Resp:  [16-37] 19 (03/22 1143) BP: (97-134)/(65-108) 125/69 (03/22 1143) SpO2:  [91 %-98 %] 97 % (03/22 1143) Physical Exam: General: Sitting up in bed with arms across chest, NAD Cardiovascular: RRR without murmur Respiratory: Coarse rhonchi through out, normal WOB on 2L [place] Abdomen: Soft, non-tender Extremities: No peripheral edema  Laboratory: Most recent CBC Lab Results  Component Value Date   WBC 13.2 (H) 09/02/2022   HGB 9.1 (L) 09/02/2022   HCT 27.3 (L) 09/02/2022   MCV 92.9  09/02/2022   PLT 282 09/02/2022   Most recent BMP    Latest Ref Rng & Units 09/02/2022    3:50 AM  BMP  Glucose 70 - 99 mg/dL 102   BUN 8 - 23 mg/dL 22   Creatinine 0.44 - 1.00 mg/dL 0.88   Sodium 135 - 145 mmol/L 134   Potassium 3.5 - 5.1 mmol/L 3.7   Chloride 98 - 111 mmol/L 105   CO2 22 - 32 mmol/L 20   Calcium 8.9 - 10.3 mg/dL 8.7     Other pertinent labs: HIV ab: reactive Lactic acid: 1.2 BNP: 97.3  Imaging/Diagnostic Tests: CT Chest Wo Contrast Result Date: 09/01/2022 IMPRESSION: 1. Findings suspicious for bronchogenic neoplasm in the RIGHT chest associated with multifocal pulmonary involvement and bulky mediastinal adenopathy including LEFT paratracheal adenopathy. 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 2. Loculated pleural fluid likely explained by presence of neoplasm with malignant effusion. Would also correlate with any signs of infection. 3. Irregular pericardial thickening is suggested as well. 4. Top-normal bilateral axillary nodal tissue most of which retains fatty hila. These are nonspecific. Given extensive disease in the chest the patient may benefit from PET imaging for further staging when the patient is able. 5. Small pericardial effusion, question pericardial thickening on noncontrast imaging. 6. Subtle indistinct areas of nodularity in the LEFT lower lobe measuring 6 x 5 mm. Could indicate developing multifocal neoplasm also involving the LEFT chest. 7. Low-density hepatic lesion not well evaluated. Further evaluation with dedicated abdominal imaging or PET when the patient is able is suggested. 8. Lytic lesion at T2 could reflect early bony metastatic  disease. 9. Three-vessel coronary artery disease and aortic atherosclerosis. Aortic Atherosclerosis (ICD10-I70.0).   DG Chest 2 View Result Date: 09/01/2022 IMPRESSION: Mass-like opacity in the right mid lung field measuring approximately 6.5 x 4.5 cm. Enlargement of the right hilum and right  paratracheal stripe suggesting underlying adenopathy. Findings are most concerning for malignancy. Chest CT with IV contrast is recommended for further evaluation. These results will be called to the ordering clinician or representative by the Radiologist Assistant, and communication documented in the PACS or [employer].  Colletta [place], MD 09/02/2022, 1:20 PM  PGY-1, [place] Intern pager: [phone], text pages welcome Secure chat group [place]

## 2022-09-03 ENCOUNTER — Telehealth: Payer: Self-pay | Admitting: Pulmonary Disease

## 2022-09-03 DIAGNOSIS — Z21 Asymptomatic human immunodeficiency virus [HIV] infection status: Secondary | ICD-10-CM

## 2022-09-03 DIAGNOSIS — J9611 Chronic respiratory failure with hypoxia: Secondary | ICD-10-CM | POA: Insufficient documentation

## 2022-09-03 DIAGNOSIS — R918 Other nonspecific abnormal finding of lung field: Secondary | ICD-10-CM | POA: Diagnosis not present

## 2022-09-03 DIAGNOSIS — B20 Human immunodeficiency virus [HIV] disease: Secondary | ICD-10-CM | POA: Insufficient documentation

## 2022-09-03 DIAGNOSIS — C3490 Malignant neoplasm of unspecified part of unspecified bronchus or lung: Secondary | ICD-10-CM

## 2022-09-03 LAB — CBC WITH DIFFERENTIAL/PLATELET
Abs Immature Granulocytes: 0.16 10*3/uL — ABNORMAL HIGH (ref 0.00–0.07)
Basophils Absolute: 0 10*3/uL (ref 0.0–0.1)
Basophils Relative: 0 %
Eosinophils Absolute: 0 10*3/uL (ref 0.0–0.5)
Eosinophils Relative: 0 %
HCT: 28.1 % — ABNORMAL LOW (ref 36.0–46.0)
Hemoglobin: 9 g/dL — ABNORMAL LOW (ref 12.0–15.0)
Immature Granulocytes: 2 %
Lymphocytes Relative: 7 %
Lymphs Abs: 0.5 10*3/uL — ABNORMAL LOW (ref 0.7–4.0)
MCH: 30.5 pg (ref 26.0–34.0)
MCHC: 32 g/dL (ref 30.0–36.0)
MCV: 95.3 fL (ref 80.0–100.0)
Monocytes Absolute: 0.4 10*3/uL (ref 0.1–1.0)
Monocytes Relative: 5 %
Neutro Abs: 7.1 10*3/uL (ref 1.7–7.7)
Neutrophils Relative %: 86 %
Platelets: 320 10*3/uL (ref 150–400)
RBC: 2.95 MIL/uL — ABNORMAL LOW (ref 3.87–5.11)
RDW: 13.4 % (ref 11.5–15.5)
WBC: 8.2 10*3/uL (ref 4.0–10.5)
nRBC: 0 % (ref 0.0–0.2)

## 2022-09-03 LAB — COMPREHENSIVE METABOLIC PANEL
ALT: 23 U/L (ref 0–44)
AST: 34 U/L (ref 15–41)
Albumin: 1.6 g/dL — ABNORMAL LOW (ref 3.5–5.0)
Alkaline Phosphatase: 53 U/L (ref 38–126)
Anion gap: 5 (ref 5–15)
BUN: 20 mg/dL (ref 8–23)
CO2: 20 mmol/L — ABNORMAL LOW (ref 22–32)
Calcium: 8.7 mg/dL — ABNORMAL LOW (ref 8.9–10.3)
Chloride: 108 mmol/L (ref 98–111)
Creatinine, Ser: 0.66 mg/dL (ref 0.44–1.00)
GFR, Estimated: >60 mL/min (ref >60)
Glucose, Bld: 134 mg/dL — ABNORMAL HIGH (ref 70–99)
Potassium: 4.5 mmol/L (ref 3.5–5.1)
Sodium: 133 mmol/L — ABNORMAL LOW (ref 135–145)
Total Bilirubin: 0.4 mg/dL (ref 0.3–1.2)
Total Protein: 8.2 g/dL — ABNORMAL HIGH (ref 6.5–8.1)

## 2022-09-03 LAB — CULTURE, BLOOD (ROUTINE X 2): Special Requests: ADEQUATE

## 2022-09-03 LAB — HIV-1 RNA QUANT-NO REFLEX-BLD
HIV 1 RNA Quant: 5350 copies/mL
LOG10 HIV-1 RNA: 3.728 log10copy/mL

## 2022-09-03 MED ORDER — LIP MEDEX EX OINT: TOPICAL_OINTMENT | CUTANEOUS | Status: DC | PRN

## 2022-09-03 MED ORDER — AMOXICILLIN-POT CLAVULANATE 875-125 MG PO TABS: 1.0000 | ORAL_TABLET | Freq: Two times a day (BID) | ORAL | Status: DC

## 2022-09-03 MED ORDER — SODIUM CHLORIDE 0.9 % IV SOLN: 1.0000 g | Freq: Once | INTRAVENOUS | Status: DC

## 2022-09-03 MED ORDER — VITAMIN B-1 100 MG PO TABS: 100.0000 mg | ORAL_TABLET | Freq: Every day | ORAL | 0 refills | Status: DC

## 2022-09-03 MED ORDER — FOLIC ACID 1 MG PO TABS: 1.0000 mg | ORAL_TABLET | Freq: Every day | ORAL | 0 refills | Status: DC

## 2022-09-03 MED ORDER — SODIUM CHLORIDE 0.9 % IV SOLN
500.0000 mg | Freq: Once | INTRAVENOUS | Status: DC
  Filled 2022-09-03: qty 5

## 2022-09-03 MED ORDER — AMOXICILLIN-POT CLAVULANATE 875-125 MG PO TABS: 1.0000 | ORAL_TABLET | Freq: Two times a day (BID) | ORAL | 0 refills | Status: AC

## 2022-09-03 NOTE — Progress Notes (Signed)
PCCM:  Met with patient this morning.  Overall doing fine post bronchoscopy she will need referral to medical oncology after discharge.  I have already reached out to Dr. Earlie Server and Beatriz Stallion to set up outpatient follow-up.  Patient will need outpatient MRI as well as nuclear medicine pet imaging to complete staging.  I will ensure that she has follow-up in our clinic next week.  [facility] 09/03/2022 10:08 AM

## 2022-09-03 NOTE — Progress Notes (Addendum)
Received referral for home O2. Pt doesn't have a preference for a [facility] agency for DME referral. Contacted Jasmine with [facility] for DME referral. Pt agreed with [facility].

## 2022-09-03 NOTE — Discharge Instructions (Addendum)
Dear [name],  Thank you for letting us participate in your care. 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  You were treated with nebulizers, oxygen, and antibiotics.  You will have an outpatient appointment with pulmonology to discuss the results of your lung biopsy.  It is important you attend this appointment.   POST-HOSPITAL & CARE INSTRUCTIONS START the antibiotic called Augmentin. Take as prescribed until it has been completely finished.  Do NOT skip doses Do NOT stop early Follow up with pulmonology to discuss the results of your lung biopsy Establish with a primary care doctor for routine care  DOCTOR'S APPOINTMENT   No future appointments.  Follow-up Information     Curt Bears, MD. Schedule an appointment as soon as possible for a visit.   Specialty: Oncology Why: The [facility] onc[facility] clinic will likely reach out within a week to schedule follow up with them. Please call directly if you do not hear from them. Contact information: [place] [address] [phone]         Garner Nash, DO. Schedule an appointment as soon as possible for a visit.   Specialty: Pulmonary Disease Why: You will need to follow up in the pulmonology (lung) clinic. They will call you for an appointment. It will likely be Wednesday 3/27 or thursday 3/28. Please call them directly if they do not call your Monday for an appointment. Contact information: [address] [place] [address] [phone]                 Take care and be well!  [facility]  [place], [place] [address] [phone]

## 2022-09-03 NOTE — Progress Notes (Signed)
   09/03/22 0900  Mobility  Activity Ambulated independently in hallway  Level of Assistance Independent  Assistive Device None  Distance Ambulated (ft) 30 ft  Activity Response Tolerated poorly  Mobility Referral Yes  $Mobility charge 1 Mobility   Mobility Specialist Progress Note  Pt in bed and agreeable. X2 setaed breaks d/t c/o feeling dizzy. Returned EOB w/ RN in room.   [place] Mobility Specialist  Please contact via SecureChat or Rehab office at [phone]

## 2022-09-03 NOTE — Progress Notes (Signed)
Nurse requested Mobility Specialist to perform oxygen saturation test with pt which includes removing pt from oxygen both at rest and while ambulating.  Below are the results from that testing.     Patient Saturations on Room Air at Rest = spO2 92%  Patient Saturations on Room Air while Ambulating = sp02 87% .   Patient Saturations on 1 Liters of oxygen while Ambulating = sp02 91%  At end of testing pt left in room on 1  Liters of oxygen.  Reported results to nurse.

## 2022-09-03 NOTE — Progress Notes (Signed)
FMTS Brief Progress Note  S: Notified by nursing of elevated HR. Went by to assess patient. Patient sitting comfortably. Denied any chest pain, palpitations or any symptoms.   O: BP (!) 140/95 (BP Location: Right Arm)   Pulse (!) 111   Temp 97.9 F (36.6 C) (Oral)   Resp 20   Ht 5\' 9"  (1.753 m)   Wt 207 lb 14.3 oz (94.3 kg)   SpO2 93%   BMI 30.70 kg/m   GEN: alert, NAD  CARDIO: regular rate and rhythm. No m/r/g.  A/P: Asymptomatic sinus tachycardia  -Patient appears to have had asymptomatic sinus tachycardia after walking with nurse for ambulatory pulse ox. EKG ordered to rule out Afib and EKG confirmed sinus tachycardia. Patient stable for discharge.   Rolanda Lundborg, MD 09/03/2022, 5:15 PM PGY-1, [facility] Resident  Please page [phone] with questions.

## 2022-09-03 NOTE — Telephone Encounter (Signed)
PCCM:  Patient had inpatient bronchoscopy on Friday.  Please ensure follow-up in clinic on Wednesday or Thursday of this week.  Can see APP.  She will need a brain MRI with and without contrast as well as nuclear medicine pet imaging for staging of malignancy.  I have placed orders for these and with our PCC's will help schedule.  [facility] 09/03/2022 10:10 AM

## 2022-09-03 NOTE — Assessment & Plan Note (Signed)
HIV positive, awaiting PCR results. CD4 count <400

## 2022-09-03 NOTE — Progress Notes (Signed)
Patient ambulated second time in hallways and tolerated well.  Patient saturations while at rest is 93% on room air  Patient saturation while ambulating is 95-96% on room air

## 2022-09-03 NOTE — Progress Notes (Signed)
   09/03/22 1550  Assess: MEWS Score  BP (!) 140/95  MAP (mmHg) 107  Pulse Rate (!) 111  Resp 20  Level of Consciousness Alert  SpO2 93 %  O2 Device Room Air  Assess: MEWS Score  MEWS Temp 0  MEWS Systolic 0  MEWS Pulse 2  MEWS RR 0  MEWS LOC 0  MEWS Score 2  MEWS Score Color Yellow  Assess: if the MEWS score is Yellow or Red  Were vital signs taken at a resting state? Yes  Focused Assessment No change from prior assessment  Does the patient meet 2 or more of the SIRS criteria? Yes  Does the patient have a confirmed or suspected source of infection? Yes  Provider and Rapid Response Notified? No  MEWS guidelines implemented  No, previously yellow, continue vital signs every 4 hours  Notify: Charge Nurse/RN  Name of Charge Nurse/RN Notified Catrina, RN  Provider Notification  Provider Name/Title Ezequiel Essex, MD  Date Provider Notified 09/03/22  Time Provider Notified 1550  Method of Notification Page  Notification Reason Other (Comment) (Elevated heart rate)  Provider response See new orders  Assess: SIRS CRITERIA  SIRS Temperature  0  SIRS Pulse 1  SIRS Respirations  0  SIRS WBC 0  SIRS Score Sum  1

## 2022-09-03 NOTE — TOC Initial Note (Signed)
Transition of Care [facility]) - Initial/Assessment Note    Patient Details  Name: [name] MRN: [id number] Date of Birth: 09-Oct-1955  Transition of [facility]) CM/SW Contact:    Elliot Gurney [place], [place] Phone Number: 09/03/2022, 3:40 PM  Clinical Narrative:                 Patient to discharge home today. Patient contacted by phone to discuss need for substance abuse resources due to alcohol use Patient declined need, however was agreeable to adding resources to her discharge packet for future use.  Expected Discharge Plan: Home/Self Care Barriers to Discharge: No Barriers Identified   Patient Goals and CMS Choice Patient states their goals for this hospitalization and ongoing recovery are:: to return home          Expected Discharge Plan and Services In-house Referral: Clinical Social Work       Expected Discharge Date: 09/03/22                                    Prior Living Arrangements/Services   Lives with:: Self   Do you feel safe going back to the place where you live?: Yes      Need for Family Participation in Patient Care: No (Comment) Care giver support system in place?: Yes (comment)   Criminal Activity/Legal Involvement Pertinent to Current Situation/Hospitalization: No - Comment as needed  Activities of Daily Living Home Assistive Devices/Equipment: None ADL Screening (condition at time of admission) Patient's cognitive ability adequate to safely complete daily activities?: Yes Is the patient deaf or have difficulty hearing?: No Does the patient have difficulty seeing, even when wearing glasses/contacts?: No Does the patient have difficulty concentrating, remembering, or making decisions?: No Patient able to express need for assistance with ADLs?: No Does the patient have difficulty dressing or bathing?: No Independently performs ADLs?: Yes (appropriate for developmental age) Does the patient have difficulty walking or climbing stairs?:  Yes Weakness of Legs: Both Weakness of Arms/Hands: None  Permission Sought/Granted                  Emotional Assessment       Orientation: : Oriented to Self, Oriented to Place, Oriented to  Time, Oriented to Situation Alcohol / Substance Use: Alcohol Use Psych Involvement: No (comment)  Admission diagnosis:  Lung mass [R91.8] Pneumonia of right lung due to infectious organism, unspecified part of lung [J18.9] Sepsis ([place]) [A41.9] Patient Active Problem List   Diagnosis Date Noted   HIV (human immunodeficiency virus infection) ([place]) 09/03/2022   Chronic respiratory failure with hypoxia ([place]) 09/03/2022   Lung mass 09/01/2022   Sepsis ([place]) 09/01/2022   Alcohol withdrawal ([place]) 09/01/2022   Preop cardiovascular exam 04/26/2012   Chest pain 04/26/2012   Tachycardia 04/26/2012   Smoking 04/26/2012   PCP:  Pcp, No Pharmacy:   [facility] ([place] [place] - [address] [place]) [place] [address] Phone: [phone] Fax: [id number] - [place], [place] - [address] [place] [address] Phone: [phone] Fax: [phone]     Social Determinants of Health (SDOH) Social History: SDOH Screenings   Food Insecurity: No Food Insecurity (09/02/2022)  Housing: Low Risk  (09/02/2022)  Transportation Needs: No Transportation Needs (09/02/2022)  Utilities: Not At Risk (09/02/2022)  Tobacco Use: High Risk (09/02/2022)   SDOH  Interventions:     Readmission Risk Interventions     No data to display

## 2022-09-04 ENCOUNTER — Encounter (HOSPITAL_COMMUNITY): Payer: Self-pay | Admitting: Pulmonary Disease

## 2022-09-04 LAB — CULTURE, BLOOD (ROUTINE X 2)

## 2022-09-05 ENCOUNTER — Telehealth: Payer: Self-pay | Admitting: Infectious Disease

## 2022-09-05 ENCOUNTER — Telehealth: Payer: Self-pay | Admitting: Internal Medicine

## 2022-09-05 LAB — HIV-1/2 AB - DIFFERENTIATION
HIV 1 Ab: REACTIVE — AB
HIV 2 Ab: NONREACTIVE

## 2022-09-05 LAB — CULTURE, BLOOD (ROUTINE X 2): Special Requests: ADEQUATE

## 2022-09-05 NOTE — Progress Notes (Unsigned)
[place] [phone]   [phone]  CONSULT NOTE  REFERRING PHYSICIAN: Dr. Valeta Harms  REASON FOR CONSULTATION:  Advanced bronchogenic carcinoma  HPI [name] is a 67 y.o. female with no significant past medical history except for newly diagnosed HIV is referred to the clinic for findings suggestive of advanced lung cancer.  The patient presented to an urgent care on 09/01/22 for cough with blood tinged sputum, congestion, fever, and pleuritic chest pain.  Her cough and congestion have been occurring for approximately 10 days.  She had an abnormal chest x-ray concerning for mass and she was subsequently sent to the emergency room.  CT in the emergency room revealed suspicious findings for bronchogenic carcinoma.  Specifically, the CT scan showed right chest mass with multifocal pulmonary involvement bulky mediastinal adenopathy including left paratracheal adenopathy and presumed lymphangitic tumor spread.  There is also loculated pleural effusion which could be malignant.  There was also irregular pericardial thickening and small pericardial effusion, indistinct nodularity in the left lower lobe, low-density hepatic lesion, not well-characterized, and a lytic lesion at T2 that could reflect early metastatic bony disease.  Dr. Valeta Harms arrange for bronchoscopy and biopsy which was performed on 09/02/2022.  The final pathology ([id number] ) revealed small cell lung cancer.  She is scheduled for her staging brain MRI and PET scan on 09/15/2022.  Since being discharged in the hospital, states that she is feeling "okay".  She reports her cough is not as "prominent" as it was and she denies any more blood-tinged mucus.  She does not exert herself so she denies any dyspnea on exertion.  Denies any fever or chills.  Denies any night sweats.  She reports her weight is exactly the same as it was last year.  She reports that she has been taking, and that she has been  having some diarrhea.  Denies any nausea or vomiting.  She reports she has been having some frontal headaches occasionally which started when she was in the hospital last week.   She expresses some frustration with picking up some prescriptions at the pharmacy which she was told were not covered by insurance.  She was able to pick up her Augmentin.  She also reports that [facility] came to her house to set up her oxygen tank.  However, she has some questions and concerns about whether the oxygen tank is functioning and is going to call them for clarification.  She did not have a PCP but is going to establish care with the [facility] residency clinic.  She is also expecting a call from infectious disease for her newly diagnosed HIV.   She reports that cancer runs in her family on the maternal and paternal side, although she is not able to tell me what type.   The patient is retired and used to work in [employer].  She is widowed.  She does not have any children.  She had been smoking for approximately 52 years averaging 2 packs of cigarettes per day.  She reports she has not smoked since she was discharged in the hospital on 09/02/2022.  She estimates she drinks 2 alcoholic beverages per week.  Denies any history of drug use.  The patient does not drive and she has some concerns about transportation.  She would also be open to seeing a member the nutritionist team.    HPI  Past Medical History:  Diagnosis Date   History of cardiovascular stress test  done in preparation of surgery- 05/01/2012   MI, old 9/12   signed out ama-has never gone back to have worked up    Past Surgical History:  Procedure Laterality Date   BRONCHIAL NEEDLE ASPIRATION BIOPSY  09/02/2022   Procedure: BRONCHIAL NEEDLE ASPIRATION BIOPSIES;  Surgeon: Garner Nash, DO;  Location: [place];  Service: Cardiopulmonary;;   MULTIPLE TOOTH EXTRACTIONS     ORIF ANKLE FRACTURE  05/03/2012   Procedure: OPEN REDUCTION  INTERNAL FIXATION (ORIF) ANKLE FRACTURE;  Surgeon: Wylene Simmer, MD;  Location: [place];  Service: Orthopedics;  Laterality: Left;   VIDEO BRONCHOSCOPY WITH ENDOBRONCHIAL ULTRASOUND Bilateral 09/02/2022   Procedure: VIDEO BRONCHOSCOPY WITH ENDOBRONCHIAL ULTRASOUND;  Surgeon: Garner Nash, DO;  Location: [place];  Service: Cardiopulmonary;  Laterality: Bilateral;    Family History  Problem Relation Age of Onset   Hypertension Mother    Diabetes Mother    Heart disease Sister    Heart disease Sister     Social History Social History   Tobacco Use   Smoking status: Every Day    Packs/day: 1.00    Years: 30.00    Additional pack years: 0.00    Total pack years: 30.00    Types: Cigarettes  Vaping Use   Vaping Use: Never used  Substance Use Topics   Alcohol use: Yes    Comment: 3x's / month    Drug use: Not Currently    Types: Cocaine    Comment: 04/29/2012    No Known Allergies  Current Outpatient Medications  Medication Sig Dispense Refill   acetaminophen (TYLENOL) 500 MG tablet Take 1,000 mg by mouth every 4 (four) hours as needed. For pain     amoxicillin-clavulanate (AUGMENTIN) 875-125 MG tablet Take 1 tablet by mouth every 12 (twelve) hours for 10 days. 20 tablet 0   folic acid (FOLVITE) 1 MG tablet Take 1 tablet (1 mg total) by mouth daily. 90 tablet 0   guaifenesin (ROBITUSSIN) 100 MG/5ML syrup Take 200 mg by mouth 3 (three) times daily as needed for cough.     thiamine (VITAMIN B-1) 100 MG tablet Take 1 tablet (100 mg total) by mouth daily. 90 tablet 0   No current facility-administered medications for this visit.    REVIEW OF SYSTEMS:   Review of Systems  Constitutional: Positive for decreased appetite. Negative for chills, fatigue, fever and unexpected weight change.  HENT: Negative for mouth sores, nosebleeds, sore throat and trouble swallowing.   Eyes: Negative for eye problems and icterus.  Respiratory: Positive for improving cough. Negative for  hemoptysis, shortness of breath and wheezing.   Cardiovascular: Negative for chest pain and leg swelling.  Gastrointestinal: Negative for abdominal pain, constipation, diarrhea, nausea and vomiting.  Genitourinary: Negative for bladder incontinence, difficulty urinating, dysuria, frequency and hematuria.   Musculoskeletal: Negative for back pain, gait problem, neck pain and neck stiffness.  Skin: Negative for itching and rash.  Neurological: Negative for dizziness, extremity weakness, gait problem, headaches, light-headedness and seizures.  Hematological: Negative for adenopathy. Does not bruise/bleed easily.  Psychiatric/Behavioral: Negative for confusion, depression and sleep disturbance. The patient is not nervous/anxious.     PHYSICAL EXAMINATION:  There were no vitals taken for this visit.  ECOG PERFORMANCE STATUS: 1   Physical Exam  Constitutional: Oriented to person, place, and time and thin appearing female and in no distress.   HENT:  Head: Normocephalic and atraumatic.  Mouth/Throat: Oropharynx is clear and moist. No oropharyngeal exudate.  Eyes: Conjunctivae are normal.  Right eye exhibits no discharge. Left eye exhibits no discharge. No scleral icterus.  Neck: Normal range of motion. Neck supple.  Cardiovascular: Normal rate, regular rhythm, normal heart sounds and intact distal pulses.   Pulmonary/Chest: Effort normal and breath sounds normal. No respiratory distress. No wheezes. No rales.  Abdominal: Soft. Bowel sounds are normal. Exhibits no distension and no mass. There is no tenderness.  Musculoskeletal: Normal range of motion. Exhibits no edema.  Lymphadenopathy:    No cervical adenopathy.  Neurological: Alert and oriented to person, place, and time. Exhibits normal muscle tone. Gait normal. Coordination normal.  Skin: Skin is warm and dry. No rash noted. Not diaphoretic. No erythema. No pallor.  Psychiatric: Mood, memory and judgment normal.  Vitals  reviewed.  LABORATORY DATA: Lab Results  Component Value Date   WBC 8.2 09/03/2022   HGB 9.0 (L) 09/03/2022   HCT 28.1 (L) 09/03/2022   MCV 95.3 09/03/2022   PLT 320 09/03/2022      Chemistry      Component Value Date/Time   NA 133 (L) 09/03/2022 0208   K 4.5 09/03/2022 0208   CL 108 09/03/2022 0208   CO2 20 (L) 09/03/2022 0208   BUN 20 09/03/2022 0208   CREATININE 0.66 09/03/2022 0208      Component Value Date/Time   CALCIUM 8.7 (L) 09/03/2022 0208   ALKPHOS 53 09/03/2022 0208   AST 34 09/03/2022 0208   ALT 23 09/03/2022 0208   BILITOT 0.4 09/03/2022 0208       RADIOGRAPHIC STUDIES: CT Chest Wo Contrast  Result Date: 09/01/2022 CLINICAL DATA:  67 year old female presents with suspected pneumonia. * Tracking Code: BO * EXAM: CT CHEST WITHOUT CONTRAST TECHNIQUE: Multidetector CT imaging of the chest was performed following the standard protocol without IV contrast. RADIATION DOSE REDUCTION: This exam was performed according to the departmental dose-optimization program which includes automated exposure control, adjustment of the mA and/or kV according to patient size and/or use of iterative reconstruction technique. COMPARISON:  None available. FINDINGS: Cardiovascular: Small pericardial effusion, question pericardial thickening on noncontrast imaging. Heart size is normal. Three-vessel coronary artery disease. Calcified aortic atherosclerosis without aneurysmal dilation. Central pulmonary vasculature is normal caliber though obscured by RIGHT hilar mass. Limited assessment of vascular structures and cardiac structures due to lack of intravenous contrast. Mediastinum/Nodes: Bulky RIGHT paratracheal nodal mass measuring 3.6 x 4.6 cm (image 55/3) This is nearly confluent with additional nodal tissue that measures up to 2.8 cm on image 46/3. Image 71/3 subcarinal adenopathy measuring 1.7 cm. Bulky RIGHT hilar adenopathy measuring up to 2.2 cm greatest thickness. This is confluent with  airspace disease in the RIGHT chest. Scattered bilateral axillary lymph nodes mainly less than a cm, those above a cm show fatty hila. Lungs/Pleura: 35/4, 1.6 x 1.5 cm RIGHT upper lobe nodule. Image 81/4 4.6 x 4.8 cm RIGHT middle lobe airspace disease with infiltrative appearance and surrounding diffuse septal thickening. Distortion and nodularity of the minor fissure and loculated appearing area in the major fissure on image 85/4 showing lower density and measuring 4.8 x 3.8 cm. Other loculated areas of pleural fluid in the RIGHT chest are also noted with small to moderate volume overall with loculated sub pulmonic component as the largest area of discrete pleural fluid in the RIGHT chest. Subtle indistinct areas of nodularity in the LEFT lower lobe 6 x 5 mm (image 74/4) airways are patent. Upper Abdomen: Low-density lesion in the LEFT hepatic lobe measures 9 mm. Area shows low-density, perhaps water  density though image noise makes assessment difficult. Adrenal glands are incompletely assessed, no gross mass. No signs of upper abdominal adenopathy. Musculoskeletal: No acute musculoskeletal process. Spinal degenerative changes. Osteopenia. Lytic area in the T2 vertebral body measuring 4-5 mm. Otherwise no destructive bone findings are noted IMPRESSION: 1. Findings suspicious for bronchogenic neoplasm in the RIGHT chest associated with multifocal pulmonary involvement and bulky mediastinal adenopathy including LEFT paratracheal adenopathy. 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 2. Loculated pleural fluid likely explained by presence of neoplasm with malignant effusion. Would also correlate with any signs of infection. 3. Irregular pericardial thickening is suggested as well. 4. Top-normal bilateral axillary nodal tissue most of which retains fatty hila. These are nonspecific. Given extensive disease in the chest the patient may benefit from PET imaging for further staging when the patient is  able. 5. Small pericardial effusion, question pericardial thickening on noncontrast imaging. 6. Subtle indistinct areas of nodularity in the LEFT lower lobe measuring 6 x 5 mm. Could indicate developing multifocal neoplasm also involving the LEFT chest. 7. Low-density hepatic lesion not well evaluated. Further evaluation with dedicated abdominal imaging or PET when the patient is able is suggested. 8. Lytic lesion at T2 could reflect early bony metastatic disease. 9. Three-vessel coronary artery disease and aortic atherosclerosis. Aortic Atherosclerosis (ICD10-I70.0). Electronically Signed   By: Zetta Bills M.D.   On: 09/01/2022 13:58   DG Chest 2 View  Result Date: 09/01/2022 CLINICAL DATA:  Chronic cough.  History of smoking EXAM: CHEST - 2 VIEW COMPARISON:  02/18/2011 FINDINGS: Heart size within normal limits. Mass-like opacity in the right mid lung field measuring approximately 6.5 x 4.5 cm. Enlargement of the right hilum and right paratracheal stripe suggesting of underlying adenopathy. Streaky interstitial opacities throughout the right mid to lower lung field. Small to moderate right-sided pleural effusion. Left lung is clear. IMPRESSION: Mass-like opacity in the right mid lung field measuring approximately 6.5 x 4.5 cm. Enlargement of the right hilum and right paratracheal stripe suggesting underlying adenopathy. Findings are most concerning for malignancy. Chest CT with IV contrast is recommended for further evaluation. These results will be called to the ordering clinician or representative by the Radiologist Assistant, and communication documented in the PACS or [employer]. Electronically Signed   By: Davina Poke D.O.   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 She presented with large right lower lobe lung masses in addition to bulky right hilar and mediastinal lymphadenopathy as well as  suspicious lytic bone lesion at L1. She was diagnosed in March 2024.   She is scheduled for her staging brain MRI and PET scan on 09/15/2022  Dr. Julien Nordmann had a lengthly discussion with the patient today about her current condition and treatment options.  Dr. Julien Nordmann discussed that her condition is treatable but not curable.  Standard of care chemotherapy includes carboplatin for an AUC of 5 on day 1, etoposide 100 mg/m on days 1, 2, and 3 and Imfinzi 5000 mg on day 1 IV every 3 weeks for 4 cycles followed by maintenance immunotherapy with Imfinzi.  The patient is interested in proceeding with systemic chemotherapy.  She is expected to start her first dose of this treatment on 09/13/22.  We discussed the adverse side effects of treatment including but not limited to alopecia, myelosuppression, nausea and vomiting, peripheral neuropathy, liver or renal dysfunction as well as immunotherapy mediated  adverse effects.   I will arrange for the patient to have a chemoeducation class prior to receiving her first cycle of chemotherapy.   I sent prescription for Compazine 10 mg every 6 hours as needed to the patient's pharmacy for nausea and vomiting.  I also sent her prescriptions for NicoDerm patches and Nicorette gum.  I placed a referral for social work to discuss options for transportation as well as with the nutritionist.  We will see her back for follow-up visit in 1 week to review her PET scan before starting cycle #1.  The patient is going to call [facility] for her questions regarding her oxygen tank.  I also encouraged her to discuss with them what is needed for a portable oxygen tank as she will likely need oxygen in the car coming to and from her appointments.  Patient understands the importance of following with her new PCP and with infectious disease.  She is aware to be on the look out for a call from their office and schedule appropriate follow-up.  Patient mention she has had some looser stools  since taking Augmentin.  Encouraged her to use probiotic and Imodium if needed  The patient voices understanding of current disease status and treatment options and is in agreement with the current care plan.  All questions were answered. The patient knows to call the clinic with any problems, questions or concerns. We can certainly see the patient much sooner if necessary.  Thank you so much for allowing me to participate in the care of [name]. I will continue to follow up the patient with you and assist in her care.  Disclaimer: This note was dictated with voice recognition software. Similar sounding words can inadvertently be transcribed and may not be corrected upon review.   Naz Denunzio L Catherine Cubero September 05, 2022, 3:41 PM   ADDENDUM: Hematology/Oncology Attending: I had a face-to-face encounter with the patient today.  I reviewed her records, lab, scan and recommended her care plan.  This is a very pleasant 67 years old African-American female with past medical history significant for myocardial infarction as well as recent diagnosis of HIV during her hospitalization.  The patient was seen at one of the urgent care center on September 01, 2022 complaining cough with blood-tinged sputum as well as chest congestion and pleuritic chest pain.  She had chest x-ray that showed concerning mass in the right lung.  She was sent to the emergency department and CT scan of the chest showed 1.6 x 1.5 cm right upper lobe nodule in addition to 4.6 x 4.8 cm right middle lobe airspace disease with infiltrative appearance and surrounding diffuse septal thickening and distortion and nodularity of the minor fissure.  There was also a mass lower than the previous 1 and measuring 4.8 x 3.8 cm.  There was other loculated areas of pleural fluid in the right chest and a nodule in the left lower lobe measuring 0.6 x 0.5 cm.  The scan also showed bulky right paratracheal nodal mass measuring 0.6 x 4.6 cm.  There  was nearly confluent with additional nodal tissue and measure up to 2.6 cm and subcarinal lymph node measuring 1.7 cm in addition to bulky right hilar adenopathy measuring up to 2.2 cm in greatest dimension in addition to scattered bilateral axillary lymph nodes less than 1 cm in size.  The patient was seen by Dr. Valeta Harms and she had video bronchoscopy with endobronchial ultrasound procedure and the final pathology ([id number]) malignant cells  consistent with a small cell undifferentiated carcinoma. Immunohistochemical stains are positive for CD56 and synaptophysin.  In addition the proliferation rate by Ki-67 is greater than 50%.  The overall findings are consistent with small cell carcinoma.  The patient was referred to [place] today for evaluation and recommendation regarding treatment of her condition. When seen today she continues to complain of fatigue and weakness as well as a cough. I had a lengthy discussion with the patient today about her current condition and treatment options. The patient had extensive stage (T3, N3, M1 C) small cell lung cancer presented with large right lower lobe lung masses in addition to bulky right hilar and mediastinal lymphadenopathy as well as suspicious lytic bone lesion at L1. I explained to the patient that she has incurable condition and all the treatment will be of palliative nature. I gave her the option of palliative care and hospice referral versus consideration of palliative systemic chemotherapy with immunotherapy with carboplatin for AUC of 4 on day 1, Imfinzi 1500 Mg IV on day 1 as well as etoposide initially at 80 Mg/M2 on days 1, 2 and 3 at least for the first cycle and then we may increase to 100 mg/M2 starting from cycle #2. The patient is interested in treatment and I discussed with her the adverse effect of this treatment including but not limited to alopecia, myelosuppression, nausea and vomiting, peripheral neuropathy, liver or renal dysfunction as well  as immunotherapy adverse effects. She will need to see infectious disease for management of her recently diagnosed HIV. She is expected to start the first cycle of this treatment next week. The patient will have a chemotherapy education class before the first dose of her treatment. She will come back for follow-up visit in 2 weeks for evaluation and discussion of the pending imaging studies including PET scan and MRI of the brain. The patient was advised to call immediately if she has any other concerning symptoms in the interval. The total time spent in the appointment was 90 minutes. Disclaimer: This note was dictated with voice recognition software. Similar sounding words can inadvertently be transcribed and may be missed upon review. Eilleen Kempf, MD

## 2022-09-05 NOTE — Telephone Encounter (Signed)
scheduled per 3/22 referral , pt has been called and confirmed date and time. Pt is aware of location and to arrive early for check in

## 2022-09-05 NOTE — Telephone Encounter (Signed)
Patient with newly diagnosed HIV. Primary team were aware of prelim + test. CD4 is above 300 and low Vl  I think she would be a great candidate HIV wise for VOLITION Study  Can we bring her in and offer her study?

## 2022-09-06 ENCOUNTER — Encounter: Payer: Self-pay | Admitting: Internal Medicine

## 2022-09-06 ENCOUNTER — Inpatient Hospital Stay: Payer: 59 | Attending: Physician Assistant | Admitting: Physician Assistant

## 2022-09-06 ENCOUNTER — Inpatient Hospital Stay: Payer: 59

## 2022-09-06 ENCOUNTER — Other Ambulatory Visit: Payer: Self-pay

## 2022-09-06 ENCOUNTER — Other Ambulatory Visit (HOSPITAL_COMMUNITY): Payer: Self-pay

## 2022-09-06 ENCOUNTER — Telehealth: Payer: Self-pay | Admitting: Family Medicine

## 2022-09-06 VITALS — BP 167/75 | HR 106 | Temp 97.5°F | Resp 18 | Ht 69.0 in | Wt 134.4 lb

## 2022-09-06 DIAGNOSIS — C3431 Malignant neoplasm of lower lobe, right bronchus or lung: Secondary | ICD-10-CM

## 2022-09-06 DIAGNOSIS — Z21 Asymptomatic human immunodeficiency virus [HIV] infection status: Secondary | ICD-10-CM

## 2022-09-06 DIAGNOSIS — Z7189 Other specified counseling: Secondary | ICD-10-CM | POA: Insufficient documentation

## 2022-09-06 DIAGNOSIS — C3490 Malignant neoplasm of unspecified part of unspecified bronchus or lung: Secondary | ICD-10-CM

## 2022-09-06 DIAGNOSIS — Z87891 Personal history of nicotine dependence: Secondary | ICD-10-CM

## 2022-09-06 DIAGNOSIS — B20 Human immunodeficiency virus [HIV] disease: Secondary | ICD-10-CM

## 2022-09-06 LAB — CULTURE, BLOOD (ROUTINE X 2): Culture: NO GROWTH

## 2022-09-06 LAB — CYTOLOGY - NON PAP

## 2022-09-06 MED ORDER — NICOTINE POLACRILEX 4 MG MT GUM
4.0000 mg | CHEWING_GUM | OROMUCOSAL | 2 refills | Status: DC | PRN
  Filled 2022-09-06: qty 50, 15d supply, fill #0

## 2022-09-06 MED ORDER — NICOTINE 7 MG/24HR TD PT24
7.0000 mg | MEDICATED_PATCH | Freq: Every day | TRANSDERMAL | 0 refills | Status: DC
  Filled 2022-09-06: qty 28, 28d supply, fill #0

## 2022-09-06 MED ORDER — PROCHLORPERAZINE MALEATE 10 MG PO TABS
10.0000 mg | ORAL_TABLET | Freq: Four times a day (QID) | ORAL | 2 refills | Status: DC | PRN
  Filled 2022-09-06: qty 30, 8d supply, fill #0

## 2022-09-06 NOTE — Anesthesia Postprocedure Evaluation (Signed)
Anesthesia Post Note  Patient: [name]  Procedure(s) Performed: VIDEO BRONCHOSCOPY WITH ENDOBRONCHIAL ULTRASOUND (Bilateral) BRONCHIAL NEEDLE ASPIRATION BIOPSIES     Patient location during evaluation: PACU Anesthesia Type: General Level of consciousness: awake and alert Pain management: pain level controlled Vital Signs Assessment: post-procedure vital signs reviewed and stable Respiratory status: spontaneous breathing, nonlabored ventilation, respiratory function stable and patient connected to nasal cannula oxygen Cardiovascular status: blood pressure returned to baseline, stable and tachycardic Postop Assessment: no apparent nausea or vomiting Anesthetic complications: no   No notable events documented.  Last Vitals:  Vitals:   09/03/22 0752 09/03/22 1550  BP: 106/68 (!) 140/95  Pulse: (!) 104 (!) 111  Resp: 18 20  Temp: 36.6 C   SpO2: 95% 93%                    Audry Pili

## 2022-09-06 NOTE — Telephone Encounter (Signed)
Called patient to discuss positive HIV screening in the hospital. Confirmed patient with DOB. Patient relayed she had not been told these results in the hospital. Discussed positive HIV tests and need for ID.   Patient inquires as to whether this is related to her cancer. She is frustrated, she says she has "only been with one person".   Discussed need for PCP, offered our clinic. She relayed previous attempts to establish with [facility] 6 months ago, difficulty due to [place] ride through insurance. Reports she would like to hire a lawyer to sue the insurance for inconsistent transportation, saying "I could've been on this 6 months ago".   Plan:  - will refer to ID - new patient appt at [facility] with myself 4/8 1:30 pm, will send letter- - awaiting [facility] lab  Ezequiel Essex, MD

## 2022-09-06 NOTE — Patient Instructions (Addendum)
-  There are two main categories of lung cancer, they are named based on the size of the cancer cell. One is called Non-Small cell lung cancer. The other type is Small Cell Lung Cancer -The sample (biopsy) that they took of your tumor was consistent with Small Cell Lung Cancer -We covered a lot of important information at your appointment today regarding what the treatment plan is moving forward. Here are the the main points that were discussed at your office visit with [place] today:  -The treatment that you will receive consists of Two chemotherapy drugs, called Carboplatin and Etoposide and One Immunotherapy drug called Imfinzi (Durvalumab)  -We are planning on starting your treatment next week on 09/13/22 but before your start your treatment, I would like you to attend a Chemotherapy Education Class. This involves having you sit down with one of our nurse educators. She will discuss with your one-on-one more details about your treatment as well as general information about resources here at the [facility] treatment will be given for three consecutive days every 3 weeks. We will check your labs once a week for the first ~4 treatments just to make sure that important components of your blood are in an acceptable range. Starting from the 5 treatment, it will get easier and you will be on immunotherapy alone (imfinzi) every 4 weeks (so it gets easier) -We will get a CT scan after 2 treatments to check on the progress of treatment  Medications:  -I have sent a few important medication prescriptions to your pharmacy.  -Compazine was sent to your pharmacy. This medication is for nausea. You may take this every 6 hours as needed if you feel nausous. .   Side Effects:  -The adverse effect of this treatment including but not limited to alopecia (losing your hair), myelosuppression (drops in the blood counts), nausea and vomiting, peripheral neuropathy (numbness and tingling in the hands and feet), liver or  renal dysfunction.   Referrals:  -I will place a referral to the social worker to help discuss how to enroll in the transportation program   Imaging:  -Please keep the appointment for the PET scan and MRI next week as scheduled   Follow up:  -We will see you back for a follow up visit in 1 week before the first treatment

## 2022-09-06 NOTE — Telephone Encounter (Signed)
Pt ahs appt to see Roxan Diesel, NP 09/12/22. Nothing further needed at this time.

## 2022-09-06 NOTE — Progress Notes (Signed)
START ON PATHWAY REGIMEN - Small Cell Lung     Cycles 1 through 4: A cycle is every 21 days:     Durvalumab      Carboplatin      Etoposide    Cycles 5 and beyond: A cycle is every 28 days:     Durvalumab   **Always confirm dose/schedule in your pharmacy ordering system**  Patient Characteristics: Newly Diagnosed, Preoperative or Nonsurgical Candidate (Clinical Staging), First Line, Extensive Stage Therapeutic Status: Newly Diagnosed, Preoperative or Nonsurgical Candidate (Clinical Staging) AJCC T Category: cT3 AJCC N Category: cN2 AJCC M Category: cM1c AJCC 8 Stage Grouping: IVB Stage Classification: Extensive  Intent of Therapy: Non-Curative / Palliative Intent, Discussed with Patient 

## 2022-09-07 ENCOUNTER — Other Ambulatory Visit (HOSPITAL_COMMUNITY): Payer: Self-pay

## 2022-09-07 ENCOUNTER — Telehealth: Payer: Self-pay | Admitting: Internal Medicine

## 2022-09-07 NOTE — Telephone Encounter (Signed)
Per 3/27 IB reached out to patient to schedule, left voicemail.

## 2022-09-08 ENCOUNTER — Telehealth: Payer: Self-pay

## 2022-09-08 ENCOUNTER — Other Ambulatory Visit (HOSPITAL_COMMUNITY): Payer: Self-pay

## 2022-09-08 ENCOUNTER — Telehealth: Payer: Self-pay | Admitting: Internal Medicine

## 2022-09-08 NOTE — Telephone Encounter (Signed)
Patient made aware of results by Dr. Jeani Hawking with Family Medicine, needs new patient appointment.   Beryle Flock, RN

## 2022-09-08 NOTE — Telephone Encounter (Signed)
Entered in error

## 2022-09-08 NOTE — Telephone Encounter (Signed)
Scheduled per 03/26 los, patient has been called and notified.  

## 2022-09-09 ENCOUNTER — Inpatient Hospital Stay: Payer: 59 | Admitting: Licensed Clinical Social Worker

## 2022-09-09 DIAGNOSIS — C3490 Malignant neoplasm of unspecified part of unspecified bronchus or lung: Secondary | ICD-10-CM

## 2022-09-09 NOTE — Progress Notes (Signed)
[facility]  Initial Assessment   MAYLING BARRIO is a 67 y.o. year old female contacted by phone. Clinical Social Work was referred by medical provider for assessment of psychosocial needs.   SDOH (Social Determinants of Health) assessments performed: Yes   SDOH Screenings   Food Insecurity: No Food Insecurity (09/02/2022)  Housing: Low Risk  (09/02/2022)  Transportation Needs: No Transportation Needs (09/02/2022)  Utilities: Not At Risk (09/02/2022)  Tobacco Use: High Risk (09/04/2022)     Distress Screen completed: No     No data to display            Family/Social Information:  Housing Arrangement: patient lives alone Family members/support persons in your life? Pt states she has a number of nieces who reside locally as well as friends who are able to provide assistance as needed.   Transportation concerns: pt's nieces will be taking turns providing transportation.  Pt has tried to use her Medicaid transportation benefits, but states she has missed a number of appointments in the past due to their inconsistency.  Pt informed of the cancer center's transportation should she not be able to find a way in for her appointment.  Employment: Retired .  Income source: Paediatric nurse concerns: Yes, current concerns Type of concern: Utilities and Rent/ mortgage Food access concerns: no Religious or spiritual practice: Not known Services Currently in place:  none  Coping/ Adjustment to diagnosis: Patient understands treatment plan and what happens next? yes Concerns about diagnosis and/or treatment: Overwhelmed by information, How will I care for myself, and Quality of life Patient reported stressors: Finances, Adjusting to my illness, and Physical issues Hopes and/or priorities: Pt's priority is to start treatment w/ the hope of positive results Patient enjoys  not addressed Current coping skills/ strengths: Capable of independent living ,  Motivation for treatment/growth , and Supportive family/friends     SUMMARY: Current SDOH Barriers:  Financial constraints related to fixed income  Clinical Social Work Clinical Goal(s):  Explore community resource options for unmet needs related to:  Financial Strain   Interventions: Discussed common feeling and emotions when being diagnosed with cancer, and the importance of support during treatment Informed patient of the support team roles and support services at [facility] Provided [place] contact information and encouraged patient to call with any questions or concerns Provided patient with information about the Ar[employer]ist and the application process for the [employer].     Follow Up Plan: CSW will see patient on 4/3 in infusion Patient verbalizes understanding of plan: Yes    Henriette Combs, LCSW   Patient is participating in a Managed Medicaid Plan:  Yes

## 2022-09-12 ENCOUNTER — Telehealth: Payer: Self-pay | Admitting: Pulmonary Disease

## 2022-09-12 ENCOUNTER — Inpatient Hospital Stay: Payer: 59 | Attending: Internal Medicine | Admitting: Licensed Clinical Social Worker

## 2022-09-12 ENCOUNTER — Ambulatory Visit: Admission: RE | Admit: 2022-09-12 | Payer: 59 | Source: Ambulatory Visit

## 2022-09-12 ENCOUNTER — Inpatient Hospital Stay: Payer: 59 | Admitting: Nurse Practitioner

## 2022-09-12 DIAGNOSIS — Z5112 Encounter for antineoplastic immunotherapy: Secondary | ICD-10-CM | POA: Insufficient documentation

## 2022-09-12 DIAGNOSIS — R197 Diarrhea, unspecified: Secondary | ICD-10-CM | POA: Insufficient documentation

## 2022-09-12 DIAGNOSIS — C3490 Malignant neoplasm of unspecified part of unspecified bronchus or lung: Secondary | ICD-10-CM

## 2022-09-12 DIAGNOSIS — Z5111 Encounter for antineoplastic chemotherapy: Secondary | ICD-10-CM | POA: Insufficient documentation

## 2022-09-12 DIAGNOSIS — I959 Hypotension, unspecified: Secondary | ICD-10-CM | POA: Insufficient documentation

## 2022-09-12 DIAGNOSIS — Z5189 Encounter for other specified aftercare: Secondary | ICD-10-CM | POA: Insufficient documentation

## 2022-09-12 DIAGNOSIS — F17210 Nicotine dependence, cigarettes, uncomplicated: Secondary | ICD-10-CM | POA: Insufficient documentation

## 2022-09-12 DIAGNOSIS — Z21 Asymptomatic human immunodeficiency virus [HIV] infection status: Secondary | ICD-10-CM | POA: Insufficient documentation

## 2022-09-12 DIAGNOSIS — C3431 Malignant neoplasm of lower lobe, right bronchus or lung: Secondary | ICD-10-CM | POA: Insufficient documentation

## 2022-09-12 DIAGNOSIS — Z79899 Other long term (current) drug therapy: Secondary | ICD-10-CM | POA: Insufficient documentation

## 2022-09-12 DIAGNOSIS — R42 Dizziness and giddiness: Secondary | ICD-10-CM | POA: Insufficient documentation

## 2022-09-12 NOTE — Telephone Encounter (Signed)
Spoke with pt who states she unable to complete PET scan today nor see Katie in [place] r/t transportation issues. Pt would like PET scan to be rescheduled in [place] if possible. PCCs please advise. PCCs could you also cancel PET scan?

## 2022-09-12 NOTE — Progress Notes (Signed)
Discussed at oncology office visit   Thanks,  [place], DO [facility] 09/12/2022 3:55 PM

## 2022-09-12 NOTE — Telephone Encounter (Signed)
PT is calling to resched her appt today w/[facility] as ordered by Dr. Valeta Harms, to Friday because it conflicts with her PET scan in [place]. She has no transportation and can not get to both today.     Also wonders if PET can be sched closer to [facility] because of the same issue.   Pls call to advise. I will leave appointment open to see if we can find a resolution.  Her # is [phone]  Sending [employer] because of time constraints. Thank you.

## 2022-09-12 NOTE — Progress Notes (Signed)
[facility] CSW Progress Note  Holiday representative  received a message from pt requesting assistance with transportation.  CSW sent an email to transportation department informing of the request.  Pt has Medicaid, but states she has not set up transportation benefits and would not be able to secure Medicaid transport for appointments at the cancer center this week.  CSW to continue to follow and provide support as appropriate throughout duration of treatment.        Henriette Combs, LCSW    Patient is participating in a Managed Medicaid Plan:  Yes

## 2022-09-12 NOTE — Telephone Encounter (Signed)
Changed PET and OV apmt and notified patient

## 2022-09-13 ENCOUNTER — Other Ambulatory Visit: Payer: Self-pay

## 2022-09-13 ENCOUNTER — Inpatient Hospital Stay: Payer: 59 | Admitting: Nutrition

## 2022-09-13 ENCOUNTER — Inpatient Hospital Stay: Payer: 59

## 2022-09-13 ENCOUNTER — Other Ambulatory Visit: Payer: 59

## 2022-09-13 ENCOUNTER — Encounter: Payer: 59 | Admitting: Nutrition

## 2022-09-13 MED FILL — Dexamethasone Sodium Phosphate Inj 100 MG/10ML: INTRAMUSCULAR | Qty: 1 | Status: AC

## 2022-09-13 MED FILL — Fosaprepitant Dimeglumine For IV Infusion 150 MG (Base Eq): INTRAVENOUS | Qty: 5 | Status: AC

## 2022-09-13 NOTE — Progress Notes (Signed)
67 year old female diagnosed with advanced bronchogenic cancer/small cell lung cancer.  She is followed by Dr. Julien Nordmann.  She will receive Imfinzi, carboplatin, etoposide.  Past medical history includes newly diagnosed HIV, tobacco, alcohol withdrawal, chronic respiratory failure.  Medications include Folvite, Compazine, thiamine.  Labs include sodium 133, glucose 134, albumin 1.6 on March 23.  Height: 5 feet 9 inches. Weight: 134 pounds 6.4 ounces March 26. Usual body weight: 135 pounds per patient. BMI: 19.85. ECOG: 1.  Met with patient to review nutrition information during cancer treatment.  Patient reports she has decreased appetite and some diarrhea.  She has 9 more days of antibiotics to take and hopes the diarrhea will improve.  She denies nutrition impact symptoms at this time.  Nutrition diagnosis: Food and nutrition related knowledge deficit related to lung cancer and associated treatments as evidenced by no prior need for nutrition related information.  Intervention: Educated on the importance of smaller more frequent meals and snacks with higher calorie, higher protein foods for weight maintenance. Suggested between meal snacks with protein. Provided samples of Ensure complete. Educated on strategies for improving diarrhea and provided nutrition fact sheet.  Gave patient samples of Banatrol. Encouraged adequate hydration. Provided nutrition facts sheets on high-calorie, high-protein foods and snacks. Questions were answered.  Teach back method used.  Contact information given.  Monitoring, evaluation, goals: Patient will tolerate adequate calories and protein to minimize weight loss.  Next visit: To be scheduled as needed with treatment.  **Disclaimer: This note was dictated with voice recognition software. Similar sounding words can inadvertently be transcribed and this note may contain transcription errors which may not have been corrected upon publication of note.**

## 2022-09-14 ENCOUNTER — Other Ambulatory Visit (HOSPITAL_COMMUNITY): Payer: Self-pay

## 2022-09-14 ENCOUNTER — Inpatient Hospital Stay: Payer: 59

## 2022-09-14 ENCOUNTER — Encounter: Payer: Self-pay | Admitting: Internal Medicine

## 2022-09-14 ENCOUNTER — Encounter: Payer: Self-pay | Admitting: Medical Oncology

## 2022-09-14 ENCOUNTER — Inpatient Hospital Stay (HOSPITAL_BASED_OUTPATIENT_CLINIC_OR_DEPARTMENT_OTHER): Payer: 59 | Admitting: Internal Medicine

## 2022-09-14 VITALS — BP 136/83 | HR 94 | Resp 16

## 2022-09-14 DIAGNOSIS — C3490 Malignant neoplasm of unspecified part of unspecified bronchus or lung: Secondary | ICD-10-CM

## 2022-09-14 DIAGNOSIS — Z5111 Encounter for antineoplastic chemotherapy: Secondary | ICD-10-CM | POA: Diagnosis present

## 2022-09-14 DIAGNOSIS — R197 Diarrhea, unspecified: Secondary | ICD-10-CM | POA: Diagnosis not present

## 2022-09-14 DIAGNOSIS — I959 Hypotension, unspecified: Secondary | ICD-10-CM | POA: Diagnosis not present

## 2022-09-14 DIAGNOSIS — F17210 Nicotine dependence, cigarettes, uncomplicated: Secondary | ICD-10-CM | POA: Diagnosis not present

## 2022-09-14 DIAGNOSIS — Z5112 Encounter for antineoplastic immunotherapy: Secondary | ICD-10-CM | POA: Diagnosis not present

## 2022-09-14 DIAGNOSIS — Z5189 Encounter for other specified aftercare: Secondary | ICD-10-CM | POA: Diagnosis not present

## 2022-09-14 DIAGNOSIS — Z21 Asymptomatic human immunodeficiency virus [HIV] infection status: Secondary | ICD-10-CM | POA: Diagnosis not present

## 2022-09-14 DIAGNOSIS — C3431 Malignant neoplasm of lower lobe, right bronchus or lung: Secondary | ICD-10-CM | POA: Diagnosis not present

## 2022-09-14 DIAGNOSIS — Z79899 Other long term (current) drug therapy: Secondary | ICD-10-CM | POA: Diagnosis not present

## 2022-09-14 DIAGNOSIS — R42 Dizziness and giddiness: Secondary | ICD-10-CM | POA: Diagnosis not present

## 2022-09-14 LAB — CBC WITH DIFFERENTIAL (CANCER CENTER ONLY)
Abs Immature Granulocytes: 0.02 10*3/uL (ref 0.00–0.07)
Basophils Absolute: 0 10*3/uL (ref 0.0–0.1)
Basophils Relative: 1 %
Eosinophils Absolute: 0.1 10*3/uL (ref 0.0–0.5)
Eosinophils Relative: 2 %
HCT: 29.4 % — ABNORMAL LOW (ref 36.0–46.0)
Hemoglobin: 9.4 g/dL — ABNORMAL LOW (ref 12.0–15.0)
Immature Granulocytes: 1 %
Lymphocytes Relative: 27 %
Lymphs Abs: 0.7 10*3/uL (ref 0.7–4.0)
MCH: 29.9 pg (ref 26.0–34.0)
MCHC: 32 g/dL (ref 30.0–36.0)
MCV: 93.6 fL (ref 80.0–100.0)
Monocytes Absolute: 0.2 10*3/uL (ref 0.1–1.0)
Monocytes Relative: 7 %
Neutro Abs: 1.6 10*3/uL — ABNORMAL LOW (ref 1.7–7.7)
Neutrophils Relative %: 62 %
Platelet Count: 413 10*3/uL — ABNORMAL HIGH (ref 150–400)
RBC: 3.14 MIL/uL — ABNORMAL LOW (ref 3.87–5.11)
RDW: 13.2 % (ref 11.5–15.5)
WBC Count: 2.6 10*3/uL — ABNORMAL LOW (ref 4.0–10.5)
nRBC: 0 % (ref 0.0–0.2)

## 2022-09-14 LAB — CMP (CANCER CENTER ONLY)
ALT: 11 U/L (ref 0–44)
AST: 14 U/L — ABNORMAL LOW (ref 15–41)
Albumin: 3 g/dL — ABNORMAL LOW (ref 3.5–5.0)
Alkaline Phosphatase: 51 U/L (ref 38–126)
Anion gap: 4 — ABNORMAL LOW (ref 5–15)
BUN: 10 mg/dL (ref 8–23)
CO2: 24 mmol/L (ref 22–32)
Calcium: 9.2 mg/dL (ref 8.9–10.3)
Chloride: 107 mmol/L (ref 98–111)
Creatinine: 0.63 mg/dL (ref 0.44–1.00)
GFR, Estimated: >60 mL/min (ref >60)
Glucose, Bld: 73 mg/dL (ref 70–99)
Potassium: 3.3 mmol/L — ABNORMAL LOW (ref 3.5–5.1)
Sodium: 135 mmol/L (ref 135–145)
Total Bilirubin: 0.2 mg/dL — ABNORMAL LOW (ref 0.3–1.2)
Total Protein: 9.2 g/dL — ABNORMAL HIGH (ref 6.5–8.1)

## 2022-09-14 LAB — TSH: TSH: 0.97 u[IU]/mL (ref 0.350–4.500)

## 2022-09-14 MED ORDER — SODIUM CHLORIDE 0.9 % IV SOLN
1500.0000 mg | Freq: Once | INTRAVENOUS | Status: AC
  Administered 2022-09-14: 1500 mg via INTRAVENOUS
  Filled 2022-09-14: qty 30

## 2022-09-14 MED ORDER — SODIUM CHLORIDE 0.9 % IV SOLN
150.0000 mg | Freq: Once | INTRAVENOUS | Status: AC
  Administered 2022-09-14: 150 mg via INTRAVENOUS
  Filled 2022-09-14: qty 150

## 2022-09-14 MED ORDER — PALONOSETRON HCL INJECTION 0.25 MG/5ML
0.2500 mg | Freq: Once | INTRAVENOUS | Status: AC
  Administered 2022-09-14: 0.25 mg via INTRAVENOUS
  Filled 2022-09-14: qty 5

## 2022-09-14 MED ORDER — SODIUM CHLORIDE 0.9 % IV SOLN
316.0000 mg | Freq: Once | INTRAVENOUS | Status: AC
  Administered 2022-09-14: 320 mg via INTRAVENOUS
  Filled 2022-09-14: qty 32

## 2022-09-14 MED ORDER — SODIUM CHLORIDE 0.9 % IV SOLN
10.0000 mg | Freq: Once | INTRAVENOUS | Status: AC
  Administered 2022-09-14: 10 mg via INTRAVENOUS
  Filled 2022-09-14: qty 10

## 2022-09-14 MED ORDER — SODIUM CHLORIDE 0.9 % IV SOLN
80.0000 mg/m2 | Freq: Once | INTRAVENOUS | Status: AC
  Administered 2022-09-14: 138 mg via INTRAVENOUS
  Filled 2022-09-14: qty 6.9

## 2022-09-14 MED ORDER — SODIUM CHLORIDE 0.9 % IV SOLN: Freq: Once | INTRAVENOUS | Status: AC

## 2022-09-14 MED ORDER — ACETAMINOPHEN 325 MG PO TABS
650.0000 mg | ORAL_TABLET | Freq: Once | ORAL | Status: AC
  Administered 2022-09-14: 650 mg via ORAL
  Filled 2022-09-14: qty 2

## 2022-09-14 NOTE — Patient Instructions (Signed)
[facility]  Discharge Instructions: Thank you for choosing [facility] to provide your oncology and hematology care.   If you have a lab appointment with the [facility], please go directly to the [facility] and check in at the registration area.   Wear comfortable clothing and clothing appropriate for easy access to any Portacath or PICC line.   We strive to give you quality time with your provider. You may need to reschedule your appointment if you arrive late (15 or more minutes).  Arriving late affects you and other patients whose appointments are after yours.  Also, if you miss three or more appointments without notifying the office, you may be dismissed from the clinic at the provider's discretion.      For prescription refill requests, have your pharmacy contact our office and allow 72 hours for refills to be completed.    Today you received the following chemotherapy and/or immunotherapy agents: Imfinzi, Carboplatin, Etoposide      To help prevent nausea and vomiting after your treatment, we encourage you to take your nausea medication as directed.  BELOW ARE SYMPTOMS THAT SHOULD BE REPORTED IMMEDIATELY: *FEVER GREATER THAN 100.4 F (38 C) OR HIGHER *CHILLS OR SWEATING *NAUSEA AND VOMITING THAT IS NOT CONTROLLED WITH YOUR NAUSEA MEDICATION *UNUSUAL SHORTNESS OF BREATH *UNUSUAL BRUISING OR BLEEDING *URINARY PROBLEMS (pain or burning when urinating, or frequent urination) *BOWEL PROBLEMS (unusual diarrhea, constipation, pain near the anus) TENDERNESS IN MOUTH AND THROAT WITH OR WITHOUT PRESENCE OF ULCERS (sore throat, sores in mouth, or a toothache) UNUSUAL RASH, SWELLING OR PAIN  UNUSUAL VAGINAL DISCHARGE OR ITCHING   Items with * indicate a potential emergency and should be followed up as soon as possible or go to the Emergency Department if any problems should occur.  Please show the CHEMOTHERAPY ALERT CARD or  IMMUNOTHERAPY ALERT CARD at check-in to the Emergency Department and triage nurse.  Should you have questions after your visit or need to cancel or reschedule your appointment, please contact [facility]  Dept: [phone]  and follow the prompts.  Office hours are 8:00 a.m. to 4:30 p.m. Monday - Friday. Please note that voicemails left after 4:00 p.m. may not be returned until the following business day.  We are closed weekends and major holidays. You have access to a nurse at all times for urgent questions. Please call the main number to the clinic Dept: [phone] and follow the prompts.   For any non-urgent questions, you may also contact your provider using MyChart. We now offer e-Visits for anyone 30 and older to request care online for non-urgent symptoms. For details visit [email or web address].   Also download the MyChart app! Go to the app store, search "MyChart", open the app, select [facility], and log in with your MyChart username and password. Durvalumab Injection What is this medication? DURVALUMAB (dur VAL ue mab) treats some types of cancer. It works by helping your immune system slow or stop the spread of cancer cells. It is a monoclonal antibody. This medicine may be used for other purposes; ask your health care provider or pharmacist if you have questions. COMMON BRAND NAME(S): IMFINZI What should I tell my care team before I take this medication? They need to know if you have any of these conditions: Allogeneic stem cell transplant (uses someone else's stem cells) Autoimmune diseases, such as Crohn disease, ulcerative colitis, lupus History of chest radiation Nervous  system problems, such as Guillain-Barre syndrome, myasthenia gravis Organ transplant An unusual or allergic reaction to durvalumab, other medications, foods, dyes, or preservatives Pregnant or trying to get pregnant Breast-feeding How should I use this  medication? This medication is infused into a vein. It is given by your care team in a hospital or clinic setting. A special MedGuide will be given to you before each treatment. Be sure to read this information carefully each time. Talk to your care team about the use of this medication in children. Special care may be needed. Overdosage: If you think you have taken too much of this medicine contact a poison control center or emergency room at once. NOTE: This medicine is only for you. Do not share this medicine with others. What if I miss a dose? Keep appointments for follow-up doses. It is important not to miss your dose. Call your care team if you are unable to keep an appointment. What may interact with this medication? Interactions have not been studied. This list may not describe all possible interactions. Give your health care provider a list of all the medicines, herbs, non-prescription drugs, or dietary supplements you use. Also tell them if you smoke, drink alcohol, or use illegal drugs. Some items may interact with your medicine. What should I watch for while using this medication? Your condition will be monitored carefully while you are receiving this medication. You may need blood work while taking this medication. This medication may cause serious skin reactions. They can happen weeks to months after starting the medication. Contact your care team right away if you notice fevers or flu-like symptoms with a rash. The rash may be red or purple and then turn into blisters or peeling of the skin. You may also notice a red rash with swelling of the face, lips, or lymph nodes in your neck or under your arms. Tell your care team right away if you have any change in your eyesight. Talk to your care team if you may be pregnant. Serious birth defects can occur if you take this medication during pregnancy and for 3 months after the last dose. You will need a negative pregnancy test before starting  this medication. Contraception is recommended while taking this medication and for 3 months after the last dose. Your care team can help you find the option that works for you. Do not breastfeed while taking this medication and for 3 months after the last dose. What side effects may I notice from receiving this medication? Side effects that you should report to your care team as soon as possible: Allergic reactions--skin rash, itching, hives, swelling of the face, lips, tongue, or throat Dry cough, shortness of breath or trouble breathing Eye pain, redness, irritation, or discharge with blurry or decreased vision Heart muscle inflammation--unusual weakness or fatigue, shortness of breath, chest pain, fast or irregular heartbeat, dizziness, swelling of the ankles, feet, or hands Hormone gland problems--headache, sensitivity to light, unusual weakness or fatigue, dizziness, fast or irregular heartbeat, increased sensitivity to cold or heat, excessive sweating, constipation, hair loss, increased thirst or amount of urine, tremors or shaking, irritability Infusion reactions--chest pain, shortness of breath or trouble breathing, feeling faint or lightheaded Kidney injury (glomerulonephritis)--decrease in the amount of urine, red or dark brown urine, foamy or bubbly urine, swelling of the ankles, hands, or feet Liver injury--right upper belly pain, loss of appetite, nausea, light-colored stool, dark yellow or brown urine, yellowing skin or eyes, unusual weakness or fatigue Pain, tingling, or  numbness in the hands or feet, muscle weakness, change in vision, confusion or trouble speaking, loss of balance or coordination, trouble walking, seizures Rash, fever, and swollen lymph nodes Redness, blistering, peeling, or loosening of the skin, including inside the mouth Sudden or severe stomach pain, bloody diarrhea, fever, nausea, vomiting Side effects that usually do not require medical attention (report these  to your care team if they continue or are bothersome): Bone, joint, or muscle pain Diarrhea Fatigue Loss of appetite Nausea Skin rash This list may not describe all possible side effects. Call your doctor for medical advice about side effects. You may report side effects to FDA at 1-800-FDA-1088. Where should I keep my medication? This medication is given in a hospital or clinic. It will not be stored at home. NOTE: This sheet is a summary. It may not cover all possible information. If you have questions about this medicine, talk to your doctor, pharmacist, or health care provider.  2023 Elsevier/Gold Standard (2021-10-01 00:00:00) Carboplatin Injection What is this medication? CARBOPLATIN (KAR boe pla tin) treats some types of cancer. It works by slowing down the growth of cancer cells. This medicine may be used for other purposes; ask your health care provider or pharmacist if you have questions. COMMON BRAND NAME(S): Paraplatin What should I tell my care team before I take this medication? They need to know if you have any of these conditions: Blood disorders Hearing problems Kidney disease Recent or ongoing radiation therapy An unusual or allergic reaction to carboplatin, cisplatin, other medications, foods, dyes, or preservatives Pregnant or trying to get pregnant Breast-feeding How should I use this medication? This medication is injected into a vein. It is given by your care team in a hospital or clinic setting. Talk to your care team about the use of this medication in children. Special care may be needed. Overdosage: If you think you have taken too much of this medicine contact a poison control center or emergency room at once. NOTE: This medicine is only for you. Do not share this medicine with others. What if I miss a dose? Keep appointments for follow-up doses. It is important not to miss your dose. Call your care team if you are unable to keep an appointment. What may  interact with this medication? Medications for seizures Some antibiotics, such as amikacin, gentamicin, neomycin, streptomycin, tobramycin Vaccines This list may not describe all possible interactions. Give your health care provider a list of all the medicines, herbs, non-prescription drugs, or dietary supplements you use. Also tell them if you smoke, drink alcohol, or use illegal drugs. Some items may interact with your medicine. What should I watch for while using this medication? Your condition will be monitored carefully while you are receiving this medication. You may need blood work while taking this medication. This medication may make you feel generally unwell. This is not uncommon, as chemotherapy can affect healthy cells as well as cancer cells. Report any side effects. Continue your course of treatment even though you feel ill unless your care team tells you to stop. In some cases, you may be given additional medications to help with side effects. Follow all directions for their use. This medication may increase your risk of getting an infection. Call your care team for advice if you get a fever, chills, sore throat, or other symptoms of a cold or flu. Do not treat yourself. Try to avoid being around people who are sick. Avoid taking medications that contain aspirin, acetaminophen, ibuprofen, naproxen,  or ketoprofen unless instructed by your care team. These medications may hide a fever. Be careful brushing or flossing your teeth or using a toothpick because you may get an infection or bleed more easily. If you have any dental work done, tell your dentist you are receiving this medication. Talk to your care team if you wish to become pregnant or think you might be pregnant. This medication can cause serious birth defects. Talk to your care team about effective forms of contraception. Do not breast-feed while taking this medication. What side effects may I notice from receiving this  medication? Side effects that you should report to your care team as soon as possible: Allergic reactions--skin rash, itching, hives, swelling of the face, lips, tongue, or throat Infection--fever, chills, cough, sore throat, wounds that don't heal, pain or trouble when passing urine, general feeling of discomfort or being unwell Low red blood cell level--unusual weakness or fatigue, dizziness, headache, trouble breathing Pain, tingling, or numbness in the hands or feet, muscle weakness, change in vision, confusion or trouble speaking, loss of balance or coordination, trouble walking, seizures Unusual bruising or bleeding Side effects that usually do not require medical attention (report to your care team if they continue or are bothersome): Hair loss Nausea Unusual weakness or fatigue Vomiting This list may not describe all possible side effects. Call your doctor for medical advice about side effects. You may report side effects to FDA at 1-800-FDA-1088. Where should I keep my medication? This medication is given in a hospital or clinic. It will not be stored at home. NOTE: This sheet is a summary. It may not cover all possible information. If you have questions about this medicine, talk to your doctor, pharmacist, or health care provider.  2023 Elsevier/Gold Standard (2007-07-21 00:00:00) Etoposide Injection What is this medication? ETOPOSIDE (e toe POE side) treats some types of cancer. It works by slowing down the growth of cancer cells. This medicine may be used for other purposes; ask your health care provider or pharmacist if you have questions. COMMON BRAND NAME(S): Etopophos, Toposar, VePesid What should I tell my care team before I take this medication? They need to know if you have any of these conditions: Infection Kidney disease Liver disease Low blood counts, such as low white cell, platelet, red cell counts An unusual or allergic reaction to etoposide, other medications,  foods, dyes, or preservatives If you or your partner are pregnant or trying to get pregnant Breastfeeding How should I use this medication? This medication is injected into a vein. It is given by your care team in a hospital or clinic setting. Talk to your care team about the use of this medication in children. Special care may be needed. Overdosage: If you think you have taken too much of this medicine contact a poison control center or emergency room at once. NOTE: This medicine is only for you. Do not share this medicine with others. What if I miss a dose? Keep appointments for follow-up doses. It is important not to miss your dose. Call your care team if you are unable to keep an appointment. What may interact with this medication? Warfarin This list may not describe all possible interactions. Give your health care provider a list of all the medicines, herbs, non-prescription drugs, or dietary supplements you use. Also tell them if you smoke, drink alcohol, or use illegal drugs. Some items may interact with your medicine. What should I watch for while using this medication? Your condition will be  monitored carefully while you are receiving this medication. This medication may make you feel generally unwell. This is not uncommon as chemotherapy can affect healthy cells as well as cancer cells. Report any side effects. Continue your course of treatment even though you feel ill unless your care team tells you to stop. This medication can cause serious side effects. To reduce the risk, your care team may give you other medications to take before receiving this one. Be sure to follow the directions from your care team. This medication may increase your risk of getting an infection. Call your care team for advice if you get a fever, chills, sore throat, or other symptoms of a cold or flu. Do not treat yourself. Try to avoid being around people who are sick. This medication may increase your risk to  bruise or bleed. Call your care team if you notice any unusual bleeding. Talk to your care team about your risk of cancer. You may be more at risk for certain types of cancers if you take this medication. Talk to your care team if you may be pregnant. Serious birth defects can occur if you take this medication during pregnancy and for 6 months after the last dose. You will need a negative pregnancy test before starting this medication. Contraception is recommended while taking this medication and for 6 months after the last dose. Your care team can help you find the option that works for you. If your partner can get pregnant, use a condom during sex while taking this medication and for 4 months after the last dose. Do not breastfeed while taking this medication. This medication may cause infertility. Talk to your care team if you are concerned about your fertility. What side effects may I notice from receiving this medication? Side effects that you should report to your care team as soon as possible: Allergic reactions--skin rash, itching, hives, swelling of the face, lips, tongue, or throat Infection--fever, chills, cough, sore throat, wounds that don't heal, pain or trouble when passing urine, general feeling of discomfort or being unwell Low red blood cell level--unusual weakness or fatigue, dizziness, headache, trouble breathing Unusual bruising or bleeding Side effects that usually do not require medical attention (report to your care team if they continue or are bothersome): Diarrhea Fatigue Hair loss Loss of appetite Nausea Vomiting This list may not describe all possible side effects. Call your doctor for medical advice about side effects. You may report side effects to FDA at 1-800-FDA-1088. Where should I keep my medication? This medication is given in a hospital or clinic. It will not be stored at home. NOTE: This sheet is a summary. It may not cover all possible information. If you  have questions about this medicine, talk to your doctor, pharmacist, or health care provider.  2023 Elsevier/Gold Standard (2007-07-21 00:00:00)

## 2022-09-14 NOTE — Progress Notes (Signed)
Per Dr Earlie Server, ok to proceed with WBC of 2.6 and K+ 3.3 today

## 2022-09-14 NOTE — Progress Notes (Signed)
Pt c/o "mild" headache.  Per Dr Julien Nordmann, ok for 650mg  po tylenol.

## 2022-09-14 NOTE — Progress Notes (Signed)
Patient seen by Dr. Inez Pilgrim are within treatment parameters.  Labs reviewed: and are within treatment parameters.  Per physician team,  Patient is ready for treatment and there are no changes to the treatment plan.Cycle 1 Day 1.

## 2022-09-14 NOTE — Patient Instructions (Signed)
Steps to Quit Smoking Smoking tobacco is the leading cause of preventable death. It can affect almost every organ in the body. Smoking puts you and people around you at risk for many serious, long-lasting (chronic) diseases. Quitting smoking can be hard, but it is one of the best things that you can do for your health. It is never too late to quit. Do not give up if you cannot quit the first time. Some people need to try many times to quit. Do your best to stick to your quit plan, and talk with your doctor if you have any questions or concerns. How do I get ready to quit? Pick a date to quit. Set a date within the next 2 weeks to give you time to prepare. Write down the reasons why you are quitting. Keep this list in places where you will see it often. Tell your family, friends, and co-workers that you are quitting. Their support is important. Talk with your doctor about the choices that may help you quit. Find out if your health insurance will pay for these treatments. Know the people, places, things, and activities that make you want to smoke (triggers). Avoid them. What first steps can I take to quit smoking? Throw away all cigarettes at home, at work, and in your car. Throw away the things that you use when you smoke, such as ashtrays and lighters. Clean your car. Empty the ashtray. Clean your home, including curtains and carpets. What can I do to help me quit smoking? Talk with your doctor about taking medicines and seeing a counselor. You are more likely to succeed when you do both. If you are pregnant or breastfeeding: Talk with your doctor about counseling or other ways to quit smoking. Do not take medicine to help you quit smoking unless your doctor tells you to. Quit right away Quit smoking completely, instead of slowly cutting back on how much you smoke over a period of time. Stopping smoking right away may be more successful than slowly quitting. Go to counseling. In-person is best  if this is an option. You are more likely to quit if you go to counseling sessions regularly. Take medicine You may take medicines to help you quit. Some medicines need a prescription, and some you can buy over-the-counter. Some medicines may contain a drug called nicotine to replace the nicotine in cigarettes. Medicines may: Help you stop having the desire to smoke (cravings). Help to stop the problems that come when you stop smoking (withdrawal symptoms). Your doctor may ask you to use: Nicotine patches, gum, or lozenges. Nicotine inhalers or sprays. Non-nicotine medicine that you take by mouth. Find resources Find resources and other ways to help you quit smoking and remain smoke-free after you quit. They include: Online chats with a counselor. Phone quitlines. Printed self-help materials. Support groups or group counseling. Text messaging programs. Mobile phone apps. Use apps on your mobile phone or tablet that can help you stick to your quit plan. Examples of free services include Quit Guide from the CDC and smokefree.gov  What can I do to make it easier to quit?  Talk to your family and friends. Ask them to support and encourage you. Call a phone quitline, such as 1-800-QUIT-NOW, reach out to support groups, or work with a counselor. Ask people who smoke to not smoke around you. Avoid places that make you want to smoke, such as: Bars. Parties. Smoke-break areas at work. Spend time with people who do not smoke. Lower   the stress in your life. Stress can make you want to smoke. Try these things to lower stress: Getting regular exercise. Doing deep-breathing exercises. Doing yoga. Meditating. What benefits will I see if I quit smoking? Over time, you may have: A better sense of smell and taste. Less coughing and sore throat. A slower heart rate. Lower blood pressure. Clearer skin. Better breathing. Fewer sick days. Summary Quitting smoking can be hard, but it is one of  the best things that you can do for your health. Do not give up if you cannot quit the first time. Some people need to try many times to quit. When you decide to quit smoking, make a plan to help you succeed. Quit smoking right away, not slowly over a period of time. When you start quitting, get help and support to keep you smoke-free. This information is not intended to replace advice given to you by your health care provider. Make sure you discuss any questions you have with your health care provider. Document Revised: 05/21/2021 Document Reviewed: 05/21/2021 Elsevier Patient Education  2023 [employer] Inc.  

## 2022-09-14 NOTE — Progress Notes (Signed)
[name] Telephone:([phone]) [phone]   [phone]  OFFICE PROGRESS NOTE  Ezequiel Essex, MD [address] [place]  [address]  DIAGNOSIS:   1) Extensive stage small cell lung cancer (T3, N3, M1 C) . She presented with large right lower lobe lung masses in addition to bulky right hilar and mediastinal lymphadenopathy as well as suspicious lytic bone lesion at L1. She was diagnosed in March 2024.  2) HIV diagnosed in March 2024.  PRIOR THERAPY: None  CURRENT THERAPY: Systemic chemotherapy with carboplatin for AUC of 5 on day 1 and etoposide 100 Mg/M2 on days 1, 2 and 3 with Imfinzi 1500 Mg on day 1 every 3 weeks with Neulasta support on day 5.  First dose September 14, 2022  INTERVAL HISTORY: [name] 67 y.o. female returns to the clinic today for follow-up visit.  The patient is feeling fine today with no concerning complaints except for intermittent headache.  She took ibuprofen last night for her headache.  She denied having any current chest pain, shortness of breath, cough or hemoptysis.  She has no nausea, vomiting, diarrhea or constipation.  She has no fever or chills.  She is here today for evaluation before starting the first dose of her chemotherapy.  MEDICAL HISTORY: Past Medical History:  Diagnosis Date   History of cardiovascular stress test    done in preparation of surgery- 05/01/2012   MI, old 9/12   signed out ama-has never gone back to have worked up    ALLERGIES:  has No Known Allergies.  MEDICATIONS:  Current Outpatient Medications  Medication Sig Dispense Refill   acetaminophen (TYLENOL) 500 MG tablet Take 1,000 mg by mouth every 4 (four) hours as needed. For pain     amoxicillin-clavulanate (AUGMENTIN) 875-125 MG tablet Take 1 tablet by mouth every 12 (twelve) hours for 10 days. 20 tablet 0   folic acid (FOLVITE) 1 MG tablet Take 1 tablet (1 mg total) by mouth daily. 90 tablet 0   guaifenesin (ROBITUSSIN) 100 MG/5ML syrup Take  200 mg by mouth 3 (three) times daily as needed for cough.     nicotine (NICODERM CQ) 7 mg/24hr patch Place 1 patch (7 mg total) onto the skin daily. 28 patch 0   nicotine polacrilex (NICORETTE) 4 MG gum Take 1 each (4 mg total) by mouth as needed for smoking cessation. 30 tablet 2   prochlorperazine (COMPAZINE) 10 MG tablet Take 1 tablet (10 mg total) by mouth every 6 (six) hours as needed. 30 tablet 2   thiamine (VITAMIN B-1) 100 MG tablet Take 1 tablet (100 mg total) by mouth daily. 90 tablet 0   No current facility-administered medications for this visit.    SURGICAL HISTORY:  Past Surgical History:  Procedure Laterality Date   BRONCHIAL NEEDLE ASPIRATION BIOPSY  09/02/2022   Procedure: BRONCHIAL NEEDLE ASPIRATION BIOPSIES;  Surgeon: Garner Nash, DO;  Location: [facility];  Service: Cardiopulmonary;;   MULTIPLE TOOTH EXTRACTIONS     ORIF ANKLE FRACTURE  05/03/2012   Procedure: OPEN REDUCTION INTERNAL FIXATION (ORIF) ANKLE FRACTURE;  Surgeon: Wylene Simmer, MD;  Location: [facility];  Service: Orthopedics;  Laterality: Left;   VIDEO BRONCHOSCOPY WITH ENDOBRONCHIAL ULTRASOUND Bilateral 09/02/2022   Procedure: VIDEO BRONCHOSCOPY WITH ENDOBRONCHIAL ULTRASOUND;  Surgeon: Garner Nash, DO;  Location: [facility];  Service: Cardiopulmonary;  Laterality: Bilateral;    REVIEW OF SYSTEMS:  A comprehensive review of systems was negative except for: Constitutional: positive for  fatigue Neurological: positive for headaches   PHYSICAL EXAMINATION: General appearance: alert, cooperative, fatigued, and no distress Head: Normocephalic, without obvious abnormality, atraumatic Neck: no adenopathy, no JVD, supple, symmetrical, trachea midline, and thyroid not enlarged, symmetric, no tenderness/mass/nodules Lymph nodes: Cervical, supraclavicular, and axillary nodes normal. Resp: clear to auscultation bilaterally Back: symmetric, no curvature. ROM normal. No CVA tenderness. Cardio: regular rate and  rhythm, S1, S2 normal, no murmur, click, rub or gallop GI: soft, non-tender; bowel sounds normal; no masses,  no organomegaly Extremities: extremities normal, atraumatic, no cyanosis or edema  ECOG PERFORMANCE STATUS: 1 - Symptomatic but completely ambulatory  Blood pressure 132/65, pulse 94, temperature 98.2 F (36.8 C), temperature source Oral, resp. rate 16, height 5\' 9"  (1.753 m), weight 131 lb 9.6 oz (59.7 kg), SpO2 98 %.  LABORATORY DATA: Lab Results  Component Value Date   WBC 8.2 09/03/2022   HGB 9.0 (L) 09/03/2022   HCT 28.1 (L) 09/03/2022   MCV 95.3 09/03/2022   PLT 320 09/03/2022      Chemistry      Component Value Date/Time   NA 133 (L) 09/03/2022 0208   K 4.5 09/03/2022 0208   CL 108 09/03/2022 0208   CO2 20 (L) 09/03/2022 0208   BUN 20 09/03/2022 0208   CREATININE 0.66 09/03/2022 0208      Component Value Date/Time   CALCIUM 8.7 (L) 09/03/2022 0208   ALKPHOS 53 09/03/2022 0208   AST 34 09/03/2022 0208   ALT 23 09/03/2022 0208   BILITOT 0.4 09/03/2022 0208       RADIOGRAPHIC STUDIES: CT Chest Wo Contrast  Result Date: 09/01/2022 CLINICAL DATA:  67 year old female presents with suspected pneumonia. * Tracking Code: BO * EXAM: CT CHEST WITHOUT CONTRAST TECHNIQUE: Multidetector CT imaging of the chest was performed following the standard protocol without IV contrast. RADIATION DOSE REDUCTION: This exam was performed according to the departmental dose-optimization program which includes automated exposure control, adjustment of the mA and/or kV according to patient size and/or use of iterative reconstruction technique. COMPARISON:  None available. FINDINGS: Cardiovascular: Small pericardial effusion, question pericardial thickening on noncontrast imaging. Heart size is normal. Three-vessel coronary artery disease. Calcified aortic atherosclerosis without aneurysmal dilation. Central pulmonary vasculature is normal caliber though obscured by RIGHT hilar mass. Limited  assessment of vascular structures and cardiac structures due to lack of intravenous contrast. Mediastinum/Nodes: Bulky RIGHT paratracheal nodal mass measuring 3.6 x 4.6 cm (image 55/3) This is nearly confluent with additional nodal tissue that measures up to 2.8 cm on image 46/3. Image 71/3 subcarinal adenopathy measuring 1.7 cm. Bulky RIGHT hilar adenopathy measuring up to 2.2 cm greatest thickness. This is confluent with airspace disease in the RIGHT chest. Scattered bilateral axillary lymph nodes mainly less than a cm, those above a cm show fatty hila. Lungs/Pleura: 35/4, 1.6 x 1.5 cm RIGHT upper lobe nodule. Image 81/4 4.6 x 4.8 cm RIGHT middle lobe airspace disease with infiltrative appearance and surrounding diffuse septal thickening. Distortion and nodularity of the minor fissure and loculated appearing area in the major fissure on image 85/4 showing lower density and measuring 4.8 x 3.8 cm. Other loculated areas of pleural fluid in the RIGHT chest are also noted with small to moderate volume overall with loculated sub pulmonic component as the largest area of discrete pleural fluid in the RIGHT chest. Subtle indistinct areas of nodularity in the LEFT lower lobe 6 x 5 mm (image 74/4) airways are patent. Upper Abdomen: Low-density lesion in the LEFT hepatic lobe  measures 9 mm. Area shows low-density, perhaps water density though image noise makes assessment difficult. Adrenal glands are incompletely assessed, no gross mass. No signs of upper abdominal adenopathy. Musculoskeletal: No acute musculoskeletal process. Spinal degenerative changes. Osteopenia. Lytic area in the T2 vertebral body measuring 4-5 mm. Otherwise no destructive bone findings are noted IMPRESSION: 1. Findings suspicious for bronchogenic neoplasm in the RIGHT chest associated with multifocal pulmonary involvement and bulky mediastinal adenopathy including LEFT paratracheal adenopathy. 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  2. Loculated pleural fluid likely explained by presence of neoplasm with malignant effusion. Would also correlate with any signs of infection. 3. Irregular pericardial thickening is suggested as well. 4. Top-normal bilateral axillary nodal tissue most of which retains fatty hila. These are nonspecific. Given extensive disease in the chest the patient may benefit from PET imaging for further staging when the patient is able. 5. Small pericardial effusion, question pericardial thickening on noncontrast imaging. 6. Subtle indistinct areas of nodularity in the LEFT lower lobe measuring 6 x 5 mm. Could indicate developing multifocal neoplasm also involving the LEFT chest. 7. Low-density hepatic lesion not well evaluated. Further evaluation with dedicated abdominal imaging or PET when the patient is able is suggested. 8. Lytic lesion at T2 could reflect early bony metastatic disease. 9. Three-vessel coronary artery disease and aortic atherosclerosis. Aortic Atherosclerosis (ICD10-I70.0). Electronically Signed   By: Zetta Bills M.D.   On: 09/01/2022 13:58   DG Chest 2 View  Result Date: 09/01/2022 CLINICAL DATA:  Chronic cough.  History of smoking EXAM: CHEST - 2 VIEW COMPARISON:  02/18/2011 FINDINGS: Heart size within normal limits. Mass-like opacity in the right mid lung field measuring approximately 6.5 x 4.5 cm. Enlargement of the right hilum and right paratracheal stripe suggesting of underlying adenopathy. Streaky interstitial opacities throughout the right mid to lower lung field. Small to moderate right-sided pleural effusion. Left lung is clear. IMPRESSION: Mass-like opacity in the right mid lung field measuring approximately 6.5 x 4.5 cm. Enlargement of the right hilum and right paratracheal stripe suggesting underlying adenopathy. Findings are most concerning for malignancy. Chest CT with IV contrast is recommended for further evaluation. These results will be called to the ordering clinician or  representative by the Radiologist Assistant, and communication documented in the PACS or [employer]. Electronically Signed   By: Davina Poke D.O.   On: 09/01/2022 12:13    ASSESSMENT AND PLAN: This is a very pleasant 67 years old African-American female with extensive stage small cell lung cancer (T3, N3, M1 C) . She presented with large right lower lobe lung masses in addition to bulky right hilar and mediastinal lymphadenopathy as well as suspicious lytic bone lesion at L1. She was diagnosed in March 2024.  She also has HIV diagnosed in March 2024.  She has not seen infectious disease yet. The patient is scheduled for MRI of the brain tomorrow and PET scan on September 22, 2022. She is currently on systemic chemotherapy with carboplatin for AUC of 4 on day 1 and etoposide 80 Mg/M2 on days 1, 2 and 3 with Neulasta support on day 5 as well as Imfinzi 1500 Mg IV on day 1 every 3 weeks.  With starting with the reduced dose of carboplatin and Doutova side because of her HIV status. I recommended for the patient to proceed with the first dose of her treatment today as planned. She was advised to keep her appointment for the PET scan as well as the MRI of the  brain For the HIV she is supposed to see infectious disease soon. She will come back for follow-up visit in 1 week for evaluation and management of any adverse effect of her treatment. The patient was advised to call immediately if she has any concerning symptoms in the interval. The patient voices understanding of current disease status and treatment options and is in agreement with the current care plan.  All questions were answered. The patient knows to call the clinic with any problems, questions or concerns. We can certainly see the patient much sooner if necessary.  The total time spent in the appointment was 30 minutes.  Disclaimer: This note was dictated with voice recognition software. Similar sounding words can inadvertently be  transcribed and may not be corrected upon review.

## 2022-09-15 ENCOUNTER — Other Ambulatory Visit: Payer: Self-pay | Admitting: Physician Assistant

## 2022-09-15 ENCOUNTER — Inpatient Hospital Stay: Payer: 59

## 2022-09-15 ENCOUNTER — Ambulatory Visit (HOSPITAL_COMMUNITY)
Admission: RE | Admit: 2022-09-15 | Discharge: 2022-09-15 | Disposition: A | Discharge status: Home / Self Care | Payer: 59 | Source: Ambulatory Visit | Attending: Pulmonary Disease | Admitting: Pulmonary Disease

## 2022-09-15 VITALS — BP 143/84 | HR 93 | Temp 97.8°F | Resp 18

## 2022-09-15 DIAGNOSIS — C3490 Malignant neoplasm of unspecified part of unspecified bronchus or lung: Secondary | ICD-10-CM

## 2022-09-15 DIAGNOSIS — Z5111 Encounter for antineoplastic chemotherapy: Secondary | ICD-10-CM | POA: Diagnosis not present

## 2022-09-15 LAB — T4: T4, Total: 5.4 ug/dL (ref 4.5–12.0)

## 2022-09-15 MED ORDER — SODIUM CHLORIDE 0.9 % IV SOLN
80.0000 mg/m2 | Freq: Once | INTRAVENOUS | Status: AC
  Administered 2022-09-15: 138 mg via INTRAVENOUS
  Filled 2022-09-15: qty 6.9

## 2022-09-15 MED ORDER — GADOBUTROL 1 MMOL/ML IV SOLN
6.0000 mL | Freq: Once | INTRAVENOUS | Status: AC | PRN
  Administered 2022-09-15: 6 mL via INTRAVENOUS

## 2022-09-15 MED ORDER — SODIUM CHLORIDE 0.9 % IV SOLN: Freq: Once | INTRAVENOUS | Status: AC

## 2022-09-15 MED ORDER — LIDOCAINE-PRILOCAINE 2.5-2.5 % EX CREA: 1.0000 | TOPICAL_CREAM | CUTANEOUS | 2 refills | Status: DC | PRN

## 2022-09-15 MED ORDER — SODIUM CHLORIDE 0.9 % IV SOLN
10.0000 mg | Freq: Once | INTRAVENOUS | Status: AC
  Administered 2022-09-15: 10 mg via INTRAVENOUS
  Filled 2022-09-15: qty 10

## 2022-09-15 MED ORDER — LIDOCAINE-PRILOCAINE 2.5-2.5 % EX CREA
1.0000 | TOPICAL_CREAM | CUTANEOUS | 2 refills | Status: DC | PRN
Start: 2022-09-15 — End: 2022-10-20

## 2022-09-15 MED FILL — Dexamethasone Sodium Phosphate Inj 100 MG/10ML: INTRAMUSCULAR | Qty: 1 | Status: AC

## 2022-09-15 NOTE — Patient Instructions (Signed)
[facility]  Discharge Instructions: Thank you for choosing [facility] to provide your oncology and hematology care.   If you have a lab appointment with the [facility], please go directly to the [facility] and check in at the registration area.   Wear comfortable clothing and clothing appropriate for easy access to any Portacath or PICC line.   We strive to give you quality time with your provider. You may need to reschedule your appointment if you arrive late (15 or more minutes).  Arriving late affects you and other patients whose appointments are after yours.  Also, if you miss three or more appointments without notifying the office, you may be dismissed from the clinic at the provider's discretion.      For prescription refill requests, have your pharmacy contact our office and allow 72 hours for refills to be completed.    Today you received the following chemotherapy and/or immunotherapy agents Etoposide    To help prevent nausea and vomiting after your treatment, we encourage you to take your nausea medication as directed.  BELOW ARE SYMPTOMS THAT SHOULD BE REPORTED IMMEDIATELY: *FEVER GREATER THAN 100.4 F (38 C) OR HIGHER *CHILLS OR SWEATING *NAUSEA AND VOMITING THAT IS NOT CONTROLLED WITH YOUR NAUSEA MEDICATION *UNUSUAL SHORTNESS OF BREATH *UNUSUAL BRUISING OR BLEEDING *URINARY PROBLEMS (pain or burning when urinating, or frequent urination) *BOWEL PROBLEMS (unusual diarrhea, constipation, pain near the anus) TENDERNESS IN MOUTH AND THROAT WITH OR WITHOUT PRESENCE OF ULCERS (sore throat, sores in mouth, or a toothache) UNUSUAL RASH, SWELLING OR PAIN  UNUSUAL VAGINAL DISCHARGE OR ITCHING   Items with * indicate a potential emergency and should be followed up as soon as possible or go to the Emergency Department if any problems should occur.  Please show the CHEMOTHERAPY ALERT CARD or IMMUNOTHERAPY ALERT CARD at check-in  to the Emergency Department and triage nurse.  Should you have questions after your visit or need to cancel or reschedule your appointment, please contact [facility]  Dept: [phone]  and follow the prompts.  Office hours are 8:00 a.m. to 4:30 p.m. Monday - Friday. Please note that voicemails left after 4:00 p.m. may not be returned until the following business day.  We are closed weekends and major holidays. You have access to a nurse at all times for urgent questions. Please call the main number to the clinic Dept: [phone] and follow the prompts.   For any non-urgent questions, you may also contact your provider using MyChart. We now offer e-Visits for anyone 19 and older to request care online for non-urgent symptoms. For details visit [email or web address].   Also download the MyChart app! Go to the app store, search "MyChart", open the app, select [facility], and log in with your MyChart username and password.  Etoposide Injection What is this medication? ETOPOSIDE (e toe POE side) treats some types of cancer. It works by slowing down the growth of cancer cells. This medicine may be used for other purposes; ask your health care provider or pharmacist if you have questions. COMMON BRAND NAME(S): Etopophos, Toposar, VePesid What should I tell my care team before I take this medication? They need to know if you have any of these conditions: Infection Kidney disease Liver disease Low blood counts, such as low white cell, platelet, red cell counts An unusual or allergic reaction to etoposide, other medications, foods, dyes, or preservatives If you or your  partner are pregnant or trying to get pregnant Breastfeeding How should I use this medication? This medication is injected into a vein. It is given by your care team in a hospital or clinic setting. Talk to your care team about the use of this medication in children. Special care may be  needed. Overdosage: If you think you have taken too much of this medicine contact a poison control center or emergency room at once. NOTE: This medicine is only for you. Do not share this medicine with others. What if I miss a dose? Keep appointments for follow-up doses. It is important not to miss your dose. Call your care team if you are unable to keep an appointment. What may interact with this medication? Warfarin This list may not describe all possible interactions. Give your health care provider a list of all the medicines, herbs, non-prescription drugs, or dietary supplements you use. Also tell them if you smoke, drink alcohol, or use illegal drugs. Some items may interact with your medicine. What should I watch for while using this medication? Your condition will be monitored carefully while you are receiving this medication. This medication may make you feel generally unwell. This is not uncommon as chemotherapy can affect healthy cells as well as cancer cells. Report any side effects. Continue your course of treatment even though you feel ill unless your care team tells you to stop. This medication can cause serious side effects. To reduce the risk, your care team may give you other medications to take before receiving this one. Be sure to follow the directions from your care team. This medication may increase your risk of getting an infection. Call your care team for advice if you get a fever, chills, sore throat, or other symptoms of a cold or flu. Do not treat yourself. Try to avoid being around people who are sick. This medication may increase your risk to bruise or bleed. Call your care team if you notice any unusual bleeding. Talk to your care team about your risk of cancer. You may be more at risk for certain types of cancers if you take this medication. Talk to your care team if you may be pregnant. Serious birth defects can occur if you take this medication during pregnancy and for  6 months after the last dose. You will need a negative pregnancy test before starting this medication. Contraception is recommended while taking this medication and for 6 months after the last dose. Your care team can help you find the option that works for you. If your partner can get pregnant, use a condom during sex while taking this medication and for 4 months after the last dose. Do not breastfeed while taking this medication. This medication may cause infertility. Talk to your care team if you are concerned about your fertility. What side effects may I notice from receiving this medication? Side effects that you should report to your care team as soon as possible: Allergic reactions--skin rash, itching, hives, swelling of the face, lips, tongue, or throat Infection--fever, chills, cough, sore throat, wounds that don't heal, pain or trouble when passing urine, general feeling of discomfort or being unwell Low red blood cell level--unusual weakness or fatigue, dizziness, headache, trouble breathing Unusual bruising or bleeding Side effects that usually do not require medical attention (report to your care team if they continue or are bothersome): Diarrhea Fatigue Hair loss Loss of appetite Nausea Vomiting This list may not describe all possible side effects. Call your doctor for  medical advice about side effects. You may report side effects to FDA at 1-800-FDA-1088. Where should I keep my medication? This medication is given in a hospital or clinic. It will not be stored at home. NOTE: This sheet is a summary. It may not cover all possible information. If you have questions about this medicine, talk to your doctor, pharmacist, or health care provider.  2023 Elsevier/Gold Standard (2007-07-21 00:00:00)

## 2022-09-15 NOTE — Progress Notes (Signed)
Contacted MRI. Pt has appt at 5:00. Will leave IV in place. Pt educated to proceed to ED after infusion to check in for MRI appt. She is agreeable with plan plan of care

## 2022-09-16 ENCOUNTER — Inpatient Hospital Stay: Payer: 59

## 2022-09-16 VITALS — BP 163/85 | HR 87 | Temp 97.8°F | Resp 17 | Wt 137.0 lb

## 2022-09-16 DIAGNOSIS — Z5111 Encounter for antineoplastic chemotherapy: Secondary | ICD-10-CM | POA: Diagnosis not present

## 2022-09-16 DIAGNOSIS — C3490 Malignant neoplasm of unspecified part of unspecified bronchus or lung: Secondary | ICD-10-CM

## 2022-09-16 MED ORDER — SODIUM CHLORIDE 0.9 % IV SOLN: Freq: Once | INTRAVENOUS | Status: AC

## 2022-09-16 MED ORDER — SODIUM CHLORIDE 0.9 % IV SOLN
10.0000 mg | Freq: Once | INTRAVENOUS | Status: AC
  Administered 2022-09-16: 10 mg via INTRAVENOUS
  Filled 2022-09-16: qty 10

## 2022-09-16 MED ORDER — SODIUM CHLORIDE 0.9 % IV SOLN
80.0000 mg/m2 | Freq: Once | INTRAVENOUS | Status: AC
  Administered 2022-09-16: 138 mg via INTRAVENOUS
  Filled 2022-09-16: qty 6.9

## 2022-09-16 NOTE — Patient Instructions (Addendum)
[facility]  Discharge Instructions: Thank you for choosing [facility] to provide your oncology and hematology care.   If you have a lab appointment with the Cancer Center, please go directly to the Cancer Center and check in at the registration area.   Wear comfortable clothing and clothing appropriate for easy access to any Portacath or PICC line.   We strive to give you quality time with your provider. You may need to reschedule your appointment if you arrive late (15 or more minutes).  Arriving late affects you and other patients whose appointments are after yours.  Also, if you miss three or more appointments without notifying the office, you may be dismissed from the clinic at the provider's discretion.      For prescription refill requests, have your pharmacy contact our office and allow 72 hours for refills to be completed.    Today you received the following chemotherapy and/or immunotherapy agents; Etoposide      To help prevent nausea and vomiting after your treatment, we encourage you to take your nausea medication as directed.  BELOW ARE SYMPTOMS THAT SHOULD BE REPORTED IMMEDIATELY: *FEVER GREATER THAN 100.4 F (38 C) OR HIGHER *CHILLS OR SWEATING *NAUSEA AND VOMITING THAT IS NOT CONTROLLED WITH YOUR NAUSEA MEDICATION *UNUSUAL SHORTNESS OF BREATH *UNUSUAL BRUISING OR BLEEDING *URINARY PROBLEMS (pain or burning when urinating, or frequent urination) *BOWEL PROBLEMS (unusual diarrhea, constipation, pain near the anus) TENDERNESS IN MOUTH AND THROAT WITH OR WITHOUT PRESENCE OF ULCERS (sore throat, sores in mouth, or a toothache) UNUSUAL RASH, SWELLING OR PAIN  UNUSUAL VAGINAL DISCHARGE OR ITCHING   Items with * indicate a potential emergency and should be followed up as soon as possible or go to the Emergency Department if any problems should occur.  Please show the CHEMOTHERAPY ALERT CARD or IMMUNOTHERAPY ALERT CARD at  check-in to the Emergency Department and triage nurse.  Should you have questions after your visit or need to cancel or reschedule your appointment, please contact [facility]  Dept: [phone]  and follow the prompts.  Office hours are 8:00 a.m. to 4:30 p.m. Monday - Friday. Please note that voicemails left after 4:00 p.m. may not be returned until the following business day.  We are closed weekends and major holidays. You have access to a nurse at all times for urgent questions. Please call the main number to the clinic Dept: [phone] and follow the prompts.   For any non-urgent questions, you may also contact your provider using MyChart. We now offer e-Visits for anyone 18 and older to request care online for non-urgent symptoms. For details visit [email or web address].   Also download the MyChart app! Go to the app store, search "MyChart", open the app, select , and log in with your MyChart username and password.   

## 2022-09-19 ENCOUNTER — Inpatient Hospital Stay: Payer: 59

## 2022-09-19 ENCOUNTER — Telehealth: Payer: Self-pay | Admitting: Pulmonary Disease

## 2022-09-19 ENCOUNTER — Other Ambulatory Visit: Payer: 59

## 2022-09-19 ENCOUNTER — Other Ambulatory Visit: Payer: Self-pay

## 2022-09-19 ENCOUNTER — Other Ambulatory Visit (HOSPITAL_COMMUNITY): Payer: Self-pay

## 2022-09-19 ENCOUNTER — Encounter: Payer: 59 | Admitting: Physician Assistant

## 2022-09-19 ENCOUNTER — Telehealth: Payer: Self-pay

## 2022-09-19 ENCOUNTER — Ambulatory Visit: Payer: 59

## 2022-09-19 ENCOUNTER — Other Ambulatory Visit: Payer: Self-pay | Admitting: Physician Assistant

## 2022-09-19 ENCOUNTER — Inpatient Hospital Stay (HOSPITAL_BASED_OUTPATIENT_CLINIC_OR_DEPARTMENT_OTHER): Payer: 59 | Admitting: Physician Assistant

## 2022-09-19 ENCOUNTER — Ambulatory Visit: Payer: Self-pay | Admitting: Family Medicine

## 2022-09-19 VITALS — BP 103/62 | HR 112 | Temp 98.3°F | Resp 20

## 2022-09-19 DIAGNOSIS — R197 Diarrhea, unspecified: Secondary | ICD-10-CM

## 2022-09-19 DIAGNOSIS — I639 Cerebral infarction, unspecified: Secondary | ICD-10-CM

## 2022-09-19 DIAGNOSIS — C3490 Malignant neoplasm of unspecified part of unspecified bronchus or lung: Secondary | ICD-10-CM

## 2022-09-19 DIAGNOSIS — Z5111 Encounter for antineoplastic chemotherapy: Secondary | ICD-10-CM | POA: Diagnosis not present

## 2022-09-19 LAB — CBC WITH DIFFERENTIAL (CANCER CENTER ONLY)
Abs Immature Granulocytes: 0.03 10*3/uL (ref 0.00–0.07)
Basophils Absolute: 0 10*3/uL (ref 0.0–0.1)
Basophils Relative: 0 %
Eosinophils Absolute: 0 10*3/uL (ref 0.0–0.5)
Eosinophils Relative: 1 %
HCT: 30.3 % — ABNORMAL LOW (ref 36.0–46.0)
Hemoglobin: 9.9 g/dL — ABNORMAL LOW (ref 12.0–15.0)
Immature Granulocytes: 1 %
Lymphocytes Relative: 23 %
Lymphs Abs: 0.7 10*3/uL (ref 0.7–4.0)
MCH: 30 pg (ref 26.0–34.0)
MCHC: 32.7 g/dL (ref 30.0–36.0)
MCV: 91.8 fL (ref 80.0–100.0)
Monocytes Absolute: 0.1 10*3/uL (ref 0.1–1.0)
Monocytes Relative: 2 %
Neutro Abs: 2.2 10*3/uL (ref 1.7–7.7)
Neutrophils Relative %: 73 %
Platelet Count: 342 10*3/uL (ref 150–400)
RBC: 3.3 MIL/uL — ABNORMAL LOW (ref 3.87–5.11)
RDW: 13 % (ref 11.5–15.5)
Smear Review: NORMAL
WBC Count: 3.1 10*3/uL — ABNORMAL LOW (ref 4.0–10.5)
nRBC: 0 % (ref 0.0–0.2)

## 2022-09-19 LAB — GASTROINTESTINAL PANEL BY PCR, STOOL (REPLACES STOOL CULTURE)

## 2022-09-19 LAB — CMP (CANCER CENTER ONLY)
ALT: 12 U/L (ref 0–44)
AST: 17 U/L (ref 15–41)
Albumin: 3.2 g/dL — ABNORMAL LOW (ref 3.5–5.0)
Alkaline Phosphatase: 46 U/L (ref 38–126)
Anion gap: 7 (ref 5–15)
BUN: 18 mg/dL (ref 8–23)
CO2: 23 mmol/L (ref 22–32)
Calcium: 9.5 mg/dL (ref 8.9–10.3)
Chloride: 103 mmol/L (ref 98–111)
Creatinine: 0.91 mg/dL (ref 0.44–1.00)
GFR, Estimated: >60 mL/min (ref >60)
Glucose, Bld: 101 mg/dL — ABNORMAL HIGH (ref 70–99)
Potassium: 3.7 mmol/L (ref 3.5–5.1)
Sodium: 133 mmol/L — ABNORMAL LOW (ref 135–145)
Total Bilirubin: 0.3 mg/dL (ref 0.3–1.2)
Total Protein: 8.6 g/dL — ABNORMAL HIGH (ref 6.5–8.1)

## 2022-09-19 LAB — C DIFFICILE QUICK SCREEN W PCR REFLEX
C Diff antigen: NEGATIVE
C Diff interpretation: NOT DETECTED
C Diff toxin: NEGATIVE

## 2022-09-19 MED ORDER — PEGFILGRASTIM-CBQV 6 MG/0.6ML ~~LOC~~ SOSY
6.0000 mg | PREFILLED_SYRINGE | Freq: Once | SUBCUTANEOUS | Status: AC
  Administered 2022-09-19: 6 mg via SUBCUTANEOUS
  Filled 2022-09-19: qty 0.6

## 2022-09-19 NOTE — Progress Notes (Addendum)
[facility] OFFICE PROGRESS NOTE  [name], MD [address] [place] [address]  DIAGNOSIS: 1) Extensive stage small cell lung cancer (T3, N3, M1 C) . She presented with large right lower lobe lung masses in addition to bulky right hilar and mediastinal lymphadenopathy as well as suspicious lytic bone lesion at L1. She was diagnosed in March 2024.  2) HIV diagnosed in March 2024.  PRIOR THERAPY: None  CURRENT THERAPY: Systemic chemotherapy with carboplatin for AUC of 4 on day 1 and etoposide 80 Mg/M2 on days 1, 2 and 3 with Imfinzi 1500 Mg on day 1 every 3 weeks with Neulasta support on day 5. First dose September 14, 2022 . She is on dose reduced chemotherapy due to her HIV.   INTERVAL HISTORY: [name] 67 y.o. female returns to the clinic today for a follow-up visit.  The patient was recently diagnosed with extensive stage small cell lung cancer.  She underwent her first cycle of chemotherapy last week and tolerated it fairly well except for fatigue.  Her main complaint today is related to some ongoing diarrhea which started even prior to chemotherapy around 09/05/2022.  She reports that she has diarrhea "all day long". She denies blood in the stool and denies any abdominal pain.  Denies any nausea or vomiting.  She has been taking Imodium as prescribed and she is also been trying to increase her rice intake. She is only getting mild improvement with the imodium. She also mention she had a mild nosebleed this morning that was self-limiting.  It started after she blew her nose.  She is not on any blood thinners.  She had a staging brain MRI last week and the results are still pending at this time.  She denies any fever, chills, or weight loss.  She states her weight is stable at 137 lbs.  She was a little bit hypotensive in the clinic today and felt mildly lightheaded.  She does not have a pressure cuff at home.  Denies any chest pain, shortness of breath, or hemoptysis.  She denies nausea/vomiting. She denies rashes or skin changes. She is here for her udenyca injection today and for a 1 week follow up visit to manage any adverse side effects of treatment.   MEDICAL HISTORY: Past Medical History:  Diagnosis Date   History of cardiovascular stress test    done in preparation of surgery- 05/01/2012   MI, old 9/12   signed out ama-has never gone back to have worked up    ALLERGIES:  has No Known Allergies.  MEDICATIONS:  Current Outpatient Medications  Medication Sig Dispense Refill   acetaminophen (TYLENOL) 500 MG tablet Take 1,000 mg by mouth every 4 (four) hours as needed. For pain     folic acid (FOLVITE) 1 MG tablet Take 1 tablet (1 mg total) by mouth daily. 90 tablet 0   guaifenesin (ROBITUSSIN) 100 MG/5ML syrup Take 200 mg by mouth 3 (three) times daily as needed for cough.     lidocaine-prilocaine (EMLA) cream Apply 1 Application topically as needed. 30 g 2   nicotine (NICODERM CQ) 7 mg/24hr patch Place 1 patch (7 mg total) onto the skin daily. (Patient not taking: Reported on 09/14/2022) 28 patch 0   nicotine polacrilex (NICORETTE) 4 MG gum Take 1 each (4 mg total) by mouth as needed for smoking cessation. (Patient not taking: Reported on 09/14/2022) 30 tablet 2   prochlorperazine (COMPAZINE) 10 MG tablet Take 1 tablet (10  mg total) by mouth every 6 (six) hours as needed. (Patient not taking: Reported on 09/14/2022) 30 tablet 2   thiamine (VITAMIN B-1) 100 MG tablet Take 1 tablet (100 mg total) by mouth daily. 90 tablet 0   No current facility-administered medications for this visit.    SURGICAL HISTORY:  Past Surgical History:  Procedure Laterality Date   BRONCHIAL NEEDLE ASPIRATION BIOPSY  09/02/2022   Procedure: BRONCHIAL NEEDLE ASPIRATION BIOPSIES;  Surgeon: Josephine IgoIcard, Bradley L, DO;  Location: [facility];  Service: Cardiopulmonary;;   MULTIPLE TOOTH EXTRACTIONS     ORIF ANKLE FRACTURE  05/03/2012   Procedure: OPEN REDUCTION INTERNAL FIXATION  (ORIF) ANKLE FRACTURE;  Surgeon: Toni ArthursJohn Hewitt, MD;  Location: [facility] OR;  Service: Orthopedics;  Laterality: Left;   VIDEO BRONCHOSCOPY WITH ENDOBRONCHIAL ULTRASOUND Bilateral 09/02/2022   Procedure: VIDEO BRONCHOSCOPY WITH ENDOBRONCHIAL ULTRASOUND;  Surgeon: Josephine IgoIcard, Bradley L, DO;  Location: [facility];  Service: Cardiopulmonary;  Laterality: Bilateral;    REVIEW OF SYSTEMS:   Constitutional: Positive for fatigue. Negative for chills, fever and unexpected weight change.  HENT: Negative for mouth sores, nosebleeds, sore throat and trouble swallowing.   Eyes: Negative for eye problems and icterus.  Respiratory: Positive for stable cough. Negative for hemoptysis, shortness of breath and wheezing.   Cardiovascular: Negative for chest pain and leg swelling.  Gastrointestinal: Positive for diarrhea last 2 weeks or so. Negative for abdominal pain, constipation, nausea and vomiting.  Genitourinary: Negative for bladder incontinence, difficulty urinating, dysuria, frequency and hematuria.   Musculoskeletal: Negative for back pain, gait problem, neck pain and neck stiffness.  Skin: Negative for itching and rash.  Neurological: Positive for mild lightheadedness. Negative for dizziness, extremity weakness, gait problem, headaches, and seizures.  Hematological: Negative for adenopathy. Does not bruise/bleed easily.  Psychiatric/Behavioral: Negative for confusion, depression and sleep disturbance. The patient is not nervous/anxious.     PHYSICAL EXAMINATION:  There were no vitals taken for this visit.  ECOG PERFORMANCE STATUS: 1  Physical Exam  Constitutional: Oriented to person, place, and time and thin appearing female and in no distress.   HENT:  Head: Normocephalic and atraumatic.  Mouth/Throat: Oropharynx is clear and moist. No oropharyngeal exudate.  Eyes: Conjunctivae are normal. Right eye exhibits no discharge. Left eye exhibits no discharge. No scleral icterus.  Neck: Normal range of motion.  Neck supple.  Cardiovascular: Normal rate, regular rhythm, normal heart sounds and intact distal pulses.   Pulmonary/Chest: Effort normal and breath sounds normal. No respiratory distress. No wheezes. No rales.  Abdominal: Soft. Bowel sounds are normal. Exhibits no distension and no mass. There is no tenderness.  Musculoskeletal: Normal range of motion. Exhibits no edema.  Lymphadenopathy:    No cervical adenopathy.  Neurological: Alert and oriented to person, place, and time. Exhibits normal muscle tone. Gait normal. Coordination normal.  Skin: Skin is warm and dry. No rash noted. Not diaphoretic. No erythema. No pallor.  Psychiatric: Mood, memory and judgment normal.  Vitals reviewed.  LABORATORY DATA: Lab Results  Component Value Date   WBC 3.1 (L) 09/19/2022   HGB 9.9 (L) 09/19/2022   HCT 30.3 (L) 09/19/2022   MCV 91.8 09/19/2022   PLT 342 09/19/2022      Chemistry      Component Value Date/Time   NA 133 (L) 09/19/2022 1101   K 3.7 09/19/2022 1101   CL 103 09/19/2022 1101   CO2 23 09/19/2022 1101   BUN 18 09/19/2022 1101   CREATININE 0.91 09/19/2022 1101  Component Value Date/Time   CALCIUM 9.5 09/19/2022 1101   ALKPHOS 46 09/19/2022 1101   AST 17 09/19/2022 1101   ALT 12 09/19/2022 1101   BILITOT 0.3 09/19/2022 1101       RADIOGRAPHIC STUDIES:  CT Chest Wo Contrast  Result Date: 09/01/2022 CLINICAL DATA:  67 year old female presents with suspected pneumonia. * Tracking Code: BO * EXAM: CT CHEST WITHOUT CONTRAST TECHNIQUE: Multidetector CT imaging of the chest was performed following the standard protocol without IV contrast. RADIATION DOSE REDUCTION: This exam was performed according to the departmental dose-optimization program which includes automated exposure control, adjustment of the mA and/or kV according to patient size and/or use of iterative reconstruction technique. COMPARISON:  None available. FINDINGS: Cardiovascular: Small pericardial effusion,  question pericardial thickening on noncontrast imaging. Heart size is normal. Three-vessel coronary artery disease. Calcified aortic atherosclerosis without aneurysmal dilation. Central pulmonary vasculature is normal caliber though obscured by RIGHT hilar mass. Limited assessment of vascular structures and cardiac structures due to lack of intravenous contrast. Mediastinum/Nodes: Bulky RIGHT paratracheal nodal mass measuring 3.6 x 4.6 cm (image 55/3) This is nearly confluent with additional nodal tissue that measures up to 2.8 cm on image 46/3. Image 71/3 subcarinal adenopathy measuring 1.7 cm. Bulky RIGHT hilar adenopathy measuring up to 2.2 cm greatest thickness. This is confluent with airspace disease in the RIGHT chest. Scattered bilateral axillary lymph nodes mainly less than a cm, those above a cm show fatty hila. Lungs/Pleura: 35/4, 1.6 x 1.5 cm RIGHT upper lobe nodule. Image 81/4 4.6 x 4.8 cm RIGHT middle lobe airspace disease with infiltrative appearance and surrounding diffuse septal thickening. Distortion and nodularity of the minor fissure and loculated appearing area in the major fissure on image 85/4 showing lower density and measuring 4.8 x 3.8 cm. Other loculated areas of pleural fluid in the RIGHT chest are also noted with small to moderate volume overall with loculated sub pulmonic component as the largest area of discrete pleural fluid in the RIGHT chest. Subtle indistinct areas of nodularity in the LEFT lower lobe 6 x 5 mm (image 74/4) airways are patent. Upper Abdomen: Low-density lesion in the LEFT hepatic lobe measures 9 mm. Area shows low-density, perhaps water density though image noise makes assessment difficult. Adrenal glands are incompletely assessed, no gross mass. No signs of upper abdominal adenopathy. Musculoskeletal: No acute musculoskeletal process. Spinal degenerative changes. Osteopenia. Lytic area in the T2 vertebral body measuring 4-5 mm. Otherwise no destructive bone findings  are noted IMPRESSION: 1. Findings suspicious for bronchogenic neoplasm in the RIGHT chest associated with multifocal pulmonary involvement and bulky mediastinal adenopathy including LEFT paratracheal adenopathy. 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 2. Loculated pleural fluid likely explained by presence of neoplasm with malignant effusion. Would also correlate with any signs of infection. 3. Irregular pericardial thickening is suggested as well. 4. Top-normal bilateral axillary nodal tissue most of which retains fatty hila. These are nonspecific. Given extensive disease in the chest the patient may benefit from PET imaging for further staging when the patient is able. 5. Small pericardial effusion, question pericardial thickening on noncontrast imaging. 6. Subtle indistinct areas of nodularity in the LEFT lower lobe measuring 6 x 5 mm. Could indicate developing multifocal neoplasm also involving the LEFT chest. 7. Low-density hepatic lesion not well evaluated. Further evaluation with dedicated abdominal imaging or PET when the patient is able is suggested. 8. Lytic lesion at T2 could reflect early bony metastatic disease. 9. Three-vessel coronary artery disease and aortic atherosclerosis. Aortic  Atherosclerosis (ICD10-I70.0). Electronically Signed   By: Donzetta Kohut M.D.   On: 09/01/2022 13:58   DG Chest 2 View  Result Date: 09/01/2022 CLINICAL DATA:  Chronic cough.  History of smoking EXAM: CHEST - 2 VIEW COMPARISON:  02/18/2011 FINDINGS: Heart size within normal limits. Mass-like opacity in the right mid lung field measuring approximately 6.5 x 4.5 cm. Enlargement of the right hilum and right paratracheal stripe suggesting of underlying adenopathy. Streaky interstitial opacities throughout the right mid to lower lung field. Small to moderate right-sided pleural effusion. Left lung is clear. IMPRESSION: Mass-like opacity in the right mid lung field measuring approximately 6.5 x 4.5 cm.  Enlargement of the right hilum and right paratracheal stripe suggesting underlying adenopathy. Findings are most concerning for malignancy. Chest CT with IV contrast is recommended for further evaluation. These results will be called to the ordering clinician or representative by the Radiologist Assistant, and communication documented in the PACS or [employer]. Electronically Signed   By: Duanne Guess D.O.   On: 09/01/2022 12:13     ASSESSMENT/PLAN:  This is a very pleasant 67 year old female with extensive stage small cell lung cancer (T3, N3, M1 C) . She presented with large right lower lobe lung masses in addition to bulky right hilar and mediastinal lymphadenopathy as well as suspicious lytic bone lesion at L1. She was diagnosed in March 2024.   She is currently on systemic chemotherapy with carboplatin for AUC of 4 on day 1 and etoposide 80 Mg/M2 on days 1, 2 and 3 with Neulasta support on day 5 as well as Imfinzi 1500 Mg IV on day 1 every 3 weeks.  With starting with the reduced dose of carboplatin and etoposide side because of her HIV status.   She is here for [place] today. She was supposed to have a one week follow up toxicity check with a provider. Her BP was initially noted to be low at 89/74 but was rechecked and was 105/62. She had some mild associated lightheadedness. She was first evaluated by the [facility] provider. ER room visit was initially recommended but the patient declined. Then her BP was rechecked at 105/62. Instead, she received 1 L of fluids in the clinic today.   The patient overall tolerated her first cycle of treatment well last week without any adverse side effects except for fatigue.   Her only concern today is with ongoing diarrhea, which started around 09/05/22. She only has mild improvement with taking imodium. I confirmed she is taking this appropriately.   We will arrange for GI pathogen panel and C diff testing to rule out infectious etiology. She is afebrile,  no tenderness on exam, and no dehydration/electrolyte derangements on labs. If no infectious etiology found, then I will send a prescription for lomotil.   Her brain MRI is still pending. I looked at her images which there is an area that looks abnormal to me in the right hemisphere. I let the patient know I will monitor for the results today and call her with the final radiology report today and our recommendation.  Her hypotension today may have been related to diarrhea. She tries to hydrate well at home. I encouraged her to purchase a BP cuff at home. Should she ever have significant hypotension at home, she was advised to seek emergency evaluation.   I also reviewed she should always call us with new signs of infection, given her risk of infection with HIV and immunocompromised state due to chemotherapy.  The patient was advised to call immediately if she has any concerning symptoms in the interval. The patient voices understanding of current disease status and treatment options and is in agreement with the current care plan. All questions were answered. The patient knows to call the clinic with any problems, questions or concerns. We can certainly see the patient much sooner if necessary     Orders Placed This Encounter  Procedures   C difficile quick screen w PCR reflex    Standing Status:   Future    Number of Occurrences:   1    Standing Expiration Date:   09/19/2023     The total time spent in the appointment was 30-39 minutes  Cherilyn Sautter L Adisen Bennion, PA-C 09/19/22

## 2022-09-19 NOTE — Telephone Encounter (Signed)
Tiffany calling with call report. For MRI. Tiffany phone number is [phone].

## 2022-09-19 NOTE — Patient Instructions (Signed)
Dehydration, Adult Dehydration is a condition in which there is not enough water or other fluids in the body. This happens when a person loses more fluids than they take in. Important organs cannot work right without the right amount of fluids. Any loss of fluids from the body can cause dehydration. Dehydration can be mild, worse, or very bad. It should be treated right away to keep it from getting very bad. What are the causes? Conditions that cause loss of water in the body. They include: Watery poop (diarrhea). Vomiting. Sweating a lot. Fever. Infection. Peeing (urinating) a lot. Not drinking enough fluids. Certain medicines, such as medicines that take extra fluid out of the body (diuretics). Lack of safe drinking water. Not being able to get enough water and food. What increases the risk? Having a long-term (chronic) illness that has not been treated the right way, such as: Diabetes. Heart disease. Kidney disease. Being 65 years of age or older. Having a disability. Living in a place that is high above the ground or sea (high in altitude). The thinner, drier air causes more fluid loss. Doing exercises that put stress on your body for a long time. Being active when in hot places. What are the signs or symptoms? Symptoms of dehydration depend on how bad it is. Mild or worse dehydration Thirst. Dry lips or dry mouth. Feeling dizzy or light-headed. Muscle cramps. Passing little pee or dark pee. Pee may be the color of tea. Headache. Very bad dehydration Changes in skin. Skin may: Be cold to the touch (clammy). Be blotchy or pale. Not go back to normal right after you pinch it and let it go. Little or no tears, pee, or sweat. Fast breathing. Low blood pressure. Weak pulse. Pulse that is more than 100 beats a minute when you are sitting still. Other changes, such as: Feeling very thirsty. Eyes that look hollow (sunken). Cold hands and feet. Being confused. Being very  tired (lethargic) or having trouble waking from sleep. Losing weight. Loss of consciousness. How is this treated? Treatment for this condition depends on how bad your dehydration is. Treatment should start right away. Do not wait until your condition gets very bad. Very bad dehydration is an emergency. You will need to go to a hospital. Mild or worse dehydration can be treated at home. You may be asked to: Drink more fluids. Drink an oral rehydration solution (ORS). This drink gives you the right amount of fluids, salts, and minerals (electrolytes). Very bad dehydration can be treated: With fluids through an IV tube. By correcting low levels of electrolytes in the body. By treating the problem that caused your dehydration. Follow these instructions at home: Oral rehydration solution If told by your doctor, drink an ORS: Make an ORS. Use instructions on the package. Start by drinking small amounts, about  cup (120 mL) every 5-10 minutes. Slowly drink more until you have had the amount that your doctor said to have.  Eating and drinking  Drink enough clear fluid to keep your pee pale yellow. If you were told to drink an ORS, finish the ORS first. Then, start slowly drinking other clear fluids. Drink fluids such as: Water. Do not drink only water. Doing that can make the salt (sodium) level in your body get too low. Water from ice chips you suck on. Fruit juice that you have added water to (diluted). Low-calorie sports drinks. Eat foods that have the right amounts of salts and minerals, such as bananas, oranges, potatoes,   tomatoes, or spinach. Do not drink alcohol. Avoid drinks that have caffeine or sugar. These include:: High-calorie sports drinks. Fruit juice that you did not add water to. Soda. Coffee or energy drinks. Avoid foods that are greasy or have a lot of fat or sugar. General instructions Take over-the-counter and prescription medicines only as told by your doctor. Do  not take sodium tablets. Doing that can make the salt level in your body get too high. Return to your normal activities as told by your doctor. Ask your doctor what activities are safe for you. Keep all follow-up visits. Your doctor may check and change your treatment. Contact a doctor if: You have pain in your belly (abdomen) and the pain: Gets worse. Stays in one place. You have a rash. You have a stiff neck. You get angry or annoyed more easily than normal. You are more tired or have a harder time waking than normal. You feel weak or dizzy. You feel very thirsty. Get help right away if: You have any symptoms of very bad dehydration. You vomit every time you eat or drink. Your vomiting gets worse, does not go away, or you vomit blood or green stuff. You are getting treatment, but symptoms are getting worse. You have a fever. You have a very bad headache. You have: Diarrhea that gets worse or does not go away. Blood in your poop (stool). This may cause poop to look black and tarry. No pee in 6-8 hours. Only a small amount of pee in 6-8 hours, and the pee is very dark. You have trouble breathing. These symptoms may be an emergency. Get help right away. Call 911. Do not wait to see if the symptoms will go away. Do not drive yourself to the hospital. This information is not intended to replace advice given to you by your health care provider. Make sure you discuss any questions you have with your health care provider. Document Revised: 12/27/2021 Document Reviewed: 12/27/2021 Elsevier Patient Education  2023 Elsevier Inc.  

## 2022-09-19 NOTE — Telephone Encounter (Signed)
Tried to contact patient again for possible virtual visit.   She did not answer. No ability to LVM.   Will attempt to reach patient at later time to schedule follow up.  Veronda Prude, RN

## 2022-09-19 NOTE — Telephone Encounter (Signed)
Attempted to call patient back to discuss options.   She did not answer. Will attempt to call back later.   Veronda Prude, RN

## 2022-09-19 NOTE — Telephone Encounter (Signed)
This nurse spoke with patient and made her aware of MRI results showing possible stroke in the past couple of weeks.  Advised the provider wants her to take Aspirin 325 mg daily.  This nurse educated patient on FAST, stroke symptoms.  She acknowledged understanding.  Made aware that she is being referred to neurology oncologist.  No further questions or concerns noted at this time.

## 2022-09-19 NOTE — Telephone Encounter (Signed)
Call report sent to Dr Tonia Brooms.

## 2022-09-19 NOTE — Telephone Encounter (Signed)
Patient calls nurse line due to difficulty with transportation for appointment this morning.   Advised patient that appointment is scheduled with our office for this afternoon at 1:30. She states that she was supposed to be receiving injections at Cancer center. She was told that a ride had been scheduled for this morning and she has been waiting outside for two hours.   Reports that she is "really not feeling well." Feels like she is about to pass out. Advised that patient return inside and sit down. Patient returned inside. Initially, patient sounded short of breath. Advised that we should call 911 due to shortness of breath and weakness. Patient declined stating that she was fine after resting. After several minutes, patient's breathing sounded improved over the phone. Patient spoke in complete sentences for our conversation. She states that she will not be able to come in for new patient appointment this afternoon. Attempted to inform patient of new patient cancellation policy, patient then stated "how am I supposed to get there, walk?"  Advised that I would reach out to PCP and practice administrator regarding cancellation. Provided patient with strict ED precautions.   Patient returned call regarding ride to cancer center. Patient states that she has been trying to contact cancer center, however, unable to reach. Called cancer center and was able to get ride rescheduled for patient.   Called patient and advised.   Patient appreciative.   Veronda Prude, RN

## 2022-09-20 ENCOUNTER — Telehealth: Payer: Self-pay | Admitting: Internal Medicine

## 2022-09-20 ENCOUNTER — Other Ambulatory Visit (HOSPITAL_COMMUNITY): Payer: Self-pay

## 2022-09-20 ENCOUNTER — Inpatient Hospital Stay: Payer: 59

## 2022-09-20 ENCOUNTER — Other Ambulatory Visit: Payer: Self-pay | Admitting: Physician Assistant

## 2022-09-20 ENCOUNTER — Telehealth: Payer: Self-pay

## 2022-09-20 DIAGNOSIS — R197 Diarrhea, unspecified: Secondary | ICD-10-CM

## 2022-09-20 MED ORDER — DIPHENOXYLATE-ATROPINE 2.5-0.025 MG PO TABS
1.0000 | ORAL_TABLET | Freq: Four times a day (QID) | ORAL | 0 refills | Status: DC | PRN
Start: 2022-09-20 — End: 2022-10-11

## 2022-09-20 NOTE — Telephone Encounter (Signed)
-----   Message from [place], PA-C sent at 09/20/2022  7:52 AM EDT ----- Can you let her know her C diff testing and GI pathogen panel were negative for infectious cause of diarrhea. I sent her a prescription for lomotil to also use with imodium for diarrhea. If no improvement, to call back ----- Message ----- From: Interface, Lab In [place] Sent: 09/19/2022   9:09 PM EDT To: Johnette Abraham Heilingoetter, PA-C

## 2022-09-20 NOTE — Telephone Encounter (Signed)
This nurse spoke with patient and made her aware that C-Diff and GI panel were negative for signs of infection.  Informed about Lomotil prescription sent to [facility].  Patient acknowledged understanding.  No further questions or concerns noted at this time.

## 2022-09-20 NOTE — Telephone Encounter (Signed)
scheduled per 4/8 referral, pt has been called and confirmed date and time. Pt is aware of location and to arrive early for check in   

## 2022-09-20 NOTE — Telephone Encounter (Signed)
[facility] Patient Advocate Encounter  Insurance verification completed.    The patient is insured through [employer] and has a $0.00 copay.  We will continue to follow to see if copay assistance is needed.  Artis Beggs, CPhT [facility] Patient Advocate [facility] Phone: [phone] Fax:  [phone]  

## 2022-09-20 NOTE — Progress Notes (Unsigned)
[facility]  Reason for Consult: HIV new patient Referring Provider: Dr Larita Fife, FMTS   HPI:    [name] is a 67 y.o. female with a past medical history as below who presents to clinic as a new patient for HIV care.    Patient was recently admitted to [facility] from 09/01/22-09/04/22 due to SOB and hemoptysis.  Found to have a lung mass that was biopsied and diagnosed with extensive stage small cell lung cancer.  She has been started on chemotherapy and following with oncology.  This has been complicated by fatigue and diarrhea.  Recent C diff testing was negative.   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  She reports prior negative HIV test but over 10 years ago.  She has been sexually active with female partners in the past but not for over 12 months.  She has prior history of alcohol use, remote history of crack cocaine use, and smoking but no history of IVDU.   HIV diagnosed: March 2024 CD4 at diagnosis:311 VL at diagnosis: 5,350 copies Prior ART: Treatment naive  Current ART: Starting Dovato 09/21/22 Hx of OI: None, Diagnosed with metastatic small cell lung cancer March 2024 Hx of STI: None Risk factors: Heterosexual partners  Genotype Hx: - Obtaining 09/20/22  Patient's Medications  New Prescriptions   DOLUTEGRAVIR-LAMIVUDINE (DOVATO) 50-300 MG TABLET    Take 1 tablet by mouth daily.  Previous Medications   ACETAMINOPHEN (TYLENOL) 500 MG TABLET    Take 1,000 mg by mouth every 4 (four) hours as needed. For pain   DIPHENOXYLATE-ATROPINE (LOMOTIL) 2.5-0.025 MG TABLET    Take 1 tablet by mouth 4 (four) times daily as needed for diarrhea or loose stools.   FOLIC ACID (FOLVITE) 1 MG TABLET    Take 1 tablet (1 mg total) by mouth daily.   GUAIFENESIN (ROBITUSSIN) 100 MG/5ML SYRUP    Take 200 mg by mouth 3 (three) times daily as needed for cough.   LIDOCAINE-PRILOCAINE  (EMLA) CREAM    Apply 1 Application topically as needed.   NICOTINE (NICODERM CQ) 7 MG/24HR PATCH    Place 1 patch (7 mg total) onto the skin daily.   NICOTINE POLACRILEX (NICORETTE) 4 MG GUM    Take 1 each (4 mg total) by mouth as needed for smoking cessation.   PROCHLORPERAZINE (COMPAZINE) 10 MG TABLET    Take 1 tablet (10 mg total) by mouth every 6 (six) hours as needed.   THIAMINE (VITAMIN B-1) 100 MG TABLET    Take 1 tablet (100 mg total) by mouth daily.  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   signed out ama-has never gone back to have worked up    Social History   Tobacco Use   Smoking status: Every Day    Packs/day: 0.50    Years: 30.00    Additional pack years: 0.00    Total pack years: 15.00    Types: Cigarettes  Vaping Use   Vaping Use: Never used  Substance Use Topics   Alcohol use: Not Currently    Comment: 3x's / month  Drug use: Not Currently    Types: Cocaine, "Crack" cocaine    Comment: reports she quit smoking crack cocaine around 2020    Family History  Problem Relation Age of Onset   Hypertension Mother    Diabetes Mother    Heart disease Sister    Heart disease Sister     No Known Allergies  ROS   OBJECTIVE:    Vitals:   09/21/22 0906  BP: (!) 85/54  Pulse: 92  Temp: 97.9 F (36.6 C)  TempSrc: Temporal  SpO2: 98%  Weight: 130 lb (59 kg)  Height: 5\' 9"  (1.753 m)     Body mass index is 19.2 kg/m.   Physical Exam  Labs and Microbiology:    Latest Ref Rng & Units 09/19/2022   11:01 AM 09/14/2022    8:20 AM 09/03/2022    2:08 AM  CMP  Glucose 70 - 99 mg/dL 578101  73  [id number]   BUN 8 - 23 mg/dL 18  10  20    Creatinine 0.44 - 1.00 mg/dL 6.290.91  5.280.63  4.130.66   Sodium 135 - 145 mmol/L 133  135  133   Potassium 3.5 - 5.1 mmol/L 3.7  3.3   4.5   Chloride 98 - 111 mmol/L 103  107  108   CO2 22 - 32 mmol/L 23  24  20    Calcium 8.9 - 10.3 mg/dL 9.5  9.2  8.7   Total Protein 6.5 - 8.1 g/dL 8.6  9.2  8.2   Total Bilirubin 0.3 - 1.2 mg/dL 0.3  0.2  0.4   Alkaline Phos 38 - 126 U/L 46  51  53   AST 15 - 41 U/L 17  14  34   ALT 0 - 44 U/L 12  11  23        Latest Ref Rng & Units 09/19/2022   11:01 AM 09/14/2022    8:20 AM 09/03/2022    2:08 AM  CBC  WBC 4.0 - 10.5 K/uL 3.1  2.6  8.2   Hemoglobin 12.0 - 15.0 g/dL 9.9  9.4  9.0   Hematocrit 36.0 - 46.0 % 30.3  29.4  28.1   Platelets 150 - 400 K/uL 342  413  320      Lab Results  Component Value Date   HIV1RNAQUANT 5,350 09/02/2022   CD4TABS 311 (L) 09/02/2022    RPR and STI: No results found for: "LABRPR", "RPRTITER"      No data to display          Hepatitis B: No results found for: "HEPBSAB", "HEPBSAG", "HEPBCAB" Hepatitis C: No results found for: "HEPCAB", "HCVRNAPCRQN" Hepatitis A: No results found for: "HAV" Lipids: Lab Results  Component Value Date   CHOL 200 02/19/2011   TRIG 128 02/19/2011   HDL 45 02/19/2011   CHOLHDL 4.4 02/19/2011   VLDL 26 02/19/2011   LDLCALC 129 (H) 02/19/2011      ASSESSMENT & PLAN:    HIV (human immunodeficiency virus infection) She is here today as a new patient and will check intake labs and start on Dovato today.  Will check genotype and hepatitis B serology to ensure no issues.  Follow up in 6 weeks.   Small cell lung cancer [facility](HCC) She is following with oncology and on chemotherapy.  No major DDI noted with her chemotherapy and Dovato.   Routine screening for STI (sexually transmitted infection) Will screen today.   Orders Placed This Encounter  Procedures  CBC WITH DIFFERENTIAL/PLATELET   COMPLETE METABOLIC PANEL WITH GFR   RPR   HEPATITIS B SURFACE ANTIGEN   HEPATITIS B SURFACE ANTIBODY   HEPATITIS B CORE ANTIBODY, TOTAL   HEPATITIS C ANTIBODY   HEPATITIS A ANTIBODY, TOTAL   URINALYSIS   LIPID PANEL    HIV antibody   QuantiFERON-TB Gold Plus   T-helper cell (CD4)- ([facility] clinic only)   HIV-1 Genotyping (RTI,PI,IN Josefina Do)   HIV-1 RNA quant-no reflex-bld     Vedia Coffer for Infectious Disease [facility] 09/21/2022, 9:28 AM

## 2022-09-21 ENCOUNTER — Other Ambulatory Visit (HOSPITAL_COMMUNITY)
Admission: RE | Admit: 2022-09-21 | Discharge: 2022-09-21 | Disposition: A | Discharge status: Home / Self Care | Payer: 59 | Source: Ambulatory Visit | Attending: Internal Medicine | Admitting: Internal Medicine

## 2022-09-21 ENCOUNTER — Other Ambulatory Visit: Payer: Self-pay

## 2022-09-21 ENCOUNTER — Other Ambulatory Visit (HOSPITAL_COMMUNITY): Payer: Self-pay

## 2022-09-21 ENCOUNTER — Encounter: Payer: Self-pay | Admitting: Internal Medicine

## 2022-09-21 ENCOUNTER — Ambulatory Visit (INDEPENDENT_AMBULATORY_CARE_PROVIDER_SITE_OTHER): Payer: 59 | Admitting: Pharmacist

## 2022-09-21 ENCOUNTER — Ambulatory Visit (INDEPENDENT_AMBULATORY_CARE_PROVIDER_SITE_OTHER): Payer: 59 | Admitting: Internal Medicine

## 2022-09-21 ENCOUNTER — Ambulatory Visit: Payer: 59

## 2022-09-21 ENCOUNTER — Other Ambulatory Visit: Payer: Self-pay | Admitting: Family Medicine

## 2022-09-21 ENCOUNTER — Encounter (HOSPITAL_COMMUNITY): Payer: Self-pay

## 2022-09-21 VITALS — BP 99/68 | HR 92 | Temp 97.9°F | Ht 69.0 in | Wt 130.0 lb

## 2022-09-21 DIAGNOSIS — Z21 Asymptomatic human immunodeficiency virus [HIV] infection status: Secondary | ICD-10-CM

## 2022-09-21 DIAGNOSIS — C3490 Malignant neoplasm of unspecified part of unspecified bronchus or lung: Secondary | ICD-10-CM

## 2022-09-21 DIAGNOSIS — Z113 Encounter for screening for infections with a predominantly sexual mode of transmission: Secondary | ICD-10-CM | POA: Diagnosis not present

## 2022-09-21 LAB — URINALYSIS
Bilirubin Urine: NEGATIVE
Glucose, UA: NEGATIVE
Ketones, ur: NEGATIVE
Nitrite: NEGATIVE
Specific Gravity, Urine: 1.026 (ref 1.001–1.035)
pH: 5.5 (ref 5.0–8.0)

## 2022-09-21 LAB — CBC WITH DIFFERENTIAL/PLATELET
Eosinophils Absolute: 57 cells/uL (ref 15–500)
Monocytes Relative: 1.1 %
Platelets: 281 10*3/uL (ref 140–400)
RDW: 12.4 % (ref 11.0–15.0)

## 2022-09-21 MED ORDER — DOLUTEGRAVIR-LAMIVUDINE 50-300 MG PO TABS
1.0000 | ORAL_TABLET | Freq: Every day | ORAL | 11 refills | Status: DC
  Filled 2022-09-21 – 2022-09-22 (×2): qty 30, 30d supply, fill #0
  Filled 2022-10-20: qty 30, 30d supply, fill #1
  Filled 2022-11-10: qty 30, 30d supply, fill #2
  Filled 2022-12-14: qty 30, 30d supply, fill #3
  Filled 2023-02-01: qty 30, 30d supply, fill #4
  Filled 2023-02-27: qty 30, 30d supply, fill #5
  Filled 2023-03-22: qty 30, 30d supply, fill #6
  Filled 2023-04-15 – 2023-05-03 (×3): qty 30, 30d supply, fill #7
  Filled 2023-05-30: qty 30, 30d supply, fill #8
  Filled 2023-06-22: qty 30, 30d supply, fill #9

## 2022-09-21 MED ORDER — DOLUTEGRAVIR-LAMIVUDINE 50-300 MG PO TABS: 1.0000 | ORAL_TABLET | Freq: Every day | ORAL | 11 refills | Status: DC

## 2022-09-21 NOTE — Progress Notes (Signed)
09/21/2022  HPI: [name] is a 67 y.o. female who presents to the [facility] clinic today to initiate care for a newly diagnosed HIV infection.  Patient Active Problem List   Diagnosis Date Noted   HIV test positive 09/06/2022   Small cell lung cancer 09/06/2022   Goals of care, counseling/discussion 09/06/2022   HIV (human immunodeficiency virus infection) 09/03/2022   Chronic respiratory failure with hypoxia 09/03/2022   Lung mass 09/01/2022   Sepsis 09/01/2022   Alcohol withdrawal 09/01/2022   Preop cardiovascular exam 04/26/2012   Chest pain 04/26/2012   Tachycardia 04/26/2012   Smoking 04/26/2012    Patient's Medications  New Prescriptions   No medications on file  Previous Medications   ACETAMINOPHEN (TYLENOL) 500 MG TABLET    Take 1,000 mg by mouth every 4 (four) hours as needed. For pain   DIPHENOXYLATE-ATROPINE (LOMOTIL) 2.5-0.025 MG TABLET    Take 1 tablet by mouth 4 (four) times daily as needed for diarrhea or loose stools.   FOLIC ACID (FOLVITE) 1 MG TABLET    Take 1 tablet (1 mg total) by mouth daily.   GUAIFENESIN (ROBITUSSIN) 100 MG/5ML SYRUP    Take 200 mg by mouth 3 (three) times daily as needed for cough.   LIDOCAINE-PRILOCAINE (EMLA) CREAM    Apply 1 Application topically as needed.   NICOTINE (NICODERM CQ) 7 MG/24HR PATCH    Place 1 patch (7 mg total) onto the skin daily.   NICOTINE POLACRILEX (NICORETTE) 4 MG GUM    Take 1 each (4 mg total) by mouth as needed for smoking cessation.   PROCHLORPERAZINE (COMPAZINE) 10 MG TABLET    Take 1 tablet (10 mg total) by mouth every 6 (six) hours as needed.   THIAMINE (VITAMIN B-1) 100 MG TABLET    Take 1 tablet (100 mg total) by mouth daily.  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   signed out ama-has never gone back to have worked up    Social History: Social History   Socioeconomic History   Marital status: Widowed    Spouse name: Not on file   Number of children: Not on file   Years of education: Not on file   Highest education level: Not on file  Occupational History   Not on file  Tobacco Use   Smoking status: Every Day    Packs/day: 1.00    Years: 30.00    Additional pack years: 0.00    Total pack years: 30.00    Types: Cigarettes   Smokeless tobacco: Not on file  Vaping Use   Vaping Use: Never used  Substance and Sexual Activity   Alcohol use: Yes    Comment: 3x's / month    Drug use: Not Currently    Types: Cocaine    Comment: 04/29/2012   Sexual activity: Not Currently  Other Topics Concern   Not on file  Social History Narrative   Not on file   Social Determinants of Health   Financial Resource Strain: Not on file  Food Insecurity: No Food Insecurity (09/02/2022)   Hunger Vital Sign    Worried About Running Out of Food in the Last Year: Never true  Ran Out of Food in the Last Year: Never true  Transportation Needs: No Transportation Needs (09/02/2022)   PRAPARE - Administrator, Civil Service (Medical): No    Lack of Transportation (Non-Medical): No  Physical Activity: Not on file  Stress: Not on file  Social Connections: Not on file    Labs: Lab Results  Component Value Date   HIV1RNAQUANT 5,350 09/02/2022   CD4TABS 311 (L) 09/02/2022    RPR and STI No results found for: "LABRPR", "RPRTITER"      No data to display          Hepatitis B No results found for: "HEPBSAB", "HEPBSAG", "HEPBCAB" Hepatitis C No results found for: "HEPCAB", "HCVRNAPCRQN" Hepatitis A No results found for: "HAV" Lipids: Lab Results  Component Value Date   CHOL 200 02/19/2011   TRIG 128 02/19/2011   HDL 45 02/19/2011   CHOLHDL 4.4 02/19/2011   VLDL 26 02/19/2011   LDLCALC 129 (H) 02/19/2011     Current HIV Regimen: Treatment naive  Assessment: [name] is here today to initiate care with Dr. Earlene Plater for her newly diagnosed HIV infection.  She is treatment naive with an initial HIV viral load of 5,350 and a CD4 count of 311. Will start patient on Dovato.  Provided counseling to patient including how to take the medication, the importance of daily dosing, and to avoid over the counter supplements with metals in them and gave examples. I let her know that her cancer treatments will not interfere with Dovato. She was engaged and asked questions, all questions answered.   Plan: - Start Dovato - Call with any questions or concerns   Blane Ohara, PharmD  PGY1 Pharmacy Resident

## 2022-09-21 NOTE — Assessment & Plan Note (Signed)
She is following with oncology and on chemotherapy.  No major DDI noted with her chemotherapy and Dovato.

## 2022-09-21 NOTE — Assessment & Plan Note (Signed)
She is here today as a new patient and will check intake labs and start on Dovato today.  Will check genotype and hepatitis B serology to ensure no issues.  Follow up in 6 weeks.

## 2022-09-21 NOTE — Progress Notes (Signed)
Thoughts on MRI results? I presume this is mets.  Thanks,  BLI  Josephine Igo, DO [facility] 09/21/2022 5:15 PM

## 2022-09-21 NOTE — Assessment & Plan Note (Signed)
Will screen today 

## 2022-09-21 NOTE — Patient Instructions (Signed)
Start Dovato daily for HIV treatment.  Samples provided and medication sent to pharmacy.

## 2022-09-22 ENCOUNTER — Other Ambulatory Visit (HOSPITAL_COMMUNITY): Payer: Self-pay

## 2022-09-22 ENCOUNTER — Encounter: Payer: Self-pay | Admitting: Internal Medicine

## 2022-09-22 ENCOUNTER — Other Ambulatory Visit: Payer: Self-pay

## 2022-09-22 ENCOUNTER — Inpatient Hospital Stay: Payer: 59

## 2022-09-22 ENCOUNTER — Encounter (HOSPITAL_COMMUNITY): Payer: Self-pay

## 2022-09-22 ENCOUNTER — Ambulatory Visit (HOSPITAL_COMMUNITY)
Admission: RE | Admit: 2022-09-22 | Discharge: 2022-09-22 | Disposition: A | Discharge status: Home / Self Care | Payer: 59 | Source: Ambulatory Visit | Attending: Pulmonary Disease | Admitting: Pulmonary Disease

## 2022-09-22 DIAGNOSIS — C3490 Malignant neoplasm of unspecified part of unspecified bronchus or lung: Secondary | ICD-10-CM | POA: Diagnosis present

## 2022-09-22 LAB — LIPID PANEL
Total CHOL/HDL Ratio: 2.6 (calc) (ref <5.0)
Triglycerides: 71 mg/dL (ref <150)

## 2022-09-22 LAB — RPR: RPR Ser Ql: REACTIVE — AB

## 2022-09-22 LAB — T-HELPER CELL (CD4) - (RCID CLINIC ONLY)
CD4 % Helper T Cell: 40 % (ref 33–65)
CD4 T Cell Abs: 333 /uL — ABNORMAL LOW (ref 400–1790)

## 2022-09-22 LAB — COMPLETE METABOLIC PANEL WITH GFR
AG Ratio: 0.6 (calc) — ABNORMAL LOW (ref 1.0–2.5)
BUN: 11 mg/dL (ref 7–25)
CO2: 24 mmol/L (ref 20–32)
Chloride: 107 mmol/L (ref 98–110)
Creat: 0.53 mg/dL (ref 0.50–1.05)
Total Bilirubin: 0.2 mg/dL (ref 0.2–1.2)
Total Protein: 8.8 g/dL — ABNORMAL HIGH (ref 6.1–8.1)

## 2022-09-22 LAB — GLUCOSE, CAPILLARY: Glucose-Capillary: 81 mg/dL (ref 70–99)

## 2022-09-22 LAB — HEPATITIS B SURFACE ANTIGEN: Hepatitis B Surface Ag: NONREACTIVE

## 2022-09-22 LAB — RPR TITER: RPR Titer: 1:1 {titer} — ABNORMAL HIGH

## 2022-09-22 LAB — URINE CYTOLOGY ANCILLARY ONLY
Chlamydia: NEGATIVE
Comment: NEGATIVE
Comment: NEGATIVE
Comment: NORMAL
Neisseria Gonorrhea: NEGATIVE
Trichomonas: POSITIVE — AB

## 2022-09-22 MED ORDER — FLUDEOXYGLUCOSE F - 18 (FDG) INJECTION
6.5000 | Freq: Once | INTRAVENOUS | Status: AC
  Administered 2022-09-22: 6.5 via INTRAVENOUS

## 2022-09-23 ENCOUNTER — Other Ambulatory Visit: Payer: Self-pay | Admitting: Internal Medicine

## 2022-09-23 LAB — T PALLIDUM AB: T Pallidum Abs: POSITIVE — AB

## 2022-09-23 LAB — QUANTIFERON-TB GOLD PLUS
QuantiFERON-TB Gold Plus: NEGATIVE
TB2-NIL: 0 IU/mL

## 2022-09-23 LAB — HIV-1/2 AB - DIFFERENTIATION: HIV-1 antibody: POSITIVE — AB

## 2022-09-23 MED ORDER — METRONIDAZOLE 500 MG PO TABS: 500.0000 mg | ORAL_TABLET | Freq: Two times a day (BID) | ORAL | 0 refills | Status: DC

## 2022-09-23 NOTE — Telephone Encounter (Signed)
She has appointment scheduled on Tuesday next week with Dr. Barbaraann Cao! The patient is aware of referral. Nothing further needed.

## 2022-09-24 LAB — HIV-1 RNA QUANT-NO REFLEX-BLD: HIV-1 RNA Quant, Log: 3.49 Log cps/mL — ABNORMAL HIGH

## 2022-09-25 ENCOUNTER — Other Ambulatory Visit: Payer: Self-pay

## 2022-09-26 ENCOUNTER — Telehealth: Payer: Self-pay

## 2022-09-26 ENCOUNTER — Other Ambulatory Visit (HOSPITAL_COMMUNITY): Payer: Self-pay

## 2022-09-26 ENCOUNTER — Other Ambulatory Visit: Payer: Self-pay

## 2022-09-26 DIAGNOSIS — Z8619 Personal history of other infectious and parasitic diseases: Secondary | ICD-10-CM

## 2022-09-26 HISTORY — DX: Personal history of other infectious and parasitic diseases: Z86.19

## 2022-09-26 MED ORDER — METRONIDAZOLE 500 MG PO TABS
500.0000 mg | ORAL_TABLET | Freq: Two times a day (BID) | ORAL | 0 refills | Status: AC
  Filled 2022-09-26: qty 14, 7d supply, fill #0

## 2022-09-26 NOTE — Telephone Encounter (Signed)
Covering for [name] - Chart reviewed.   Confirmed tx history for syphilis in remote past ("30y ago"), given low titer, which more likely represents serofast titer and only a memory of old infection, I would feel comfortable deferring bicillin injections.   Thank you

## 2022-09-26 NOTE — Telephone Encounter (Signed)
Patient informed of holding off on the bicillin injections at this time. Gizelle Whetsel T Pricilla Loveless

## 2022-09-26 NOTE — Telephone Encounter (Signed)
-----   Message from Kathlynn Grate, DO sent at 09/23/2022 12:38 PM EDT ----- Can you please let patient know that some labs are pending but some have resulted that need follow up:  She tested positive for Trichomonas.  Recommend Metronidazole 500mg  PO BID x 7 days and I have sent in prescription to pharmacy.  She tested positive for syphilis.  She reported at initial visit no prior STI history.  Can you confirm no prior syphilis treatment and, if not, recommend Bicillin x 3 weekly doses.   Thanks, Greig Castilla

## 2022-09-26 NOTE — Telephone Encounter (Signed)
Error

## 2022-09-26 NOTE — Telephone Encounter (Signed)
I spoke with the patient and advised her of positive trich results. Metronidazole rx resent to [facility] to be mailed to the patient.  Patient also states she was treat over 30 years ago for syphilis. Would you still like to go ahead and treat her with Bicillin 2.4 MU x 3? Cristy Colmenares T Pricilla Loveless

## 2022-09-27 ENCOUNTER — Inpatient Hospital Stay: Payer: 59

## 2022-09-27 ENCOUNTER — Telehealth: Payer: Self-pay | Admitting: *Deleted

## 2022-09-27 ENCOUNTER — Inpatient Hospital Stay: Payer: 59 | Admitting: Internal Medicine

## 2022-09-27 NOTE — H&P (Incomplete)
Chief Complaint: Patient was seen in consultation today for poor venous access.  Referring Physician(s): Mohamed,M  Supervising Physician: Roanna Banning  Patient Status: [facility] - Out-pt  History of Present Illness: [name] is a 67 y.o. female with a past medical history significant for syphilis, HIV, MI and small cell lung cancer who presents today for port placement. [name] presented to urgent care on 09/01/22 for cough with blood tinged sputum, fever and chest pain x 10 days. She had a CXR which was concerning for a lung mass and was sent to the ED for further evaluation. CT chest w/o contrast was performed 09/01/22 which showed:  1. Findings suspicious for bronchogenic neoplasm in the RIGHT chest associated with multifocal pulmonary involvement and bulky mediastinal adenopathy including LEFT paratracheal adenopathy. 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 2. Loculated pleural fluid likely explained by presence of neoplasm with malignant effusion. Would also correlate with any signs of infection. 3. Irregular pericardial thickening is suggested as well. 4. Top-normal bilateral axillary nodal tissue most of which retains fatty hila. These are nonspecific. Given extensive disease in the chest the patient may benefit from PET imaging for further staging when the patient is able. 5. Small pericardial effusion, question pericardial thickening on noncontrast imaging. 6. Subtle indistinct areas of nodularity in the LEFT lower lobe measuring 6 x 5 mm. Could indicate developing multifocal neoplasm also involving the LEFT chest. 7. Low-density hepatic lesion not well evaluated. Further evaluation with dedicated abdominal imaging or PET when the patient is able is suggested. 8. Lytic lesion at T2 could reflect early bony metastatic disease. 9. Three-vessel coronary artery disease and aortic atherosclerosis.  She was admitted and underwent bronchoscopy and biopsy  on 09/02/22 with pathology revealing small cell lung cancer. She was referred to oncology and is currently undergoing chemotherapy. She has had issues with peripheral IV placement for chemotherapy and IR has been consulted for port placement for durable venous access.  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/12/2011   signed out ama-has never gone back to have worked up    Past Surgical History:  Procedure Laterality Date   BRONCHIAL NEEDLE ASPIRATION BIOPSY  09/02/2022   Procedure: BRONCHIAL NEEDLE ASPIRATION BIOPSIES;  Surgeon: Josephine Igo, DO;  Location: [facility] ENDOSCOPY;  Service: Cardiopulmonary;;   MULTIPLE TOOTH EXTRACTIONS     ORIF ANKLE FRACTURE  05/03/2012   Procedure: OPEN REDUCTION INTERNAL FIXATION (ORIF) ANKLE FRACTURE;  Surgeon: Toni Arthurs, MD;  Location: [facility] OR;  Service: Orthopedics;  Laterality: Left;   VIDEO BRONCHOSCOPY WITH ENDOBRONCHIAL ULTRASOUND Bilateral 09/02/2022   Procedure: VIDEO BRONCHOSCOPY WITH ENDOBRONCHIAL ULTRASOUND;  Surgeon: Josephine Igo, DO;  Location: [facility] ENDOSCOPY;  Service: Cardiopulmonary;  Laterality: Bilateral;    Allergies: Patient has no known allergies.  Medications: Prior to Admission medications   Medication Sig Start Date End Date Taking? Authorizing Provider  acetaminophen (TYLENOL) 500 MG tablet Take 1,000 mg by mouth every 4 (four) hours as needed. For pain    [provider]  diphenoxylate-atropine (LOMOTIL) 2.5-0.025 MG tablet Take 1 tablet by mouth 4 (four) times daily as needed for diarrhea or loose stools. Patient not taking: Reported on 09/21/2022 09/20/22   Heilingoetter, Cassandra L, PA-C  dolutegravir-lamiVUDine (DOVATO) 50-300 MG tablet Take 1 tablet by mouth daily. 09/21/22 09/16/23  Kathlynn Grate, DO  folic acid (  FOLVITE) 1 MG tablet Take 1 tablet (1 mg total) by mouth daily. 09/04/22    [name], MD  guaifenesin (ROBITUSSIN) 100 MG/5ML syrup Take 200 mg by mouth 3 (three) times daily as needed for cough. Patient not taking: Reported on 09/21/2022    [provider]  lidocaine-prilocaine (EMLA) cream Apply 1 Application topically as needed. 09/15/22   Heilingoetter, Cassandra L, PA-C  metroNIDAZOLE (FLAGYL) 500 MG tablet Take 1 tablet (500 mg total) by mouth 2 (two) times daily for 7 days. Please Mail to the patient 09/26/22 10/03/22  Kathlynn Grate, DO  nicotine (NICODERM CQ) 7 mg/24hr patch Place 1 patch (7 mg total) onto the skin daily. Patient not taking: Reported on 09/21/2022 09/06/22   Heilingoetter, Cassandra L, PA-C  nicotine polacrilex (NICORETTE) 4 MG gum Take 1 each (4 mg total) by mouth as needed for smoking cessation. Patient not taking: Reported on 09/14/2022 09/06/22   Heilingoetter, Cassandra L, PA-C  prochlorperazine (COMPAZINE) 10 MG tablet Take 1 tablet (10 mg total) by mouth every 6 (six) hours as needed. Patient not taking: Reported on 09/14/2022 09/06/22   Heilingoetter, Cassandra L, PA-C  thiamine (VITAMIN B-1) 100 MG tablet Take 1 tablet (100 mg total) by mouth daily. Patient not taking: Reported on 09/21/2022 09/04/22   [name], MD  rivaroxaban (XARELTO) 10 MG TABS tablet Take 1 tablet (10 mg total) by mouth daily. 05/03/12 06/13/19  Toni Arthurs, MD     Family History  Problem Relation Age of Onset   Hypertension Mother    Diabetes Mother    Heart disease Sister    Heart disease Sister     Social History   Socioeconomic History   Marital status: Widowed    Spouse name: Not on file   Number of children: Not on file   Years of education: Not on file   Highest education level: Not on file  Occupational History   Not on file  Tobacco Use   Smoking status: Every Day    Packs/day: 0.50    Years: 30.00    Additional pack years: 0.00    Total pack years: 15.00    Types: Cigarettes   Smokeless tobacco: Not on file  Vaping Use    Vaping Use: Never used  Substance and Sexual Activity   Alcohol use: Not Currently    Comment: 3x's / month    Drug use: Not Currently    Types: Cocaine, "Crack" cocaine    Comment: reports she quit smoking crack cocaine around 2020   Sexual activity: Not Currently  Other Topics Concern   Not on file  Social History Narrative   Not on file   Social Determinants of Health   Financial Resource Strain: Not on file  Food Insecurity: No Food Insecurity (09/02/2022)   Hunger Vital Sign    Worried About Running Out of Food in the Last Year: Never true    Ran Out of Food in the Last Year: Never true  Transportation Needs: No Transportation Needs (09/02/2022)   PRAPARE - Administrator, Civil Service (Medical): No    Lack of Transportation (Non-Medical): No  Physical Activity: Not on file  Stress: Not on file  Social Connections: Not on file       Review of Systems: denies fever,HA,CP,dyspnea, back pain, or bleeding; does have abd pain,occ nausea, paresthesias right hand/fingers  Vital Signs: Vitals:   09/29/22 1143  Pulse: 68  Resp: 16  SpO2: 98%  Physical Exam: thin BF in NAD; awake/alert; chest- CTA bilat; heart- RRR; abd- soft,+BS,tender epigastric region; no LE edema    Code Status: FULL CODE   Imaging: NM PET Image Initial (PI) Skull Base To Thigh (F-18 FDG)  Result Date: 09/25/2022 CLINICAL DATA:  Initial treatment strategy for non-small cell lung cancer. Undergoing chemotherapy since 09/14/2022. Recent diagnosis of HIV infection. EXAM: NUCLEAR MEDICINE PET SKULL BASE TO THIGH TECHNIQUE: 6.48 mCi F-18 FDG was injected intravenously. Full-ring PET imaging was performed from the skull base to thigh after the radiotracer. CT data was obtained and used for attenuation correction and anatomic localization. Fasting blood glucose: 81 mg/dl COMPARISON:  Chest CT 16/03/9603 FINDINGS: Mediastinal blood pool activity: SUV max 1.6 NECK: Images through the head and  neck are degraded by motion and resulting misregistration between the PET and CT data. No hypermetabolic cervical lymph nodes are identified.Fairly symmetric activity within the lymphoid tissue of Waldeyer's ring is within physiologic limits.No suspicious activity identified within the pharyngeal mucosal space. Incidental CT findings: Bilateral carotid atherosclerosis. CHEST: There are several hypermetabolic mediastinal and hilar lymph nodes. The most intense hypermetabolic activity is within largest right paratracheal nodal mass which currently measures approximately 3.6 x 2.0 cm on image 61/4 and has an SUV max of 11.3. This nodal mass appears slightly smaller than on the recent chest CT. Lesser hypermetabolic activity is seen within right hilar nodes (SUV max 4.9) and subcarinal nodes (SUV max 4.1). No left hilar or axillary adenopathy. The previously demonstrated dominant right lung mass at the confluence of the fissures has decreased in size, now measuring approximately 2.7 x 2.1 cm on image 75/4. This is mildly hypermetabolic with an SUV max of 4.9 and could reflect loculated fluid in the fissure. A nodule in the right upper lobe has decreased in size, now measuring approximately 0.7 cm on image 54/4 (SUV max 5.0). No other significant hypermetabolic activity within the right pleural space. No hypermetabolic pulmonary activity or residual suspicious nodularity in the left lung. Incidental CT findings: Moderate centrilobular and paraseptal emphysema. Small partially loculated right pleural effusion is unchanged in overall volume. There is interval improved aeration of the right upper and middle lobes. Small pericardial effusion has decreased in volume. Atherosclerosis of the aorta, great vessels and coronary arteries noted. ABDOMEN/PELVIS: There is no hypermetabolic activity within the liver, adrenal glands, spleen or pancreas. There is no hypermetabolic nodal activity in the abdomen or pelvis. Incidental CT  findings: Low-density lesions within the right and left hepatic lobe demonstrate no metabolic activity and are likely cysts. Diffuse aortic and branch vessel atherosclerosis. Mildly prominent stool throughout the colon. SKELETON: Fairly homogeneous diffusely increased metabolic activity is demonstrated within the axial and proximal appendicular skeleton. No corresponding focal lytic or blastic lesions are seen, and this is presumably treatment related and/or related to the patient's HIV infection. Incidental CT findings: Moderate facet hypertrophy in the lower lumbar spine: IMPRESSION: 1. The dominant right paratracheal nodal mass has slightly decreased in size compared with the recent chest CT of 09/01/2022, and is intensely hypermetabolic, consistent with malignancy. Additional smaller hypermetabolic right hilar and subcarinal lymph nodes are nonspecific although likely nodal metastases. 2. Hypermetabolic right lung nodules as described, slightly decreased in size from previous chest CT. The largest component along the confluence of the fissures may in part be secondary to loculated pleural fluid. Interval improved aeration of the right upper and middle lobes. 3. No evidence of metastatic disease within the abdomen or pelvis. 4. Diffuse increased metabolic  activity throughout the axial and proximal appendicular skeleton, likely treatment related and/or related to the patient's HIV infection. No focal lytic or blastic osseous lesions identified. 5. Aortic Atherosclerosis (ICD10-I70.0) and Emphysema (ICD10-J43.9). Electronically Signed   By: Carey Bullocks M.D.   On: 09/25/2022 10:10   MR BRAIN W WO CONTRAST  Result Date: 09/19/2022 CLINICAL DATA:  Non-small cell lung cancer (NSCLC), stagingz EXAM: MRI HEAD WITHOUT AND WITH CONTRAST TECHNIQUE: Multiplanar, multiecho pulse sequences of the brain and surrounding structures were obtained without and with intravenous contrast. CONTRAST:  6mL GADAVIST GADOBUTROL 1  MMOL/ML IV SOLN COMPARISON:  None Available. FINDINGS: Brain: Expansile T2/FLAIR hyperintensity within the right frontal cortex, anterior and superior temporal cortex and adjacent frontal white matter with a few areas of associated curvilinear cortical enhancement inferiorly (for example see series 20, image 86). Additionally, there is a punctate focus of enhancement more superiorly in the right frontal cortex (series 20, image 117). Some areas of mild associated patchy DWI hyperintensity with possible mild curvilinear areas of SWI along the frontoparietal cortex central ADC correlate. No evidence of acute mass occupying hemorrhage, midline shift, or hydrocephalus. Vascular: Major arterial flow voids are maintained skull base. Skull and upper cervical spine: Normal marrow signal. Sinuses/Orbits: Inferior left maxillary sinus retention cyst versus polyp. Otherwise, largely clear sinuses. No acute orbital findings. Other: No sizable mastoid effusions. IMPRESSION: Expansile T2/FLAIR hyperintensity in the right frontal cortex with some areas of associated restricted diffusion and curvilinear enhancement. The appearance is most suggestive of evolving subacute enhancing infarct; however, given the expansile appearance a close interval follow-up MRI with contrast in approximately 4-6 weeks is recommended to ensure expected evolution and help exclude glioma and/or metastatic disease. These results will be called to the ordering clinician or representative by the Radiologist Assistant, and communication documented in the PACS or [employer]. Electronically Signed   By: Feliberto Harts M.D.   On: 09/19/2022 11:39   CT Chest Wo Contrast  Result Date: 09/01/2022 CLINICAL DATA:  67 year old female presents with suspected pneumonia. * Tracking Code: BO * EXAM: CT CHEST WITHOUT CONTRAST TECHNIQUE: Multidetector CT imaging of the chest was performed following the standard protocol without IV contrast. RADIATION DOSE  REDUCTION: This exam was performed according to the departmental dose-optimization program which includes automated exposure control, adjustment of the mA and/or kV according to patient size and/or use of iterative reconstruction technique. COMPARISON:  None available. FINDINGS: Cardiovascular: Small pericardial effusion, question pericardial thickening on noncontrast imaging. Heart size is normal. Three-vessel coronary artery disease. Calcified aortic atherosclerosis without aneurysmal dilation. Central pulmonary vasculature is normal caliber though obscured by RIGHT hilar mass. Limited assessment of vascular structures and cardiac structures due to lack of intravenous contrast. Mediastinum/Nodes: Bulky RIGHT paratracheal nodal mass measuring 3.6 x 4.6 cm (image 55/3) This is nearly confluent with additional nodal tissue that measures up to 2.8 cm on image 46/3. Image 71/3 subcarinal adenopathy measuring 1.7 cm. Bulky RIGHT hilar adenopathy measuring up to 2.2 cm greatest thickness. This is confluent with airspace disease in the RIGHT chest. Scattered bilateral axillary lymph nodes mainly less than a cm, those above a cm show fatty hila. Lungs/Pleura: 35/4, 1.6 x 1.5 cm RIGHT upper lobe nodule. Image 81/4 4.6 x 4.8 cm RIGHT middle lobe airspace disease with infiltrative appearance and surrounding diffuse septal thickening. Distortion and nodularity of the minor fissure and loculated appearing area in the major fissure on image 85/4 showing lower density and measuring 4.8 x 3.8 cm. Other loculated areas of  pleural fluid in the RIGHT chest are also noted with small to moderate volume overall with loculated sub pulmonic component as the largest area of discrete pleural fluid in the RIGHT chest. Subtle indistinct areas of nodularity in the LEFT lower lobe 6 x 5 mm (image 74/4) airways are patent. Upper Abdomen: Low-density lesion in the LEFT hepatic lobe measures 9 mm. Area shows low-density, perhaps water density  though image noise makes assessment difficult. Adrenal glands are incompletely assessed, no gross mass. No signs of upper abdominal adenopathy. Musculoskeletal: No acute musculoskeletal process. Spinal degenerative changes. Osteopenia. Lytic area in the T2 vertebral body measuring 4-5 mm. Otherwise no destructive bone findings are noted IMPRESSION: 1. Findings suspicious for bronchogenic neoplasm in the RIGHT chest associated with multifocal pulmonary involvement and bulky mediastinal adenopathy including LEFT paratracheal adenopathy. 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 2. Loculated pleural fluid likely explained by presence of neoplasm with malignant effusion. Would also correlate with any signs of infection. 3. Irregular pericardial thickening is suggested as well. 4. Top-normal bilateral axillary nodal tissue most of which retains fatty hila. These are nonspecific. Given extensive disease in the chest the patient may benefit from PET imaging for further staging when the patient is able. 5. Small pericardial effusion, question pericardial thickening on noncontrast imaging. 6. Subtle indistinct areas of nodularity in the LEFT lower lobe measuring 6 x 5 mm. Could indicate developing multifocal neoplasm also involving the LEFT chest. 7. Low-density hepatic lesion not well evaluated. Further evaluation with dedicated abdominal imaging or PET when the patient is able is suggested. 8. Lytic lesion at T2 could reflect early bony metastatic disease. 9. Three-vessel coronary artery disease and aortic atherosclerosis. Aortic Atherosclerosis (ICD10-I70.0). Electronically Signed   By: Donzetta Kohut M.D.   On: 09/01/2022 13:58   DG Chest 2 View  Result Date: 09/01/2022 CLINICAL DATA:  Chronic cough.  History of smoking EXAM: CHEST - 2 VIEW COMPARISON:  02/18/2011 FINDINGS: Heart size within normal limits. Mass-like opacity in the right mid lung field measuring approximately 6.5 x 4.5 cm. Enlargement  of the right hilum and right paratracheal stripe suggesting of underlying adenopathy. Streaky interstitial opacities throughout the right mid to lower lung field. Small to moderate right-sided pleural effusion. Left lung is clear. IMPRESSION: Mass-like opacity in the right mid lung field measuring approximately 6.5 x 4.5 cm. Enlargement of the right hilum and right paratracheal stripe suggesting underlying adenopathy. Findings are most concerning for malignancy. Chest CT with IV contrast is recommended for further evaluation. These results will be called to the ordering clinician or representative by the Radiologist Assistant, and communication documented in the PACS or [employer]. Electronically Signed   By: Duanne Guess D.O.   On: 09/01/2022 12:13    Labs:  CBC: Recent Labs    09/03/22 0208 09/14/22 0820 09/19/22 1101 09/21/22 0942  WBC 8.2 2.6* 3.1* 14.3*  HGB 9.0* 9.4* 9.9* 9.5*  HCT 28.1* 29.4* 30.3* 29.2*  PLT 320 413* 342 281    COAGS: No results for input(s): "INR", "APTT" in the last 8760 hours.  BMP: Recent Labs    09/02/22 0350 09/03/22 0208 09/14/22 0820 09/19/22 1101 09/21/22 0942  NA 134* 133* 135 133* 137  K 3.7 4.5 3.3* 3.7 3.7  CL 105 108 107 103 107  CO2 20* 20* 24 23 24   GLUCOSE 102* 134* 73 101* 55*  BUN 22 20 10 18 11   CALCIUM 8.7* 8.7* 9.2 9.5 9.3  CREATININE 0.88 0.66 0.63 0.91 0.53  GFRNONAA >60 >60 >60 >60  --     LIVER FUNCTION TESTS: Recent Labs    09/02/22 0942 09/03/22 0208 09/14/22 0820 09/19/22 1101 09/21/22 0942  BILITOT 0.7 0.4 0.2* 0.3 0.2  AST 24 34 14* 17 15  ALT ALKPHOS 69 53 51 46  --   PROT 8.6* 8.2* 9.2* 8.6* 8.8*  ALBUMIN 1.8* 1.6* 3.0* 3.2*  --     TUMOR MARKERS: No results for input(s): "AFPTM", "CEA", "CA199", "CHROMGRNA" in the last 8760 hours.  Assessment and Plan:  67 y/o F with history of small cell lung cancer currently undergoing systemic therapy who presents today for port  placement for durable venous access.  Risks and benefits of image-guided Port-a-catheter placement were discussed with the patient including, but not limited to bleeding, infection, pneumothorax, or fibrin sheath development and need for additional procedures.  All of the patient's questions were answered, patient is agreeable to proceed.  Consent signed and in chart. Pt last ate at 0800 this am and also had recent liquids, therefore single agent only sedation.  Thank you for this interesting consult.  I greatly enjoyed meeting [name] and look forward to participating in their care.  A copy of this report was sent to the requesting provider on this date.  Electronically Signed: Villa Herb, PA-C/Kevin Cyncere Sontag,PA-C 09/27/2022, 9:53 AM   I spent a total of 30 Minutes  in face to face in clinical consultation, greater than 50% of which was counseling/coordinating care for poor venous access/port a cath placement

## 2022-09-27 NOTE — Telephone Encounter (Signed)
PC to patient regarding missed appointments today, no answer, left VM - informed patient our scheduling department will contact her to reschedule.  Scheduling message sent. 

## 2022-09-28 ENCOUNTER — Ambulatory Visit: Payer: 59

## 2022-09-28 ENCOUNTER — Ambulatory Visit: Payer: 59 | Admitting: Nurse Practitioner

## 2022-09-28 ENCOUNTER — Other Ambulatory Visit: Payer: Self-pay

## 2022-09-28 ENCOUNTER — Other Ambulatory Visit: Payer: Self-pay | Admitting: Student

## 2022-09-28 DIAGNOSIS — Z21 Asymptomatic human immunodeficiency virus [HIV] infection status: Secondary | ICD-10-CM

## 2022-09-28 DIAGNOSIS — C3490 Malignant neoplasm of unspecified part of unspecified bronchus or lung: Secondary | ICD-10-CM

## 2022-09-28 NOTE — Patient Instructions (Addendum)
It was nice to talk with you today, if you have any questions or concerns please reach out.   We did a dental referral for you today, you can reach them at [phone].   Once you have your phone working, we can set up MyChart for you.

## 2022-09-28 NOTE — Progress Notes (Signed)
HIV Intake   [name]  67 y.o., female    Diagnosis Date: 09/01/22   Into care date 09/21/22:   Initial VL:  5350  Initial CD4: 311    Risk factors: heterosexual contact  Referring agency/circumstances of diagnosis: [name] was admitted to [facility] after abnormal CXR findings. 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   Last (-) HIV test: never tested for HIV prior to this admission   PrEP history: none   Patient Bio:    [name] has been in [place] her whole life. She is not currently working, but has a lot of family in the area like siblings and nieces and nephews. She has decided not to disclose her diagnosis to anyone at this time.   Housing: stable housing, rents, lives alone    Transportation: does not have a car, her niece brought her in today. Provided her with bus passes. This is a significant barrier for her as she has many appointments due to her newly diagnosed cancer. She also expresses frustration that some of her appointment times have been changed without her knowledge. She would benefit from consolidating her appointments and getting labs done on the same day as her follow up.  Food Security: no issues reported, made her aware of our food pantry    Sexual Health    Sex/gender of partners: male  STI history: currently RPR serofast at 1:1 (per DIS patient had titer of 1:128 in 1999 and was treated with BIC x 3)  Tx with metronidazole for trichomonas (06/27/22)  Pap (cervical/anal) history: not sure when her last Pap was, we discussed that NP can do this for her here at the office. It would be best to coordinate this on a day when she already has an appointment at the clinic due to transportation barriers. Will hold off on Pap while she gets started with chemotherapy.   Number of children and pregnancy history: no children   Current relationship status: not currently sexually active, last partner was a year ago      Substance Use    Tobacco/alcohol/substances: smokes 0.5 PPD, she is working on quitting. Not currently drinking any alcohol. No history of drug use or injection drugs.    Interest in treatment: n/a     Mental Health   Current symptoms: Feelings of overwhelm due to new medical conditions and number of appointments.   Interest in treatment/counseling: Not interested in counseling at this time, asked her to reach out if she would like to see a counselor in the future.     Primary Care     Other medical conditions: Patient Active Problem List   Diagnosis Date Noted   History of syphilis 09/26/2022   Routine screening for STI (sexually transmitted infection) 09/21/2022   HIV test positive 09/06/2022   Small cell lung cancer 09/06/2022   Goals of care, counseling/discussion 09/06/2022   HIV (human immunodeficiency virus infection) 09/03/2022   Chronic respiratory failure with hypoxia 09/03/2022   Lung mass 09/01/2022   Sepsis 09/01/2022   Alcohol withdrawal 09/01/2022   Preop cardiovascular exam 04/26/2012   Chest pain 04/26/2012   Tachycardia 04/26/2012   Smoking 04/26/2012    Medications:  Current Outpatient Medications on File Prior to Visit  Medication Sig Dispense Refill   acetaminophen (TYLENOL) 500 MG tablet Take 1,000 mg by mouth every 4 (four) hours as needed. For pain     diphenoxylate-atropine (LOMOTIL) 2.5-0.025 MG tablet Take 1 tablet  by mouth 4 (four) times daily as needed for diarrhea or loose stools. (Patient not taking: Reported on 09/21/2022) 30 tablet 0   dolutegravir-lamiVUDine (DOVATO) 50-300 MG tablet Take 1 tablet by mouth daily. 30 tablet 11   folic acid (FOLVITE) 1 MG tablet Take 1 tablet (1 mg total) by mouth daily. 90 tablet 0   guaifenesin (ROBITUSSIN) 100 MG/5ML syrup Take 200 mg by mouth 3 (three) times daily as needed for cough. (Patient not taking: Reported on 09/21/2022)     lidocaine-prilocaine (EMLA) cream Apply 1 Application  topically as needed. 30 g 2   metroNIDAZOLE (FLAGYL) 500 MG tablet Take 1 tablet (500 mg total) by mouth 2 (two) times daily for 7 days. Please Mail to the patient 14 tablet 0   nicotine (NICODERM CQ) 7 mg/24hr patch Place 1 patch (7 mg total) onto the skin daily. (Patient not taking: Reported on 09/21/2022) 28 patch 0   nicotine polacrilex (NICORETTE) 4 MG gum Take 1 each (4 mg total) by mouth as needed for smoking cessation. (Patient not taking: Reported on 09/14/2022) 30 tablet 2   prochlorperazine (COMPAZINE) 10 MG tablet Take 1 tablet (10 mg total) by mouth every 6 (six) hours as needed. (Patient not taking: Reported on 09/14/2022) 30 tablet 2   thiamine (VITAMIN B-1) 100 MG tablet Take 1 tablet (100 mg total) by mouth daily. (Patient not taking: Reported on 09/21/2022) 90 tablet 0   [DISCONTINUED] rivaroxaban (XARELTO) 10 MG TABS tablet Take 1 tablet (10 mg total) by mouth daily. 14 tablet 0   No current facility-administered medications on file prior to visit.    PCP:   Reports she has a PCP appointment already set up.   Dental   Dental concerns: No current concerns, but would like to see a dentist for routine care. Dental referral completed.     Medication   Barriers to adherence: none mentioned    Medication tolerance: no side effects reported     Labs   HIV  HIV 1&2 Ab, 4th Generation  Date Value Ref Range Status  09/21/2022 REPEATEDLY REACTIVE (A) NON-REACTIVE Final    Comment:    The repeatedly reactive screening assay result is confirmed by duplicate repeat testing, and indicates a POSSIBLE presence of HIV-1 antibodies or HIV-2  antibodies, and/or HIV-1 p24 antigen. Additional testing is required for diagnosis.  . Therefore, these screening results must be correlated  with results of reflex confirmatory tests, including the HIV-1/HIV-2 antibody differentiation assay and, if necessary, HIV-1 RNA, Qualitative Real-Time PCR. Marland Kitchen The 4th generation HIV-1/2  Antigen/Antibody combination immunoassay is a screening test and should not be used alone for diagnosis. Repeatedly reactive results from the 4th generation screening test are only indicative of HIV infection when those screening results are confirmed to be positive by either the HIV-1/2 Antibody Differentiation Assay or the HIV-1 RNA, Qualitative Real-Time PCR test. . PLEASE NOTE: This information has been disclosed to you from records whose confidentiality may be protected by state law. If your state requires such protection, then the state law prohibits you from making any further disclosure of the information without the specific written consent of the person to whom it pertains, or as otherwise permitted by law. A general authorization for the release of medical or other information is NOT sufficient for this purpose. . The performance of this assay has not been clinically validated in patients less than 98 years old. Marland Kitchen    HIV 1 RNA Quant  Date Value Ref Range Status  09/21/2022 3,090 (H) Copies/mL Final  09/02/2022 5,350 copies/mL Corrected    Comment:    (NOTE) The reportable range for this assay is 20 to 10,000,000 copies HIV-1 RNA/mL.      Hepatitis B  Lab Results  Component Value Date   HEPBSAB REACTIVE (A) 09/21/2022   HEPBSAG NON-REACTIVE 09/21/2022   HEPBCAB REACTIVE (A) 09/21/2022    Hepatitis C  Lab Results  Component Value Date   HEPCAB NON-REACTIVE 09/21/2022    Hepatitis A  Lab Results  Component Value Date   HAV REACTIVE (A) 09/21/2022    RPR and STI  Lab Results  Component Value Date   LABRPR REACTIVE (A) 09/21/2022   RPRTITER 1:1 (H) 09/21/2022    STI Results GC CT  09/21/2022  9:22 AM Negative  Negative      Additional Comments & Interventions    Discussed effect of ART on VL and CD4, discussed U=U, natural progression of disease and risk for other STIs.   Discussed recommended vaccines for persons living with HIV.    Referrals made: Dental  Sandie Ano, RN

## 2022-09-29 ENCOUNTER — Other Ambulatory Visit: Payer: Self-pay

## 2022-09-29 ENCOUNTER — Ambulatory Visit (HOSPITAL_COMMUNITY)
Admission: RE | Admit: 2022-09-29 | Discharge: 2022-09-29 | Disposition: A | Discharge status: Home / Self Care | Payer: 59 | Source: Ambulatory Visit | Attending: Physician Assistant | Admitting: Physician Assistant

## 2022-09-29 ENCOUNTER — Encounter (HOSPITAL_COMMUNITY): Payer: Self-pay

## 2022-09-29 DIAGNOSIS — Z8619 Personal history of other infectious and parasitic diseases: Secondary | ICD-10-CM | POA: Insufficient documentation

## 2022-09-29 DIAGNOSIS — Z79899 Other long term (current) drug therapy: Secondary | ICD-10-CM | POA: Insufficient documentation

## 2022-09-29 DIAGNOSIS — Z21 Asymptomatic human immunodeficiency virus [HIV] infection status: Secondary | ICD-10-CM | POA: Diagnosis not present

## 2022-09-29 DIAGNOSIS — F17210 Nicotine dependence, cigarettes, uncomplicated: Secondary | ICD-10-CM | POA: Diagnosis not present

## 2022-09-29 DIAGNOSIS — C3490 Malignant neoplasm of unspecified part of unspecified bronchus or lung: Secondary | ICD-10-CM | POA: Diagnosis present

## 2022-09-29 HISTORY — PX: IR IMAGING GUIDED PORT INSERTION: IMG5740

## 2022-09-29 MED ORDER — FENTANYL CITRATE (PF) 100 MCG/2ML IJ SOLN
INTRAMUSCULAR | Status: AC
  Filled 2022-09-29: qty 2

## 2022-09-29 MED ORDER — LIDOCAINE-EPINEPHRINE 1 %-1:100000 IJ SOLN
INTRAMUSCULAR | Status: AC
  Filled 2022-09-29: qty 1

## 2022-09-29 MED ORDER — HEPARIN SOD (PORK) LOCK FLUSH 100 UNIT/ML IV SOLN
INTRAVENOUS | Status: AC | PRN
  Administered 2022-09-29: 500 [IU] via INTRAVENOUS

## 2022-09-29 MED ORDER — LIDOCAINE-EPINEPHRINE 1 %-1:100000 IJ SOLN
20.0000 mL | Freq: Once | INTRAMUSCULAR | Status: AC
  Administered 2022-09-29: 20 mL via INTRADERMAL

## 2022-09-29 MED ORDER — FENTANYL CITRATE (PF) 100 MCG/2ML IJ SOLN
INTRAMUSCULAR | Status: AC | PRN
  Administered 2022-09-29 (×2): 25 ug via INTRAVENOUS
  Administered 2022-09-29: 50 ug via INTRAVENOUS

## 2022-09-29 MED ORDER — SODIUM CHLORIDE 0.9 % IV SOLN: INTRAVENOUS | Status: DC

## 2022-09-29 MED ORDER — HEPARIN SOD (PORK) LOCK FLUSH 100 UNIT/ML IV SOLN
INTRAVENOUS | Status: AC
  Filled 2022-09-29: qty 5

## 2022-09-29 NOTE — Discharge Instructions (Addendum)
For questions /concerns may call Interventional Radiology at [phone] or  Interventional Radiology clinic [phone]   You may remove your dressing and shower tomorrow afternoon  DO NOT use EMLA cream for 2 weeks after port placement as the cream will remove surgical glue on your incision.    Implanted Port Insertion, Care After This sheet gives you information about how to care for yourself after your procedure. Your health care provider may also give you more specific instructions. If you have problems or questions, contact your health careprovider. What can I expect after the procedure? After the procedure, it is common to have: Discomfort at the port insertion site. Bruising on the skin over the port. This should improve over 3-4 days. Follow these instructions at home: [facility] care After your port is placed, you will get a manufacturer's information card. The card has information about your port. Keep this card with you at all times. Take care of the port as told by your health care provider. Ask your health care provider if you or a family member can get training for taking care of the port at home. A home health care nurse may also take care of the port. Make sure to remember what type of port you have. Incision care Follow instructions from your health care provider about how to take care of your port insertion site. Make sure you: Wash your hands with soap and water before and after you change your bandage (dressing). If soap and water are not available, use hand sanitizer. Change your dressing as told by your health care provider. Leave skin glue, or adhesive strips in place. These skin closures may need to stay in place for 2 weeks or longer.  Check your port insertion site every day for signs of infection. Check for:      - Redness, swelling, or pain.                     - Fluid or blood.      - Warmth.      - Pus or a bad smell. Activity Return to your normal activities  as told by your health care provider. Ask your health care provider what activities are safe for you. Do not lift anything that is heavier than 10 lb (4.5 kg), or the limit that you are told, until your health care provider says that it is safe. General instructions Take over-the-counter and prescription medicines only as told by your health care provider. Do not take baths, swim, or use a hot tub until your health care provider approves. Ask your health care provider if you may take showers. You may only be allowed to take sponge baths. Do not drive for 24 hours if you were given a sedative during your procedure. Wear a medical alert bracelet in case of an emergency. This will tell any health care providers that you have a port. Keep all follow-up visits as told by your health care provider. This is important. Contact a health care provider if: You cannot flush your port with saline as directed, or you cannot draw blood from the port. You have a fever or chills. You have redness, swelling, or pain around your port insertion site. You have fluid or blood coming from your port insertion site. Your port insertion site feels warm to the touch. You have pus or a bad smell coming from the port insertion site. Get help right away if: You have chest pain or shortness of  breath. You have bleeding from your port that you cannot control. Summary Take care of the port as told by your health care provider. Keep the manufacturer's information card with you at all times. Change your dressing as told by your health care provider. Contact a health care provider if you have a fever or chills or if you have redness, swelling, or pain around your port insertion site. Keep all follow-up visits as told by your health care provider. This information is not intended to replace advice given to you by your health care provider. Make sure you discuss any questions you have with your healthcare provider. Document  Revised: 12/26/2017 Document Reviewed: 12/26/2017

## 2022-09-29 NOTE — Procedures (Signed)
Vascular and Interventional Radiology Procedure Note  Patient: [name] DOB: [birth year] Medical Record Number: [id number] Note Date/Time: 09/29/22 1:45 PM   Performing Physician: Roanna Banning, MD Assistant(s): None  Diagnosis: Lung cancer  Procedure: PORT PLACEMENT  Anesthesia: Conscious Sedation Complications: None Estimated Blood Loss: Minimal  Findings:  Successful LEFT-sided port placement, with the tip of the catheter in the proximal right atrium.  Plan: Catheter ready for use.  See detailed procedure note with images in PACS. The patient tolerated the procedure well without incident or complication and was returned to Recovery in stable condition.    Roanna Banning, MD [facility]   Pager. [phone] Clinic. [phone]

## 2022-10-01 NOTE — Progress Notes (Unsigned)
[name] OFFICE PROGRESS NOTE  Joan Pho, MD [address] [place] [address]  DIAGNOSIS:  1) Extensive stage small cell lung cancer (T3, N3, M1 C) . She presented with large right lower lobe lung masses in addition to bulky right hilar and mediastinal lymphadenopathy as well as suspicious lytic bone lesion at L1. She was diagnosed in March 2024.  2) HIV diagnosed in March 2024  PRIOR THERAPY: None  CURRENT THERAPY:  Systemic chemotherapy with carboplatin for AUC of 4 on day 1 and etoposide 80 Mg/M2 on days 1, 2 and 3 with Imfinzi 1500 Mg on day 1 every 3 weeks with Neulasta support on day 5. First dose September 14, 2022 . She is on dose reduced chemotherapy due to her HIV. Status post 1 cycle.   INTERVAL HISTORY: [name] 67 y.o. female returns to the clinic today for a follow-up visit.  The patient was recently diagnosed with extensive stage small cell lung cancer.  She underwent her first cycle of chemotherapy and tolerated it well except for fatigue. When she was last seen, she had been endorsing diarrhea, which started even prior to her chemotherapy. Her C. Diff and GI pathogen panel was negative. She denies excess intake of protein drinks. She denies new medications prior to onset of diarrhea. She takes imodium. She never picked up the prescription for lomotil, compazine, or nicoderm.  She reports she has transportation constraints and she is part of the transportation program.  We will call over to the [facility] pharmacy to see how to facilitate mailing her prescriptions.  Fortunately, her diarrhea has since resolved without the use of Imodium.  When she was last seen, her brain MRI showed abnormality suggestive of subacute infarct.  She was scheduled with Dr. Barbaraann Cao on 4/16.  The patient has several appointments and has missed appointments.  She is having trouble logging into her MyChart.  Otherwise the patient does not have any complaints today.  She denies  any fever, chills, night sweats, or unexplained weight loss.  She denies any shortness of breath unless she is out in public for several hours and does not bring her oxygen tank.  The patient's oxygen is surfaced by [employer].  She states that she has called them due to issues with her current oxygen tank being too noisy and "old".  She denies any cough or hemoptysis.  Denies any nausea, vomiting, or constipation.  Denies any headache or visual changes.  She does have some dry skin on her face without any rashes.  She is here today for evaluation repeat blood work before undergoing cycle #2.   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  MEDICATIONS:  Current Outpatient Medications  Medication Sig Dispense Refill   acetaminophen (TYLENOL) 500 MG tablet Take 1,000 mg by mouth every 4 (four) hours as needed. For pain     diphenoxylate-atropine (LOMOTIL) 2.5-0.025 MG tablet Take 1 tablet by mouth 4 (four) times daily as needed for diarrhea or loose stools. (Patient not taking: Reported on 09/21/2022) 30 tablet 0   dolutegravir-lamiVUDine (DOVATO) 50-300 MG tablet Take 1 tablet by mouth daily. 30 tablet 11   folic acid (FOLVITE) 1 MG tablet  Take 1 tablet (1 mg total) by mouth daily. 90 tablet 0   guaifenesin (ROBITUSSIN) 100 MG/5ML syrup Take 200 mg by mouth 3 (three) times daily as needed for cough. (Patient not taking: Reported on 09/21/2022)     lidocaine-prilocaine (EMLA) cream Apply 1 Application topically as needed. 30 g 2   metroNIDAZOLE (FLAGYL) 500 MG tablet Take 1 tablet (500 mg total) by mouth 2 (two) times daily for 7 days. Please Mail to the patient 14 tablet 0   nicotine (NICODERM CQ) 7 mg/24hr patch Place 1 patch (7 mg total) onto the skin  daily. (Patient not taking: Reported on 09/21/2022) 28 patch 0   nicotine polacrilex (NICORETTE) 4 MG gum Take 1 each (4 mg total) by mouth as needed for smoking cessation. (Patient not taking: Reported on 09/14/2022) 30 tablet 2   prochlorperazine (COMPAZINE) 10 MG tablet Take 1 tablet (10 mg total) by mouth every 6 (six) hours as needed. (Patient not taking: Reported on 09/14/2022) 30 tablet 2   thiamine (VITAMIN B-1) 100 MG tablet Take 1 tablet (100 mg total) by mouth daily. (Patient not taking: Reported on 09/21/2022) 90 tablet 0   No current facility-administered medications for this visit.    SURGICAL HISTORY:  Past Surgical History:  Procedure Laterality Date   BRONCHIAL NEEDLE ASPIRATION BIOPSY  09/02/2022   Procedure: BRONCHIAL NEEDLE ASPIRATION BIOPSIES;  Surgeon: Josephine Igo, DO;  Location: [facility];  Service: Cardiopulmonary;;   IR IMAGING GUIDED PORT INSERTION  09/29/2022   MULTIPLE TOOTH EXTRACTIONS     ORIF ANKLE FRACTURE  05/03/2012   Procedure: OPEN REDUCTION INTERNAL FIXATION (ORIF) ANKLE FRACTURE;  Surgeon: Toni Arthurs, MD;  Location: [facility] OR;  Service: Orthopedics;  Laterality: Left;   VIDEO BRONCHOSCOPY WITH ENDOBRONCHIAL ULTRASOUND Bilateral 09/02/2022   Procedure: VIDEO BRONCHOSCOPY WITH ENDOBRONCHIAL ULTRASOUND;  Surgeon: Josephine Igo, DO;  Location: [facility];  Service: Cardiopulmonary;  Laterality: Bilateral;    REVIEW OF SYSTEMS:   Review of Systems  Constitutional: Negative for appetite change, chills, fatigue, fever and unexpected weight change.  HENT:   Negative for mouth sores, nosebleeds, sore throat and trouble swallowing.   Eyes: Negative for eye problems and icterus.  Respiratory: Negative for cough, hemoptysis, shortness of breath and wheezing.   Cardiovascular: Negative for chest pain and leg swelling.  Gastrointestinal: Negative for abdominal pain, constipation, diarrhea, nausea and vomiting.  Genitourinary: Negative for bladder incontinence,  difficulty urinating, dysuria, frequency and hematuria.   Musculoskeletal: Negative for back pain, gait problem, neck pain and neck stiffness.  Skin: Positive for dry skin on face.  Negative for rash. Neurological: Negative for dizziness, extremity weakness, gait problem, headaches, light-headedness and seizures.  Hematological: Negative for adenopathy. Does not bruise/bleed easily.  Psychiatric/Behavioral: Negative for confusion, depression and sleep disturbance. The patient is not nervous/anxious.     PHYSICAL EXAMINATION:  There were no vitals taken for this visit.  ECOG PERFORMANCE STATUS: 1  Physical Exam  Constitutional: Oriented to person, place, and time and thin appearing female and in no distress.   HENT:  Head: Normocephalic and atraumatic.  Mouth/Throat: Oropharynx is clear and moist. No oropharyngeal exudate.  Eyes: Conjunctivae are normal. Right eye exhibits no discharge. Left eye exhibits no discharge. No scleral icterus.  Neck: Normal range of motion. Neck supple.  Cardiovascular: Normal rate, regular rhythm, normal heart sounds and intact distal pulses.   Pulmonary/Chest: Effort normal and breath sounds normal. No respiratory distress. No wheezes. No rales.  Abdominal: Soft. Bowel sounds are  normal. Exhibits no distension and no mass. There is no tenderness.  Musculoskeletal: Normal range of motion. Exhibits no edema.  Lymphadenopathy:    No cervical adenopathy.  Neurological: Alert and oriented to person, place, and time. Exhibits muscle wasting. Gait normal. Coordination normal.  Skin: Skin is warm and dry. No rash noted. Not diaphoretic. No erythema. No pallor.  Psychiatric: Mood, memory and judgment normal.  Vitals reviewed.  LABORATORY DATA: Lab Results  Component Value Date   WBC 14.3 (H) 09/21/2022   HGB 9.5 (L) 09/21/2022   HCT 29.2 (L) 09/21/2022   MCV 91.3 09/21/2022   PLT 281 09/21/2022      Chemistry      Component Value Date/Time   NA 137  09/21/2022 0942   K 3.7 09/21/2022 0942   CL 107 09/21/2022 0942   CO2 24 09/21/2022 0942   BUN 11 09/21/2022 0942   CREATININE 0.53 09/21/2022 0942      Component Value Date/Time   CALCIUM 9.3 09/21/2022 0942   ALKPHOS 46 09/19/2022 1101   AST 15 09/21/2022 0942   AST 17 09/19/2022 1101   ALT 13 09/21/2022 0942   ALT 12 09/19/2022 1101   BILITOT 0.2 09/21/2022 0942   BILITOT 0.3 09/19/2022 1101       RADIOGRAPHIC STUDIES:  IR IMAGING GUIDED PORT INSERTION  Result Date: 09/29/2022 INDICATION: RIGHT lung mass. EXAM: IMPLANTED PORT A CATH PLACEMENT WITH ULTRASOUND AND FLUOROSCOPIC GUIDANCE MEDICATIONS: None ANESTHESIA/SEDATION: Local anesthetic and single agent sedation was employed during this procedure. A total of fentanyl 100 mcg was administered intravenously. The patient's level of consciousness and vital signs were monitored continuously by radiology nursing throughout the procedure under my direct supervision. FLUOROSCOPY TIME:  Fluoroscopic dose; 0 mGy COMPLICATIONS: None immediate. PROCEDURE: The procedure, risks, benefits, and alternatives were explained to the patient. Questions regarding the procedure were encouraged and answered. The patient understands and consents to the procedure. The LEFT neck and chest were prepped with chlorhexidine in a sterile fashion, and a sterile drape was applied covering the operative field. Maximum barrier sterile technique with sterile gowns and gloves were used for the procedure. A timeout was performed prior to the initiation of the procedure. Local anesthesia was provided with 1% lidocaine with epinephrine. After creating a small venotomy incision, a micropuncture kit was utilized to access the internal jugular vein under direct, real-time ultrasound guidance. Ultrasound image documentation was performed. The microwire was kinked to measure appropriate catheter length. A subcutaneous port pocket was then created along the upper chest wall  utilizing a combination of sharp and blunt dissection. The pocket was irrigated with sterile saline. A single lumen Non-ISP power injectable port was chosen for placement. The 8 Fr catheter was tunneled from the port pocket site to the venotomy incision. The port was placed in the pocket. The external catheter was trimmed to appropriate length. At the venotomy, an 8 Fr peel-away sheath was placed over a guidewire under fluoroscopic guidance. The catheter was then placed through the sheath and the sheath was removed. Final catheter positioning was confirmed and documented with a fluoroscopic spot radiograph. The port was accessed with a Huber needle, aspirated and flushed with heparinized saline. The port pocket incision was closed with interrupted 3-0 Vicryl suture then Dermabond was applied, including at the venotomy incision. Dressings were placed. The patient tolerated the procedure well without immediate post procedural complication. IMPRESSION: Successful placement of a LEFT internal jugular approach power injectable Port-A-Cath. The tip of the catheter is positioned  at the superior cavo-atrial junction. The catheter is ready for immediate use. Roanna Banning, MD [facility] Radiology Electronically Signed   By: Roanna Banning M.D.   On: 09/29/2022 15:46   NM PET Image Initial (PI) Skull Base To Thigh (F-18 FDG)  Result Date: 09/25/2022 CLINICAL DATA:  Initial treatment strategy for non-small cell lung cancer. Undergoing chemotherapy since 09/14/2022. Recent diagnosis of HIV infection. EXAM: NUCLEAR MEDICINE PET SKULL BASE TO THIGH TECHNIQUE: 6.48 mCi F-18 FDG was injected intravenously. Full-ring PET imaging was performed from the skull base to thigh after the radiotracer. CT data was obtained and used for attenuation correction and anatomic localization. Fasting blood glucose: 81 mg/dl COMPARISON:  Chest CT 29/56/2130 FINDINGS: Mediastinal blood pool activity:  SUV max 1.6 NECK: Images through the head and neck are degraded by motion and resulting misregistration between the PET and CT data. No hypermetabolic cervical lymph nodes are identified.Fairly symmetric activity within the lymphoid tissue of Waldeyer's ring is within physiologic limits.No suspicious activity identified within the pharyngeal mucosal space. Incidental CT findings: Bilateral carotid atherosclerosis. CHEST: There are several hypermetabolic mediastinal and hilar lymph nodes. The most intense hypermetabolic activity is within largest right paratracheal nodal mass which currently measures approximately 3.6 x 2.0 cm on image 61/4 and has an SUV max of 11.3. This nodal mass appears slightly smaller than on the recent chest CT. Lesser hypermetabolic activity is seen within right hilar nodes (SUV max 4.9) and subcarinal nodes (SUV max 4.1). No left hilar or axillary adenopathy. The previously demonstrated dominant right lung mass at the confluence of the fissures has decreased in size, now measuring approximately 2.7 x 2.1 cm on image 75/4. This is mildly hypermetabolic with an SUV max of 4.9 and could reflect loculated fluid in the fissure. A nodule in the right upper lobe has decreased in size, now measuring approximately 0.7 cm on image 54/4 (SUV max 5.0). No other significant hypermetabolic activity within the right pleural space. No hypermetabolic pulmonary activity or residual suspicious nodularity in the left lung. Incidental CT findings: Moderate centrilobular and paraseptal emphysema. Small partially loculated right pleural effusion is unchanged in overall volume. There is interval improved aeration of the right upper and middle lobes. Small pericardial effusion has decreased in volume. Atherosclerosis of the aorta, great vessels and coronary arteries noted. ABDOMEN/PELVIS: There is no hypermetabolic activity within the liver, adrenal glands, spleen or pancreas. There is no hypermetabolic nodal  activity in the abdomen or pelvis. Incidental CT findings: Low-density lesions within the right and left hepatic lobe demonstrate no metabolic activity and are likely cysts. Diffuse aortic and branch vessel atherosclerosis. Mildly prominent stool throughout the colon. SKELETON: Fairly homogeneous diffusely increased metabolic activity is demonstrated within the axial and proximal appendicular skeleton. No corresponding focal lytic or blastic lesions are seen, and this is presumably treatment related and/or related to the patient's HIV infection. Incidental CT findings: Moderate facet hypertrophy in the lower lumbar spine: IMPRESSION: 1. The dominant right paratracheal nodal mass has slightly decreased in size compared with the recent chest CT of 09/01/2022, and is intensely hypermetabolic, consistent with malignancy. Additional smaller hypermetabolic right hilar and subcarinal lymph nodes are nonspecific although likely nodal metastases. 2. Hypermetabolic right lung nodules as described, slightly decreased in size from previous chest CT. The largest component along the confluence of the fissures may in part be secondary to loculated pleural fluid. Interval improved aeration of the right upper and middle lobes. 3. No evidence of metastatic disease  within the abdomen or pelvis. 4. Diffuse increased metabolic activity throughout the axial and proximal appendicular skeleton, likely treatment related and/or related to the patient's HIV infection. No focal lytic or blastic osseous lesions identified. 5. Aortic Atherosclerosis (ICD10-I70.0) and Emphysema (ICD10-J43.9). Electronically Signed   By: Carey Bullocks M.D.   On: 09/25/2022 10:10   MR BRAIN W WO CONTRAST  Result Date: 09/19/2022 CLINICAL DATA:  Non-small cell lung cancer (NSCLC), stagingz EXAM: MRI HEAD WITHOUT AND WITH CONTRAST TECHNIQUE: Multiplanar, multiecho pulse sequences of the brain and surrounding structures were obtained without and with intravenous  contrast. CONTRAST:  6mL GADAVIST GADOBUTROL 1 MMOL/ML IV SOLN COMPARISON:  None Available. FINDINGS: Brain: Expansile T2/FLAIR hyperintensity within the right frontal cortex, anterior and superior temporal cortex and adjacent frontal white matter with a few areas of associated curvilinear cortical enhancement inferiorly (for example see series 20, image 86). Additionally, there is a punctate focus of enhancement more superiorly in the right frontal cortex (series 20, image 117). Some areas of mild associated patchy DWI hyperintensity with possible mild curvilinear areas of SWI along the frontoparietal cortex central ADC correlate. No evidence of acute mass occupying hemorrhage, midline shift, or hydrocephalus. Vascular: Major arterial flow voids are maintained skull base. Skull and upper cervical spine: Normal marrow signal. Sinuses/Orbits: Inferior left maxillary sinus retention cyst versus polyp. Otherwise, largely clear sinuses. No acute orbital findings. Other: No sizable mastoid effusions. IMPRESSION: Expansile T2/FLAIR hyperintensity in the right frontal cortex with some areas of associated restricted diffusion and curvilinear enhancement. The appearance is most suggestive of evolving subacute enhancing infarct; however, given the expansile appearance a close interval follow-up MRI with contrast in approximately 4-6 weeks is recommended to ensure expected evolution and help exclude glioma and/or metastatic disease. These results will be called to the ordering clinician or representative by the Radiologist Assistant, and communication documented in the PACS or [employer]. Electronically Signed   By: Feliberto Harts M.D.   On: 09/19/2022 11:39   CT Chest Wo Contrast  Result Date: 09/01/2022 CLINICAL DATA:  67 year old female presents with suspected pneumonia. * Tracking Code: BO * EXAM: CT CHEST WITHOUT CONTRAST TECHNIQUE: Multidetector CT imaging of the chest was performed following the standard  protocol without IV contrast. RADIATION DOSE REDUCTION: This exam was performed according to the departmental dose-optimization program which includes automated exposure control, adjustment of the mA and/or kV according to patient size and/or use of iterative reconstruction technique. COMPARISON:  None available. FINDINGS: Cardiovascular: Small pericardial effusion, question pericardial thickening on noncontrast imaging. Heart size is normal. Three-vessel coronary artery disease. Calcified aortic atherosclerosis without aneurysmal dilation. Central pulmonary vasculature is normal caliber though obscured by RIGHT hilar mass. Limited assessment of vascular structures and cardiac structures due to lack of intravenous contrast. Mediastinum/Nodes: Bulky RIGHT paratracheal nodal mass measuring 3.6 x 4.6 cm (image 55/3) This is nearly confluent with additional nodal tissue that measures up to 2.8 cm on image 46/3. Image 71/3 subcarinal adenopathy measuring 1.7 cm. Bulky RIGHT hilar adenopathy measuring up to 2.2 cm greatest thickness. This is confluent with airspace disease in the RIGHT chest. Scattered bilateral axillary lymph nodes mainly less than a cm, those above a cm show fatty hila. Lungs/Pleura: 35/4, 1.6 x 1.5 cm RIGHT upper lobe nodule. Image 81/4 4.6 x 4.8 cm RIGHT middle lobe airspace disease with infiltrative appearance and surrounding diffuse septal thickening. Distortion and nodularity of the minor fissure and loculated appearing area in the major fissure on image 85/4 showing lower density and  measuring 4.8 x 3.8 cm. Other loculated areas of pleural fluid in the RIGHT chest are also noted with small to moderate volume overall with loculated sub pulmonic component as the largest area of discrete pleural fluid in the RIGHT chest. Subtle indistinct areas of nodularity in the LEFT lower lobe 6 x 5 mm (image 74/4) airways are patent. Upper Abdomen: Low-density lesion in the LEFT hepatic lobe measures 9 mm. Area  shows low-density, perhaps water density though image noise makes assessment difficult. Adrenal glands are incompletely assessed, no gross mass. No signs of upper abdominal adenopathy. Musculoskeletal: No acute musculoskeletal process. Spinal degenerative changes. Osteopenia. Lytic area in the T2 vertebral body measuring 4-5 mm. Otherwise no destructive bone findings are noted IMPRESSION: 1. Findings suspicious for bronchogenic neoplasm in the RIGHT chest associated with multifocal pulmonary involvement and bulky mediastinal adenopathy including LEFT paratracheal adenopathy. 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 2. Loculated pleural fluid likely explained by presence of neoplasm with malignant effusion. Would also correlate with any signs of infection. 3. Irregular pericardial thickening is suggested as well. 4. Top-normal bilateral axillary nodal tissue most of which retains fatty hila. These are nonspecific. Given extensive disease in the chest the patient may benefit from PET imaging for further staging when the patient is able. 5. Small pericardial effusion, question pericardial thickening on noncontrast imaging. 6. Subtle indistinct areas of nodularity in the LEFT lower lobe measuring 6 x 5 mm. Could indicate developing multifocal neoplasm also involving the LEFT chest. 7. Low-density hepatic lesion not well evaluated. Further evaluation with dedicated abdominal imaging or PET when the patient is able is suggested. 8. Lytic lesion at T2 could reflect early bony metastatic disease. 9. Three-vessel coronary artery disease and aortic atherosclerosis. Aortic Atherosclerosis (ICD10-I70.0). Electronically Signed   By: Donzetta Kohut M.D.   On: 09/01/2022 13:58   DG Chest 2 View  Result Date: 09/01/2022 CLINICAL DATA:  Chronic cough.  History of smoking EXAM: CHEST - 2 VIEW COMPARISON:  02/18/2011 FINDINGS: Heart size within normal limits. Mass-like opacity in the right mid lung field  measuring approximately 6.5 x 4.5 cm. Enlargement of the right hilum and right paratracheal stripe suggesting of underlying adenopathy. Streaky interstitial opacities throughout the right mid to lower lung field. Small to moderate right-sided pleural effusion. Left lung is clear. IMPRESSION: Mass-like opacity in the right mid lung field measuring approximately 6.5 x 4.5 cm. Enlargement of the right hilum and right paratracheal stripe suggesting underlying adenopathy. Findings are most concerning for malignancy. Chest CT with IV contrast is recommended for further evaluation. These results will be called to the ordering clinician or representative by the Radiologist Assistant, and communication documented in the PACS or [employer]. Electronically Signed   By: Duanne Guess D.O.   On: 09/01/2022 12:13     ASSESSMENT/PLAN:  This is a very pleasant 67 year old female with extensive stage small cell lung cancer (T3, N3, M1 C) . She presented with large right lower lobe lung masses in addition to bulky right hilar and mediastinal lymphadenopathy as well as suspicious lytic bone lesion at L1. She was diagnosed in March 2024.    She is currently on systemic chemotherapy with carboplatin for AUC of 4 on day 1 and etoposide 80 Mg/M2 on days 1, 2 and 3 with Neulasta support on day 5 as well as Imfinzi 1500 Mg IV on day 1 every 3 weeks.  With starting with the reduced dose of carboplatin and etoposide side because of her HIV status.  She is status post 1 cycle and tolerated it well.   Labs were reviewed. Recommend proceed she with cycle #2 today as scheduled.   Since her Pet scan was after her first cycle of treatment. We will arrange for a restaging CT scan when we see her at her next appointment.  I will arrange for follow up with Dr. Barbaraann Cao to establish care for her subacute infarct.  I also gave the patient a handout on stroke symptoms so she knows to seek emergency room evaluation should she develop any  of the symptoms..  Discussed if she has history of stroke that she is at risk for future strokes.   We will see her back for a follow up visit in 3 weeks for evalation and repeat blood work before starting cycle #3.   I help the patient set up her MyChart and gave her a copy of her appointments so she does not miss any future appointments.  We also called the [facility] and gave the patient information on how to have her prescriptions mailed to her due to her transportation constraints.  She has not picked up her Compazine, NicoDerm, and Lomotil.  We also called [employer] and get the patient instructions on how to submit a service request for her oxygen tank.  She uses oxygen as needed.  I discussed the importance of making sure she has her portable oxygen tank when she goes out in public for long durations of time.  The patient was advised to call immediately if she has any concerning symptoms in the interval. The patient voices understanding of current disease status and treatment options and is in agreement with the current care plan. All questions were answered. The patient knows to call the clinic with any problems, questions or concerns. We can certainly see the patient much sooner if necessary       No orders of the defined types were placed in this encounter.    The total time spent in the appointment was 20-29 minutes.   Tiamarie Furnari L Kmari Halter, PA-C 10/01/22

## 2022-10-02 ENCOUNTER — Encounter: Payer: Self-pay | Admitting: Internal Medicine

## 2022-10-02 LAB — CBC WITH DIFFERENTIAL/PLATELET
Absolute Monocytes: 157 cells/uL — ABNORMAL LOW (ref 200–950)
Basophils Absolute: 100 cells/uL (ref 0–200)
Basophils Relative: 0.7 %
Eosinophils Relative: 0.4 %
HCT: 29.2 % — ABNORMAL LOW (ref 35.0–45.0)
Hemoglobin: 9.5 g/dL — ABNORMAL LOW (ref 11.7–15.5)
Lymphs Abs: 930 cells/uL (ref 850–3900)
MCH: 29.7 pg (ref 27.0–33.0)
MCHC: 32.5 g/dL (ref 32.0–36.0)
MCV: 91.3 fL (ref 80.0–100.0)
MPV: 10.1 fL (ref 7.5–12.5)
Neutro Abs: 13056 cells/uL — ABNORMAL HIGH (ref 1500–7800)
Neutrophils Relative %: 91.3 %
RBC: 3.2 10*6/uL — ABNORMAL LOW (ref 3.80–5.10)
Total Lymphocyte: 6.5 %
WBC: 14.3 10*3/uL — ABNORMAL HIGH (ref 3.8–10.8)

## 2022-10-02 LAB — COMPLETE METABOLIC PANEL WITH GFR
ALT: 13 U/L (ref 6–29)
AST: 15 U/L (ref 10–35)
Albumin: 3.4 g/dL — ABNORMAL LOW (ref 3.6–5.1)
Alkaline phosphatase (APISO): 73 U/L (ref 37–153)
Calcium: 9.3 mg/dL (ref 8.6–10.4)
Globulin: 5.4 g/dL (calc) — ABNORMAL HIGH (ref 1.9–3.7)
Glucose, Bld: 55 mg/dL — ABNORMAL LOW (ref 65–99)
Potassium: 3.7 mmol/L (ref 3.5–5.3)
Sodium: 137 mmol/L (ref 135–146)
eGFR: 103 mL/min/{1.73_m2} (ref >=60)

## 2022-10-02 LAB — HIV-1 GENOTYPING (RTI,PI,IN INHBTR)
Date Viral Load Collected: 4102024
HIV-1 Genotype: DETECTED — AB

## 2022-10-02 LAB — HEPATITIS A ANTIBODY, TOTAL: Hepatitis A AB,Total: REACTIVE — AB

## 2022-10-02 LAB — HIV ANTIBODY (ROUTINE TESTING W REFLEX): HIV 1&2 Ab, 4th Generation: REACTIVE — AB

## 2022-10-02 LAB — HEPATITIS B SURFACE ANTIBODY,QUALITATIVE: Hep B S Ab: REACTIVE — AB

## 2022-10-02 LAB — HEPATITIS B CORE ANTIBODY, TOTAL: Hep B Core Total Ab: REACTIVE — AB

## 2022-10-02 LAB — HEPATITIS C ANTIBODY: Hepatitis C Ab: NONREACTIVE

## 2022-10-02 LAB — QUANTIFERON-TB GOLD PLUS
Mitogen-NIL: 1.84 IU/mL
NIL: 0.06 IU/mL
TB1-NIL: <0 IU/mL

## 2022-10-02 LAB — LIPID PANEL
Cholesterol: 152 mg/dL (ref <200)
HDL: 58 mg/dL (ref >=50)
LDL Cholesterol (Calc): 79 mg/dL (calc)
Non-HDL Cholesterol (Calc): 94 mg/dL (calc) (ref <130)

## 2022-10-02 LAB — HIV-1 RNA QUANT-NO REFLEX-BLD: HIV 1 RNA Quant: 3090 Copies/mL — ABNORMAL HIGH

## 2022-10-02 LAB — HIV-1/2 AB - DIFFERENTIATION: HIV-2 Ab: NEGATIVE

## 2022-10-03 ENCOUNTER — Inpatient Hospital Stay: Payer: 59

## 2022-10-03 ENCOUNTER — Inpatient Hospital Stay: Payer: 59 | Admitting: Physician Assistant

## 2022-10-03 ENCOUNTER — Other Ambulatory Visit: Payer: 59

## 2022-10-03 MED FILL — Fosaprepitant Dimeglumine For IV Infusion 150 MG (Base Eq): INTRAVENOUS | Qty: 5 | Status: AC

## 2022-10-03 MED FILL — Dexamethasone Sodium Phosphate Inj 100 MG/10ML: INTRAMUSCULAR | Qty: 1 | Status: AC

## 2022-10-04 ENCOUNTER — Inpatient Hospital Stay (HOSPITAL_BASED_OUTPATIENT_CLINIC_OR_DEPARTMENT_OTHER): Payer: 59 | Admitting: Physician Assistant

## 2022-10-04 ENCOUNTER — Inpatient Hospital Stay: Payer: 59 | Admitting: Dietician

## 2022-10-04 ENCOUNTER — Inpatient Hospital Stay: Payer: 59 | Admitting: Licensed Clinical Social Worker

## 2022-10-04 ENCOUNTER — Inpatient Hospital Stay: Payer: 59

## 2022-10-04 ENCOUNTER — Other Ambulatory Visit: Payer: Self-pay

## 2022-10-04 VITALS — BP 110/76 | HR 88 | Temp 97.9°F | Resp 16 | Wt 132.2 lb

## 2022-10-04 DIAGNOSIS — Z5111 Encounter for antineoplastic chemotherapy: Secondary | ICD-10-CM

## 2022-10-04 DIAGNOSIS — C3490 Malignant neoplasm of unspecified part of unspecified bronchus or lung: Secondary | ICD-10-CM

## 2022-10-04 DIAGNOSIS — Z95828 Presence of other vascular implants and grafts: Secondary | ICD-10-CM | POA: Insufficient documentation

## 2022-10-04 LAB — CBC WITH DIFFERENTIAL (CANCER CENTER ONLY)
Abs Immature Granulocytes: 0.02 10*3/uL (ref 0.00–0.07)
Basophils Absolute: 0 10*3/uL (ref 0.0–0.1)
Basophils Relative: 1 %
Eosinophils Absolute: 0 10*3/uL (ref 0.0–0.5)
Eosinophils Relative: 1 %
HCT: 27.5 % — ABNORMAL LOW (ref 36.0–46.0)
Hemoglobin: 8.8 g/dL — ABNORMAL LOW (ref 12.0–15.0)
Immature Granulocytes: 0 %
Lymphocytes Relative: 22 %
Lymphs Abs: 1 10*3/uL (ref 0.7–4.0)
MCH: 30.6 pg (ref 26.0–34.0)
MCHC: 32 g/dL (ref 30.0–36.0)
MCV: 95.5 fL (ref 80.0–100.0)
Monocytes Absolute: 0.4 10*3/uL (ref 0.1–1.0)
Monocytes Relative: 10 %
Neutro Abs: 3 10*3/uL (ref 1.7–7.7)
Neutrophils Relative %: 66 %
Platelet Count: 284 10*3/uL (ref 150–400)
RBC: 2.88 MIL/uL — ABNORMAL LOW (ref 3.87–5.11)
RDW: 15.9 % — ABNORMAL HIGH (ref 11.5–15.5)
WBC Count: 4.6 10*3/uL (ref 4.0–10.5)
nRBC: 0 % (ref 0.0–0.2)

## 2022-10-04 LAB — CMP (CANCER CENTER ONLY)
ALT: 14 U/L (ref 0–44)
AST: 17 U/L (ref 15–41)
Albumin: 3.4 g/dL — ABNORMAL LOW (ref 3.5–5.0)
Alkaline Phosphatase: 65 U/L (ref 38–126)
Anion gap: 4 — ABNORMAL LOW (ref 5–15)
BUN: 20 mg/dL (ref 8–23)
CO2: 24 mmol/L (ref 22–32)
Calcium: 9.4 mg/dL (ref 8.9–10.3)
Chloride: 108 mmol/L (ref 98–111)
Creatinine: 0.76 mg/dL (ref 0.44–1.00)
GFR, Estimated: >60 mL/min (ref >60)
Glucose, Bld: 93 mg/dL (ref 70–99)
Potassium: 3.9 mmol/L (ref 3.5–5.1)
Sodium: 136 mmol/L (ref 135–145)
Total Bilirubin: 0.2 mg/dL — ABNORMAL LOW (ref 0.3–1.2)
Total Protein: 8.7 g/dL — ABNORMAL HIGH (ref 6.5–8.1)

## 2022-10-04 MED ORDER — SODIUM CHLORIDE 0.9 % IV SOLN
1500.0000 mg | Freq: Once | INTRAVENOUS | Status: AC
  Administered 2022-10-04: 1500 mg via INTRAVENOUS
  Filled 2022-10-04: qty 30

## 2022-10-04 MED ORDER — PALONOSETRON HCL INJECTION 0.25 MG/5ML
0.2500 mg | Freq: Once | INTRAVENOUS | Status: AC
  Administered 2022-10-04: 0.25 mg via INTRAVENOUS
  Filled 2022-10-04: qty 5

## 2022-10-04 MED ORDER — SODIUM CHLORIDE 0.9 % IV SOLN
316.0000 mg | Freq: Once | INTRAVENOUS | Status: AC
  Administered 2022-10-04: 320 mg via INTRAVENOUS
  Filled 2022-10-04: qty 32

## 2022-10-04 MED ORDER — SODIUM CHLORIDE 0.9 % IV SOLN
80.0000 mg/m2 | Freq: Once | INTRAVENOUS | Status: AC
  Administered 2022-10-04: 138 mg via INTRAVENOUS
  Filled 2022-10-04: qty 6.9

## 2022-10-04 MED ORDER — SODIUM CHLORIDE 0.9% FLUSH
10.0000 mL | Freq: Once | INTRAVENOUS | Status: AC
  Administered 2022-10-04: 10 mL

## 2022-10-04 MED ORDER — SODIUM CHLORIDE 0.9% FLUSH
10.0000 mL | INTRAVENOUS | Status: DC | PRN
  Administered 2022-10-04: 10 mL

## 2022-10-04 MED ORDER — SODIUM CHLORIDE 0.9 % IV SOLN
150.0000 mg | Freq: Once | INTRAVENOUS | Status: AC
  Administered 2022-10-04: 150 mg via INTRAVENOUS
  Filled 2022-10-04: qty 150

## 2022-10-04 MED ORDER — ETOPOSIDE CHEMO INJECTION 1 GM/50ML
100.0000 mg/m2 | Freq: Once | INTRAVENOUS | Status: DC
Start: 2022-10-04 — End: 2022-10-04

## 2022-10-04 MED ORDER — SODIUM CHLORIDE 0.9 % IV SOLN
10.0000 mg | Freq: Once | INTRAVENOUS | Status: AC
  Administered 2022-10-04: 10 mg via INTRAVENOUS
  Filled 2022-10-04: qty 10

## 2022-10-04 MED ORDER — SODIUM CHLORIDE 0.9 % IV SOLN: Freq: Once | INTRAVENOUS | Status: AC

## 2022-10-04 MED ORDER — HEPARIN SOD (PORK) LOCK FLUSH 100 UNIT/ML IV SOLN
500.0000 [IU] | Freq: Once | INTRAVENOUS | Status: AC | PRN
  Administered 2022-10-04: 500 [IU]

## 2022-10-04 MED FILL — Dexamethasone Sodium Phosphate Inj 100 MG/10ML: INTRAMUSCULAR | Qty: 1 | Status: AC
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
## 2022-10-04 NOTE — Patient Instructions (Signed)
[facility]  Discharge Instructions: Thank you for choosing [facility] to provide your oncology and hematology care.   If you have a lab appointment with the Cancer Center, please go directly to the Cancer Center and check in at the registration area.   Wear comfortable clothing and clothing appropriate for easy access to any Portacath or PICC line.   We strive to give you quality time with your provider. You may need to reschedule your appointment if you arrive late (15 or more minutes).  Arriving late affects you and other patients whose appointments are after yours.  Also, if you miss three or more appointments without notifying the office, you may be dismissed from the clinic at the provider's discretion.      For prescription refill requests, have your pharmacy contact our office and allow 72 hours for refills to be completed.    Today you received the following chemotherapy and/or immunotherapy agents: Imfinzi, Carboplatin, Etoposide      To help prevent nausea and vomiting after your treatment, we encourage you to take your nausea medication as directed.  BELOW ARE SYMPTOMS THAT SHOULD BE REPORTED IMMEDIATELY: *FEVER GREATER THAN 100.4 F (38 C) OR HIGHER *CHILLS OR SWEATING *NAUSEA AND VOMITING THAT IS NOT CONTROLLED WITH YOUR NAUSEA MEDICATION *UNUSUAL SHORTNESS OF BREATH *UNUSUAL BRUISING OR BLEEDING *URINARY PROBLEMS (pain or burning when urinating, or frequent urination) *BOWEL PROBLEMS (unusual diarrhea, constipation, pain near the anus) TENDERNESS IN MOUTH AND THROAT WITH OR WITHOUT PRESENCE OF ULCERS (sore throat, sores in mouth, or a toothache) UNUSUAL RASH, SWELLING OR PAIN  UNUSUAL VAGINAL DISCHARGE OR ITCHING   Items with * indicate a potential emergency and should be followed up as soon as possible or go to the Emergency Department if any problems should occur.  Please show the CHEMOTHERAPY ALERT CARD or  IMMUNOTHERAPY ALERT CARD at check-in to the Emergency Department and triage nurse.  Should you have questions after your visit or need to cancel or reschedule your appointment, please contact [facility]  Dept: [phone]  and follow the prompts.  Office hours are 8:00 a.m. to 4:30 p.m. Monday - Friday. Please note that voicemails left after 4:00 p.m. may not be returned until the following business day.  We are closed weekends and major holidays. You have access to a nurse at all times for urgent questions. Please call the main number to the clinic Dept: [phone] and follow the prompts.   For any non-urgent questions, you may also contact your provider using MyChart. We now offer e-Visits for anyone 18 and older to request care online for non-urgent symptoms. For details visit [email or web address].   Also download the MyChart app! Go to the app store, search "MyChart", open the app, select [facility], and log in with your MyChart username and password. Durvalumab Injection What is this medication? DURVALUMAB (dur VAL ue mab) treats some types of cancer. It works by helping your immune system slow or stop the spread of cancer cells. It is a monoclonal antibody. This medicine may be used for other purposes; ask your health care provider or pharmacist if you have questions. COMMON BRAND NAME(S): IMFINZI What should I tell my care team before I take this medication? They need to know if you have any of these conditions: Allogeneic stem cell transplant (uses someone else's stem cells) Autoimmune diseases, such as Crohn disease, ulcerative colitis, lupus History of chest radiation Nervous   system problems, such as Guillain-Barre syndrome, myasthenia gravis Organ transplant An unusual or allergic reaction to durvalumab, other medications, foods, dyes, or preservatives Pregnant or trying to get pregnant Breast-feeding How should I use this  medication? This medication is infused into a vein. It is given by your care team in a hospital or clinic setting. A special MedGuide will be given to you before each treatment. Be sure to read this information carefully each time. Talk to your care team about the use of this medication in children. Special care may be needed. Overdosage: If you think you have taken too much of this medicine contact a poison control center or emergency room at once. NOTE: This medicine is only for you. Do not share this medicine with others. What if I miss a dose? Keep appointments for follow-up doses. It is important not to miss your dose. Call your care team if you are unable to keep an appointment. What may interact with this medication? Interactions have not been studied. This list may not describe all possible interactions. Give your health care provider a list of all the medicines, herbs, non-prescription drugs, or dietary supplements you use. Also tell them if you smoke, drink alcohol, or use illegal drugs. Some items may interact with your medicine. What should I watch for while using this medication? Your condition will be monitored carefully while you are receiving this medication. You may need blood work while taking this medication. This medication may cause serious skin reactions. They can happen weeks to months after starting the medication. Contact your care team right away if you notice fevers or flu-like symptoms with a rash. The rash may be red or purple and then turn into blisters or peeling of the skin. You may also notice a red rash with swelling of the face, lips, or lymph nodes in your neck or under your arms. Tell your care team right away if you have any change in your eyesight. Talk to your care team if you may be pregnant. Serious birth defects can occur if you take this medication during pregnancy and for 3 months after the last dose. You will need a negative pregnancy test before starting  this medication. Contraception is recommended while taking this medication and for 3 months after the last dose. Your care team can help you find the option that works for you. Do not breastfeed while taking this medication and for 3 months after the last dose. What side effects may I notice from receiving this medication? Side effects that you should report to your care team as soon as possible: Allergic reactions--skin rash, itching, hives, swelling of the face, lips, tongue, or throat Dry cough, shortness of breath or trouble breathing Eye pain, redness, irritation, or discharge with blurry or decreased vision Heart muscle inflammation--unusual weakness or fatigue, shortness of breath, chest pain, fast or irregular heartbeat, dizziness, swelling of the ankles, feet, or hands Hormone gland problems--headache, sensitivity to light, unusual weakness or fatigue, dizziness, fast or irregular heartbeat, increased sensitivity to cold or heat, excessive sweating, constipation, hair loss, increased thirst or amount of urine, tremors or shaking, irritability Infusion reactions--chest pain, shortness of breath or trouble breathing, feeling faint or lightheaded Kidney injury (glomerulonephritis)--decrease in the amount of urine, red or dark brown urine, foamy or bubbly urine, swelling of the ankles, hands, or feet Liver injury--right upper belly pain, loss of appetite, nausea, light-colored stool, dark yellow or brown urine, yellowing skin or eyes, unusual weakness or fatigue Pain, tingling, or   numbness in the hands or feet, muscle weakness, change in vision, confusion or trouble speaking, loss of balance or coordination, trouble walking, seizures Rash, fever, and swollen lymph nodes Redness, blistering, peeling, or loosening of the skin, including inside the mouth Sudden or severe stomach pain, bloody diarrhea, fever, nausea, vomiting Side effects that usually do not require medical attention (report these  to your care team if they continue or are bothersome): Bone, joint, or muscle pain Diarrhea Fatigue Loss of appetite Nausea Skin rash This list may not describe all possible side effects. Call your doctor for medical advice about side effects. You may report side effects to FDA at 1-800-FDA-1088. Where should I keep my medication? This medication is given in a hospital or clinic. It will not be stored at home. NOTE: This sheet is a summary. It may not cover all possible information. If you have questions about this medicine, talk to your doctor, pharmacist, or health care provider.  2023 Elsevier/Gold Standard (2021-10-01 00:00:00) Carboplatin Injection What is this medication? CARBOPLATIN (KAR boe pla tin) treats some types of cancer. It works by slowing down the growth of cancer cells. This medicine may be used for other purposes; ask your health care provider or pharmacist if you have questions. COMMON BRAND NAME(S): Paraplatin What should I tell my care team before I take this medication? They need to know if you have any of these conditions: Blood disorders Hearing problems Kidney disease Recent or ongoing radiation therapy An unusual or allergic reaction to carboplatin, cisplatin, other medications, foods, dyes, or preservatives Pregnant or trying to get pregnant Breast-feeding How should I use this medication? This medication is injected into a vein. It is given by your care team in a hospital or clinic setting. Talk to your care team about the use of this medication in children. Special care may be needed. Overdosage: If you think you have taken too much of this medicine contact a poison control center or emergency room at once. NOTE: This medicine is only for you. Do not share this medicine with others. What if I miss a dose? Keep appointments for follow-up doses. It is important not to miss your dose. Call your care team if you are unable to keep an appointment. What may  interact with this medication? Medications for seizures Some antibiotics, such as amikacin, gentamicin, neomycin, streptomycin, tobramycin Vaccines This list may not describe all possible interactions. Give your health care provider a list of all the medicines, herbs, non-prescription drugs, or dietary supplements you use. Also tell them if you smoke, drink alcohol, or use illegal drugs. Some items may interact with your medicine. What should I watch for while using this medication? Your condition will be monitored carefully while you are receiving this medication. You may need blood work while taking this medication. This medication may make you feel generally unwell. This is not uncommon, as chemotherapy can affect healthy cells as well as cancer cells. Report any side effects. Continue your course of treatment even though you feel ill unless your care team tells you to stop. In some cases, you may be given additional medications to help with side effects. Follow all directions for their use. This medication may increase your risk of getting an infection. Call your care team for advice if you get a fever, chills, sore throat, or other symptoms of a cold or flu. Do not treat yourself. Try to avoid being around people who are sick. Avoid taking medications that contain aspirin, acetaminophen, ibuprofen, naproxen,   or ketoprofen unless instructed by your care team. These medications may hide a fever. Be careful brushing or flossing your teeth or using a toothpick because you may get an infection or bleed more easily. If you have any dental work done, tell your dentist you are receiving this medication. Talk to your care team if you wish to become pregnant or think you might be pregnant. This medication can cause serious birth defects. Talk to your care team about effective forms of contraception. Do not breast-feed while taking this medication. What side effects may I notice from receiving this  medication? Side effects that you should report to your care team as soon as possible: Allergic reactions--skin rash, itching, hives, swelling of the face, lips, tongue, or throat Infection--fever, chills, cough, sore throat, wounds that don't heal, pain or trouble when passing urine, general feeling of discomfort or being unwell Low red blood cell level--unusual weakness or fatigue, dizziness, headache, trouble breathing Pain, tingling, or numbness in the hands or feet, muscle weakness, change in vision, confusion or trouble speaking, loss of balance or coordination, trouble walking, seizures Unusual bruising or bleeding Side effects that usually do not require medical attention (report to your care team if they continue or are bothersome): Hair loss Nausea Unusual weakness or fatigue Vomiting This list may not describe all possible side effects. Call your doctor for medical advice about side effects. You may report side effects to FDA at 1-800-FDA-1088. Where should I keep my medication? This medication is given in a hospital or clinic. It will not be stored at home. NOTE: This sheet is a summary. It may not cover all possible information. If you have questions about this medicine, talk to your doctor, pharmacist, or health care provider.  2023 Elsevier/Gold Standard (2007-07-21 00:00:00) Etoposide Injection What is this medication? ETOPOSIDE (e toe POE side) treats some types of cancer. It works by slowing down the growth of cancer cells. This medicine may be used for other purposes; ask your health care provider or pharmacist if you have questions. COMMON BRAND NAME(S): Etopophos, Toposar, VePesid What should I tell my care team before I take this medication? They need to know if you have any of these conditions: Infection Kidney disease Liver disease Low blood counts, such as low white cell, platelet, red cell counts An unusual or allergic reaction to etoposide, other medications,  foods, dyes, or preservatives If you or your partner are pregnant or trying to get pregnant Breastfeeding How should I use this medication? This medication is injected into a vein. It is given by your care team in a hospital or clinic setting. Talk to your care team about the use of this medication in children. Special care may be needed. Overdosage: If you think you have taken too much of this medicine contact a poison control center or emergency room at once. NOTE: This medicine is only for you. Do not share this medicine with others. What if I miss a dose? Keep appointments for follow-up doses. It is important not to miss your dose. Call your care team if you are unable to keep an appointment. What may interact with this medication? Warfarin This list may not describe all possible interactions. Give your health care provider a list of all the medicines, herbs, non-prescription drugs, or dietary supplements you use. Also tell them if you smoke, drink alcohol, or use illegal drugs. Some items may interact with your medicine. What should I watch for while using this medication? Your condition will be   monitored carefully while you are receiving this medication. This medication may make you feel generally unwell. This is not uncommon as chemotherapy can affect healthy cells as well as cancer cells. Report any side effects. Continue your course of treatment even though you feel ill unless your care team tells you to stop. This medication can cause serious side effects. To reduce the risk, your care team may give you other medications to take before receiving this one. Be sure to follow the directions from your care team. This medication may increase your risk of getting an infection. Call your care team for advice if you get a fever, chills, sore throat, or other symptoms of a cold or flu. Do not treat yourself. Try to avoid being around people who are sick. This medication may increase your risk to  bruise or bleed. Call your care team if you notice any unusual bleeding. Talk to your care team about your risk of cancer. You may be more at risk for certain types of cancers if you take this medication. Talk to your care team if you may be pregnant. Serious birth defects can occur if you take this medication during pregnancy and for 6 months after the last dose. You will need a negative pregnancy test before starting this medication. Contraception is recommended while taking this medication and for 6 months after the last dose. Your care team can help you find the option that works for you. If your partner can get pregnant, use a condom during sex while taking this medication and for 4 months after the last dose. Do not breastfeed while taking this medication. This medication may cause infertility. Talk to your care team if you are concerned about your fertility. What side effects may I notice from receiving this medication? Side effects that you should report to your care team as soon as possible: Allergic reactions--skin rash, itching, hives, swelling of the face, lips, tongue, or throat Infection--fever, chills, cough, sore throat, wounds that don't heal, pain or trouble when passing urine, general feeling of discomfort or being unwell Low red blood cell level--unusual weakness or fatigue, dizziness, headache, trouble breathing Unusual bruising or bleeding Side effects that usually do not require medical attention (report to your care team if they continue or are bothersome): Diarrhea Fatigue Hair loss Loss of appetite Nausea Vomiting This list may not describe all possible side effects. Call your doctor for medical advice about side effects. You may report side effects to FDA at 1-800-FDA-1088. Where should I keep my medication? This medication is given in a hospital or clinic. It will not be stored at home. NOTE: This sheet is a summary. It may not cover all possible information. If you  have questions about this medicine, talk to your doctor, pharmacist, or health care provider.  2023 Elsevier/Gold Standard (2007-07-21 00:00:00)   

## 2022-10-04 NOTE — Progress Notes (Signed)
Patient interested in setting up mail order through [employer].  This nurse gave patient instructions and the phone number to the pharmacy.  Patient also states that she is having trouble with her oxygen concentrator, advised to give them a call at the service number that's posted on the concentrator. She acknowledged understanding, no further questions noted at this time.

## 2022-10-04 NOTE — Progress Notes (Signed)
Keep Etoposide dosed at 80 mg/m2 per Cassie Heilingoetter, PA.  Ebony Hail, Pharm.D., CPP 10/04/2022@9 :57 AM

## 2022-10-04 NOTE — Progress Notes (Signed)
[facility] CSW Progress Note  Visual merchandiser met with patient in infusion to provide with 2 [employer].  CSW to remain available as appropriate to provide support throughout duration of treatment.      Rachel Moulds, LCSW    Patient is participating in a Managed Medicaid Plan:  Yes

## 2022-10-05 ENCOUNTER — Encounter: Payer: Self-pay | Admitting: Nurse Practitioner

## 2022-10-05 ENCOUNTER — Other Ambulatory Visit: Payer: Self-pay

## 2022-10-05 ENCOUNTER — Inpatient Hospital Stay: Payer: 59

## 2022-10-05 VITALS — BP 131/81 | HR 80 | Temp 97.8°F | Resp 17

## 2022-10-05 DIAGNOSIS — C3490 Malignant neoplasm of unspecified part of unspecified bronchus or lung: Secondary | ICD-10-CM

## 2022-10-05 DIAGNOSIS — Z5111 Encounter for antineoplastic chemotherapy: Secondary | ICD-10-CM | POA: Diagnosis not present

## 2022-10-05 MED ORDER — SODIUM CHLORIDE 0.9 % IV SOLN
80.0000 mg/m2 | Freq: Once | INTRAVENOUS | Status: AC
  Administered 2022-10-05: 138 mg via INTRAVENOUS
  Filled 2022-10-05: qty 6.9

## 2022-10-05 MED ORDER — SODIUM CHLORIDE 0.9 % IV SOLN
10.0000 mg | Freq: Once | INTRAVENOUS | Status: AC
  Administered 2022-10-05: 10 mg via INTRAVENOUS
  Filled 2022-10-05: qty 10

## 2022-10-05 MED ORDER — SODIUM CHLORIDE 0.9 % IV SOLN: Freq: Once | INTRAVENOUS | Status: AC

## 2022-10-05 MED FILL — Dexamethasone Sodium Phosphate Inj 100 MG/10ML: INTRAMUSCULAR | Qty: 1 | Status: AC

## 2022-10-05 NOTE — Patient Instructions (Signed)
[facility]  Discharge Instructions: Thank you for choosing [facility] to provide your oncology and hematology care.   If you have a lab appointment with the Cancer Center, please go directly to the Cancer Center and check in at the registration area.   Wear comfortable clothing and clothing appropriate for easy access to any Portacath or PICC line.   We strive to give you quality time with your provider. You may need to reschedule your appointment if you arrive late (15 or more minutes).  Arriving late affects you and other patients whose appointments are after yours.  Also, if you miss three or more appointments without notifying the office, you may be dismissed from the clinic at the provider's discretion.      For prescription refill requests, have your pharmacy contact our office and allow 72 hours for refills to be completed.    Today you received the following chemotherapy and/or immunotherapy agents: etoposide      To help prevent nausea and vomiting after your treatment, we encourage you to take your nausea medication as directed.  BELOW ARE SYMPTOMS THAT SHOULD BE REPORTED IMMEDIATELY: *FEVER GREATER THAN 100.4 F (38 C) OR HIGHER *CHILLS OR SWEATING *NAUSEA AND VOMITING THAT IS NOT CONTROLLED WITH YOUR NAUSEA MEDICATION *UNUSUAL SHORTNESS OF BREATH *UNUSUAL BRUISING OR BLEEDING *URINARY PROBLEMS (pain or burning when urinating, or frequent urination) *BOWEL PROBLEMS (unusual diarrhea, constipation, pain near the anus) TENDERNESS IN MOUTH AND THROAT WITH OR WITHOUT PRESENCE OF ULCERS (sore throat, sores in mouth, or a toothache) UNUSUAL RASH, SWELLING OR PAIN  UNUSUAL VAGINAL DISCHARGE OR ITCHING   Items with * indicate a potential emergency and should be followed up as soon as possible or go to the Emergency Department if any problems should occur.  Please show the CHEMOTHERAPY ALERT CARD or IMMUNOTHERAPY ALERT CARD at  check-in to the Emergency Department and triage nurse.  Should you have questions after your visit or need to cancel or reschedule your appointment, please contact [facility]  Dept: [phone]  and follow the prompts.  Office hours are 8:00 a.m. to 4:30 p.m. Monday - Friday. Please note that voicemails left after 4:00 p.m. may not be returned until the following business day.  We are closed weekends and major holidays. You have access to a nurse at all times for urgent questions. Please call the main number to the clinic Dept: [phone] and follow the prompts.   For any non-urgent questions, you may also contact your provider using MyChart. We now offer e-Visits for anyone 18 and older to request care online for non-urgent symptoms. For details visit [email or web address].   Also download the MyChart app! Go to the app store, search "MyChart", open the app, select [facility], and log in with your MyChart username and password.   

## 2022-10-06 ENCOUNTER — Inpatient Hospital Stay: Payer: 59

## 2022-10-06 VITALS — BP 121/66 | HR 75 | Temp 98.0°F | Resp 16

## 2022-10-06 DIAGNOSIS — C3490 Malignant neoplasm of unspecified part of unspecified bronchus or lung: Secondary | ICD-10-CM

## 2022-10-06 DIAGNOSIS — Z5111 Encounter for antineoplastic chemotherapy: Secondary | ICD-10-CM | POA: Diagnosis not present

## 2022-10-06 MED ORDER — SODIUM CHLORIDE 0.9 % IV SOLN
10.0000 mg | Freq: Once | INTRAVENOUS | Status: AC
  Administered 2022-10-06: 10 mg via INTRAVENOUS
  Filled 2022-10-06: qty 10

## 2022-10-06 MED ORDER — SODIUM CHLORIDE 0.9 % IV SOLN
80.0000 mg/m2 | Freq: Once | INTRAVENOUS | Status: AC
  Administered 2022-10-06: 138 mg via INTRAVENOUS
  Filled 2022-10-06: qty 6.9

## 2022-10-06 MED ORDER — SODIUM CHLORIDE 0.9% FLUSH
10.0000 mL | INTRAVENOUS | Status: DC | PRN
  Administered 2022-10-06: 10 mL

## 2022-10-06 MED ORDER — SODIUM CHLORIDE 0.9 % IV SOLN: Freq: Once | INTRAVENOUS | Status: AC

## 2022-10-06 MED ORDER — HEPARIN SOD (PORK) LOCK FLUSH 100 UNIT/ML IV SOLN
500.0000 [IU] | Freq: Once | INTRAVENOUS | Status: AC | PRN
  Administered 2022-10-06: 500 [IU]

## 2022-10-06 NOTE — Patient Instructions (Signed)
[facility]  Discharge Instructions: Thank you for choosing [facility] to provide your oncology and hematology care.   If you have a lab appointment with the Cancer Center, please go directly to the Cancer Center and check in at the registration area.   Wear comfortable clothing and clothing appropriate for easy access to any Portacath or PICC line.   We strive to give you quality time with your provider. You may need to reschedule your appointment if you arrive late (15 or more minutes).  Arriving late affects you and other patients whose appointments are after yours.  Also, if you miss three or more appointments without notifying the office, you may be dismissed from the clinic at the provider's discretion.      For prescription refill requests, have your pharmacy contact our office and allow 72 hours for refills to be completed.    Today you received the following chemotherapy and/or immunotherapy agents: etoposide      To help prevent nausea and vomiting after your treatment, we encourage you to take your nausea medication as directed.  BELOW ARE SYMPTOMS THAT SHOULD BE REPORTED IMMEDIATELY: *FEVER GREATER THAN 100.4 F (38 C) OR HIGHER *CHILLS OR SWEATING *NAUSEA AND VOMITING THAT IS NOT CONTROLLED WITH YOUR NAUSEA MEDICATION *UNUSUAL SHORTNESS OF BREATH *UNUSUAL BRUISING OR BLEEDING *URINARY PROBLEMS (pain or burning when urinating, or frequent urination) *BOWEL PROBLEMS (unusual diarrhea, constipation, pain near the anus) TENDERNESS IN MOUTH AND THROAT WITH OR WITHOUT PRESENCE OF ULCERS (sore throat, sores in mouth, or a toothache) UNUSUAL RASH, SWELLING OR PAIN  UNUSUAL VAGINAL DISCHARGE OR ITCHING   Items with * indicate a potential emergency and should be followed up as soon as possible or go to the Emergency Department if any problems should occur.  Please show the CHEMOTHERAPY ALERT CARD or IMMUNOTHERAPY ALERT CARD at  check-in to the Emergency Department and triage nurse.  Should you have questions after your visit or need to cancel or reschedule your appointment, please contact [facility]  Dept: [phone]  and follow the prompts.  Office hours are 8:00 a.m. to 4:30 p.m. Monday - Friday. Please note that voicemails left after 4:00 p.m. may not be returned until the following business day.  We are closed weekends and major holidays. You have access to a nurse at all times for urgent questions. Please call the main number to the clinic Dept: [phone] and follow the prompts.   For any non-urgent questions, you may also contact your provider using MyChart. We now offer e-Visits for anyone 18 and older to request care online for non-urgent symptoms. For details visit [email or web address].   Also download the MyChart app! Go to the app store, search "MyChart", open the app, select [facility], and log in with your MyChart username and password.   

## 2022-10-08 ENCOUNTER — Inpatient Hospital Stay: Payer: 59

## 2022-10-08 VITALS — BP 129/68 | HR 109 | Temp 97.8°F | Resp 18

## 2022-10-08 DIAGNOSIS — C3490 Malignant neoplasm of unspecified part of unspecified bronchus or lung: Secondary | ICD-10-CM

## 2022-10-08 DIAGNOSIS — Z5111 Encounter for antineoplastic chemotherapy: Secondary | ICD-10-CM | POA: Diagnosis not present

## 2022-10-08 MED ORDER — PEGFILGRASTIM-CBQV 6 MG/0.6ML ~~LOC~~ SOSY
6.0000 mg | PREFILLED_SYRINGE | Freq: Once | SUBCUTANEOUS | Status: AC
  Administered 2022-10-08: 6 mg via SUBCUTANEOUS

## 2022-10-11 ENCOUNTER — Other Ambulatory Visit: Payer: Self-pay

## 2022-10-11 ENCOUNTER — Other Ambulatory Visit: Payer: Self-pay | Admitting: Physician Assistant

## 2022-10-11 ENCOUNTER — Other Ambulatory Visit: Payer: 59

## 2022-10-11 ENCOUNTER — Inpatient Hospital Stay (HOSPITAL_BASED_OUTPATIENT_CLINIC_OR_DEPARTMENT_OTHER): Payer: 59 | Admitting: Internal Medicine

## 2022-10-11 ENCOUNTER — Other Ambulatory Visit (HOSPITAL_COMMUNITY): Payer: Self-pay

## 2022-10-11 ENCOUNTER — Inpatient Hospital Stay: Payer: 59

## 2022-10-11 VITALS — BP 125/57 | HR 96 | Temp 99.4°F | Resp 18 | Ht 69.0 in | Wt 134.9 lb

## 2022-10-11 DIAGNOSIS — R9089 Other abnormal findings on diagnostic imaging of central nervous system: Secondary | ICD-10-CM | POA: Diagnosis not present

## 2022-10-11 DIAGNOSIS — C3490 Malignant neoplasm of unspecified part of unspecified bronchus or lung: Secondary | ICD-10-CM

## 2022-10-11 DIAGNOSIS — R197 Diarrhea, unspecified: Secondary | ICD-10-CM

## 2022-10-11 DIAGNOSIS — Z95828 Presence of other vascular implants and grafts: Secondary | ICD-10-CM

## 2022-10-11 DIAGNOSIS — Z5111 Encounter for antineoplastic chemotherapy: Secondary | ICD-10-CM | POA: Diagnosis not present

## 2022-10-11 LAB — CBC WITH DIFFERENTIAL (CANCER CENTER ONLY)
Abs Immature Granulocytes: 0 10*3/uL (ref 0.00–0.07)
Band Neutrophils: 2 %
Basophils Absolute: 0 10*3/uL (ref 0.0–0.1)
Basophils Relative: 0 %
Blasts: 0 %
Eosinophils Absolute: 0 10*3/uL (ref 0.0–0.5)
Eosinophils Relative: 0 %
HCT: 25.3 % — ABNORMAL LOW (ref 36.0–46.0)
Hemoglobin: 8.3 g/dL — ABNORMAL LOW (ref 12.0–15.0)
Lymphocytes Relative: 8 %
Lymphs Abs: 2.4 10*3/uL (ref 0.7–4.0)
MCH: 31.8 pg (ref 26.0–34.0)
MCHC: 32.8 g/dL (ref 30.0–36.0)
MCV: 96.9 fL (ref 80.0–100.0)
Metamyelocytes Relative: 0 %
Monocytes Absolute: 0.3 10*3/uL (ref 0.1–1.0)
Monocytes Relative: 1 %
Myelocytes: 0 %
Neutro Abs: 27.4 10*3/uL — ABNORMAL HIGH (ref 1.7–7.7)
Neutrophils Relative %: 89 %
Other: 0 %
Platelet Count: 359 10*3/uL (ref 150–400)
Promyelocytes Relative: 0 %
RBC: 2.61 MIL/uL — ABNORMAL LOW (ref 3.87–5.11)
RDW: 16 % — ABNORMAL HIGH (ref 11.5–15.5)
Smear Review: NORMAL
WBC Count: 30.1 10*3/uL — ABNORMAL HIGH (ref 4.0–10.5)
nRBC: 0 % (ref 0.0–0.2)
nRBC: 0 /100 WBC

## 2022-10-11 LAB — CMP (CANCER CENTER ONLY)
ALT: 12 U/L (ref 0–44)
AST: 13 U/L — ABNORMAL LOW (ref 15–41)
Albumin: 3.5 g/dL (ref 3.5–5.0)
Alkaline Phosphatase: 115 U/L (ref 38–126)
Anion gap: 3 — ABNORMAL LOW (ref 5–15)
BUN: 18 mg/dL (ref 8–23)
CO2: 27 mmol/L (ref 22–32)
Calcium: 9.6 mg/dL (ref 8.9–10.3)
Chloride: 104 mmol/L (ref 98–111)
Creatinine: 0.62 mg/dL (ref 0.44–1.00)
GFR, Estimated: >60 mL/min (ref >60)
Glucose, Bld: 105 mg/dL — ABNORMAL HIGH (ref 70–99)
Potassium: 3.9 mmol/L (ref 3.5–5.1)
Sodium: 134 mmol/L — ABNORMAL LOW (ref 135–145)
Total Bilirubin: 0.2 mg/dL — ABNORMAL LOW (ref 0.3–1.2)
Total Protein: 8 g/dL (ref 6.5–8.1)

## 2022-10-11 LAB — SAMPLE TO BLOOD BANK

## 2022-10-11 MED ORDER — ASPIRIN 325 MG PO CAPS: 325.0000 mg | ORAL_CAPSULE | Freq: Every day | ORAL | Status: DC

## 2022-10-11 MED ORDER — BACLOFEN 10 MG PO TABS: 10.0000 mg | ORAL_TABLET | Freq: Three times a day (TID) | ORAL | 0 refills | Status: DC | PRN

## 2022-10-11 MED ORDER — DIPHENOXYLATE-ATROPINE 2.5-0.025 MG PO TABS
1.0000 | ORAL_TABLET | Freq: Four times a day (QID) | ORAL | 0 refills | Status: DC | PRN
Start: 2022-10-11 — End: 2023-07-18
  Filled 2022-10-11: qty 30, 8d supply, fill #0

## 2022-10-11 MED ORDER — SODIUM CHLORIDE 0.9% FLUSH
10.0000 mL | Freq: Once | INTRAVENOUS | Status: AC
  Administered 2022-10-11: 10 mL

## 2022-10-11 MED ORDER — HEPARIN SOD (PORK) LOCK FLUSH 100 UNIT/ML IV SOLN
500.0000 [IU] | Freq: Once | INTRAVENOUS | Status: AC
  Administered 2022-10-11: 500 [IU]

## 2022-10-11 MED ORDER — BACLOFEN 10 MG PO TABS
10.0000 mg | ORAL_TABLET | Freq: Three times a day (TID) | ORAL | 0 refills | Status: DC | PRN
  Filled 2022-10-11: qty 30, 10d supply, fill #0

## 2022-10-11 NOTE — Progress Notes (Signed)
[facility] [address]  [place], [place] [address] [phone]   New Patient Evaluation  Date of Service: 10/11/22 Patient Name: [name] Patient MRN: [id number] Patient DOB: 09/25/1955 Provider: Henreitta Leber, MD  Identifying Statement:  [name] is a 67 y.o. female with Abnormal brain MRI - Plan: MR BRAIN W WO CONTRAST who presents for initial consultation and evaluation regarding cancer associated neurologic deficits.    Referring Provider: [name], MD [address] [place],  [place] [address]  Primary Cancer:  Oncologic History: Oncology History  Small cell lung cancer (HCC)  09/06/2022 Initial Diagnosis   Small cell lung cancer (HCC)   09/14/2022 -  Chemotherapy   Patient is on Treatment Plan : LUNG SMALL CELL EXTENSIVE STAGE Durvalumab + Carboplatin D1 + Etoposide D1-3 q21d x 4 Cycles / Durvalumab q28d       History of Present Illness: The patient's records from the referring physician were obtained and reviewed and the patient interviewed to confirm this HPI.  [name] presents today to review findings of recent lung cancer staging brain MRI.  She denies any neurologic symptoms. No headaches, seizures.  Continues on chemotherapy with Dr. Arbutus Ped, cisplatin and etoposide.  Has been dosing HIV medication without complication as well.  Medications: Current Outpatient Medications on File Prior to Visit  Medication Sig Dispense Refill   acetaminophen (TYLENOL) 500 MG tablet Take 1,000 mg by mouth every 4 (four) hours as needed. For pain     diphenoxylate-atropine (LOMOTIL) 2.5-0.025 MG tablet Take 1 tablet by mouth 4 (four) times daily as needed for diarrhea or loose stools. (Patient not taking: Reported on 09/21/2022) 30 tablet 0   dolutegravir-lamiVUDine (DOVATO) 50-300 MG tablet Take 1 tablet by mouth daily. 30 tablet 11   folic acid (FOLVITE) 1 MG tablet Take 1 tablet (1 mg total) by mouth daily. 90  tablet 0   guaifenesin (ROBITUSSIN) 100 MG/5ML syrup Take 200 mg by mouth 3 (three) times daily as needed for cough. (Patient not taking: Reported on 09/21/2022)     lidocaine-prilocaine (EMLA) cream Apply 1 Application topically as needed. 30 g 2   nicotine (NICODERM CQ) 7 mg/24hr patch Place 1 patch (7 mg total) onto the skin daily. (Patient not taking: Reported on 09/21/2022) 28 patch 0   nicotine polacrilex (NICORETTE) 4 MG gum Take 1 each (4 mg total) by mouth as needed for smoking cessation. (Patient not taking: Reported on 09/14/2022) 30 tablet 2   prochlorperazine (COMPAZINE) 10 MG tablet Take 1 tablet (10 mg total) by mouth every 6 (six) hours as needed. (Patient not taking: Reported on 09/14/2022) 30 tablet 2   thiamine (VITAMIN B-1) 100 MG tablet Take 1 tablet (100 mg total) by mouth daily. (Patient not taking: Reported on 09/21/2022) 90 tablet 0   [DISCONTINUED] rivaroxaban (XARELTO) 10 MG TABS tablet Take 1 tablet (10 mg total) by mouth daily. 14 tablet 0   No current facility-administered medications on file prior to visit.    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   signed out ama-has never gone back to have worked up   Past Surgical History:  Past Surgical History:  Procedure Laterality Date   BRONCHIAL NEEDLE ASPIRATION BIOPSY  09/02/2022   Procedure:  BRONCHIAL NEEDLE ASPIRATION BIOPSIES;  Surgeon: Josephine Igo, DO;  Location: [facility];  Service: Cardiopulmonary;;   IR IMAGING GUIDED PORT INSERTION  09/29/2022   MULTIPLE TOOTH EXTRACTIONS     ORIF ANKLE FRACTURE  05/03/2012   Procedure: OPEN REDUCTION INTERNAL FIXATION (ORIF) ANKLE FRACTURE;  Surgeon: Toni Arthurs, MD;  Location: [facility] OR;  Service: Orthopedics;  Laterality: Left;   VIDEO BRONCHOSCOPY WITH ENDOBRONCHIAL ULTRASOUND  Bilateral 09/02/2022   Procedure: VIDEO BRONCHOSCOPY WITH ENDOBRONCHIAL ULTRASOUND;  Surgeon: Josephine Igo, DO;  Location: [facility];  Service: Cardiopulmonary;  Laterality: Bilateral;   Social History:  Social History   Socioeconomic History   Marital status: Widowed    Spouse name: Not on file   Number of children: Not on file   Years of education: Not on file   Highest education level: Not on file  Occupational History   Not on file  Tobacco Use   Smoking status: Every Day    Packs/day: 0.50    Years: 30.00    Additional pack years: 0.00    Total pack years: 15.00    Types: Cigarettes   Smokeless tobacco: Not on file  Vaping Use   Vaping Use: Never used  Substance and Sexual Activity   Alcohol use: Not Currently    Comment: 3x's / month    Drug use: Not Currently    Types: Cocaine, "Crack" cocaine    Comment: reports she quit smoking crack cocaine around 2020   Sexual activity: Not Currently  Other Topics Concern   Not on file  Social History Narrative   Not on file   Social Determinants of Health   Financial Resource Strain: Not on file  Food Insecurity: No Food Insecurity (09/02/2022)   Hunger Vital Sign    Worried About Running Out of Food in the Last Year: Never true    Ran Out of Food in the Last Year: Never true  Transportation Needs: No Transportation Needs (09/02/2022)   PRAPARE - Administrator, Civil Service (Medical): No    Lack of Transportation (Non-Medical): No  Physical Activity: Not on file  Stress: Not on file  Social Connections: Not on file  Intimate Partner Violence: Not At Risk (09/02/2022)   Humiliation, Afraid, Rape, and Kick questionnaire    Fear of Current or Ex-Partner: No    Emotionally Abused: No    Physically Abused: No    Sexually Abused: No   Family History:  Family History  Problem Relation Age of Onset   Hypertension Mother    Diabetes Mother    Heart disease Sister    Heart disease Sister     Review  of Systems: Constitutional: Doesn't report fevers, chills or abnormal weight loss Eyes: Doesn't report blurriness of vision Ears, nose, mouth, throat, and face: Doesn't report sore throat Respiratory: Doesn't report cough, dyspnea or wheezes Cardiovascular: Doesn't report palpitation, chest discomfort  Gastrointestinal:  Doesn't report nausea, constipation, diarrhea GU: Doesn't report incontinence Skin: Doesn't report skin rashes Neurological: Per HPI Musculoskeletal: Doesn't report joint pain Behavioral/Psych: Doesn't report anxiety  Physical Exam: Vitals:   10/11/22 1401  BP: (!) 125/57  Pulse: 96  Resp: 18  Temp: 99.4 F (37.4 C)  SpO2: 99%   KPS: 90. General: Alert, cooperative, pleasant, in no acute distress Head: Normal EENT: No conjunctival injection or scleral icterus.  Lungs: Resp effort normal Cardiac: Regular rate Abdomen: Non-distended abdomen Skin: No rashes cyanosis or petechiae. Extremities: No clubbing or edema  Neurologic  Exam: Mental Status: Awake, alert, attentive to examiner. Oriented to self and environment. Language is fluent with intact comprehension.  Cranial Nerves: Visual acuity is grossly normal. Visual fields are full. Extra-ocular movements intact. No ptosis. Face is symmetric Motor: Tone and bulk are normal. Power is full in both arms and legs. Reflexes are symmetric, no pathologic reflexes present.  Sensory: Intact to light touch Gait: Normal.   Labs: I have reviewed the data as listed    Component Value Date/Time   NA 136 10/04/2022 0836   K 3.9 10/04/2022 0836   CL 108 10/04/2022 0836   CO2 24 10/04/2022 0836   GLUCOSE 93 10/04/2022 0836   BUN 20 10/04/2022 0836   CREATININE 0.76 10/04/2022 0836   CREATININE 0.53 09/21/2022 0942   CALCIUM 9.4 10/04/2022 0836   PROT 8.7 (H) 10/04/2022 0836   ALBUMIN 3.4 (L) 10/04/2022 0836   AST 17 10/04/2022 0836   ALT 14 10/04/2022 0836   ALKPHOS 65 10/04/2022 0836   BILITOT 0.2 (L)  10/04/2022 0836   GFRNONAA >60 10/04/2022 0836   GFRAA 87 (L) 05/02/2012 0959   Lab Results  Component Value Date   WBC 4.6 10/04/2022   NEUTROABS 3.0 10/04/2022   HGB 8.8 (L) 10/04/2022   HCT 27.5 (L) 10/04/2022   MCV 95.5 10/04/2022   PLT 284 10/04/2022    Imaging:  IR IMAGING GUIDED PORT INSERTION  Result Date: 09/29/2022 INDICATION: RIGHT lung mass. EXAM: IMPLANTED PORT A CATH PLACEMENT WITH ULTRASOUND AND FLUOROSCOPIC GUIDANCE MEDICATIONS: None ANESTHESIA/SEDATION: Local anesthetic and single agent sedation was employed during this procedure. A total of fentanyl 100 mcg was administered intravenously. The patient's level of consciousness and vital signs were monitored continuously by radiology nursing throughout the procedure under my direct supervision. FLUOROSCOPY TIME:  Fluoroscopic dose; 0 mGy COMPLICATIONS: None immediate. PROCEDURE: The procedure, risks, benefits, and alternatives were explained to the patient. Questions regarding the procedure were encouraged and answered. The patient understands and consents to the procedure. The LEFT neck and chest were prepped with chlorhexidine in a sterile fashion, and a sterile drape was applied covering the operative field. Maximum barrier sterile technique with sterile gowns and gloves were used for the procedure. A timeout was performed prior to the initiation of the procedure. Local anesthesia was provided with 1% lidocaine with epinephrine. After creating a small venotomy incision, a micropuncture kit was utilized to access the internal jugular vein under direct, real-time ultrasound guidance. Ultrasound image documentation was performed. The microwire was kinked to measure appropriate catheter length. A subcutaneous port pocket was then created along the upper chest wall utilizing a combination of sharp and blunt dissection. The pocket was irrigated with sterile saline. A single lumen Non-ISP power injectable port was chosen for placement.  The 8 Fr catheter was tunneled from the port pocket site to the venotomy incision. The port was placed in the pocket. The external catheter was trimmed to appropriate length. At the venotomy, an 8 Fr peel-away sheath was placed over a guidewire under fluoroscopic guidance. The catheter was then placed through the sheath and the sheath was removed. Final catheter positioning was confirmed and documented with a fluoroscopic spot radiograph. The port was accessed with a Huber needle, aspirated and flushed with heparinized saline. The port pocket incision was closed with interrupted 3-0 Vicryl suture then Dermabond was applied, including at the venotomy incision. Dressings were placed. The patient tolerated the procedure well without immediate post procedural complication. IMPRESSION: Successful placement of a LEFT internal  jugular approach power injectable Port-A-Cath. The tip of the catheter is positioned at the superior cavo-atrial junction. The catheter is ready for immediate use. Joan Banning, MD [facility] Radiology Electronically Signed   By: [name] M.D.   On: 09/29/2022 15:46   NM PET Image Initial (PI) Skull Base To Thigh (F-18 FDG)  Result Date: 09/25/2022 CLINICAL DATA:  Initial treatment strategy for non-small cell lung cancer. Undergoing chemotherapy since 09/14/2022. Recent diagnosis of HIV infection. EXAM: NUCLEAR MEDICINE PET SKULL BASE TO THIGH TECHNIQUE: 6.48 mCi F-18 FDG was injected intravenously. Full-ring PET imaging was performed from the skull base to thigh after the radiotracer. CT data was obtained and used for attenuation correction and anatomic localization. Fasting blood glucose: 81 mg/dl COMPARISON:  Chest CT 57/84/6962 FINDINGS: Mediastinal blood pool activity: SUV max 1.6 NECK: Images through the head and neck are degraded by motion and resulting misregistration between the PET and CT data. No hypermetabolic cervical lymph nodes  are identified.Fairly symmetric activity within the lymphoid tissue of Waldeyer's ring is within physiologic limits.No suspicious activity identified within the pharyngeal mucosal space. Incidental CT findings: Bilateral carotid atherosclerosis. CHEST: There are several hypermetabolic mediastinal and hilar lymph nodes. The most intense hypermetabolic activity is within largest right paratracheal nodal mass which currently measures approximately 3.6 x 2.0 cm on image 61/4 and has an SUV max of 11.3. This nodal mass appears slightly smaller than on the recent chest CT. Lesser hypermetabolic activity is seen within right hilar nodes (SUV max 4.9) and subcarinal nodes (SUV max 4.1). No left hilar or axillary adenopathy. The previously demonstrated dominant right lung mass at the confluence of the fissures has decreased in size, now measuring approximately 2.7 x 2.1 cm on image 75/4. This is mildly hypermetabolic with an SUV max of 4.9 and could reflect loculated fluid in the fissure. A nodule in the right upper lobe has decreased in size, now measuring approximately 0.7 cm on image 54/4 (SUV max 5.0). No other significant hypermetabolic activity within the right pleural space. No hypermetabolic pulmonary activity or residual suspicious nodularity in the left lung. Incidental CT findings: Moderate centrilobular and paraseptal emphysema. Small partially loculated right pleural effusion is unchanged in overall volume. There is interval improved aeration of the right upper and middle lobes. Small pericardial effusion has decreased in volume. Atherosclerosis of the aorta, great vessels and coronary arteries noted. ABDOMEN/PELVIS: There is no hypermetabolic activity within the liver, adrenal glands, spleen or pancreas. There is no hypermetabolic nodal activity in the abdomen or pelvis. Incidental CT findings: Low-density lesions within the right and left hepatic lobe demonstrate no metabolic activity and are likely cysts.  Diffuse aortic and branch vessel atherosclerosis. Mildly prominent stool throughout the colon. SKELETON: Fairly homogeneous diffusely increased metabolic activity is demonstrated within the axial and proximal appendicular skeleton. No corresponding focal lytic or blastic lesions are seen, and this is presumably treatment related and/or related to the patient's HIV infection. Incidental CT findings: Moderate facet hypertrophy in the lower lumbar spine: IMPRESSION: 1. The dominant right paratracheal nodal mass has slightly decreased in size compared with the recent chest CT of 09/01/2022, and is intensely hypermetabolic, consistent with malignancy. Additional smaller hypermetabolic right hilar and subcarinal lymph nodes are nonspecific although likely nodal metastases. 2. Hypermetabolic right lung nodules as described, slightly decreased in size from previous chest CT. The largest component along the confluence of the fissures may in part be secondary to loculated pleural fluid. Interval improved aeration of  the right upper and middle lobes. 3. No evidence of metastatic disease within the abdomen or pelvis. 4. Diffuse increased metabolic activity throughout the axial and proximal appendicular skeleton, likely treatment related and/or related to the patient's HIV infection. No focal lytic or blastic osseous lesions identified. 5. Aortic Atherosclerosis (ICD10-I70.0) and Emphysema (ICD10-J43.9). Electronically Signed   By: Carey Bullocks M.D.   On: 09/25/2022 10:10   MR BRAIN W WO CONTRAST  Result Date: 09/19/2022 CLINICAL DATA:  Non-small cell lung cancer (NSCLC), stagingz EXAM: MRI HEAD WITHOUT AND WITH CONTRAST TECHNIQUE: Multiplanar, multiecho pulse sequences of the brain and surrounding structures were obtained without and with intravenous contrast. CONTRAST:  6mL GADAVIST GADOBUTROL 1 MMOL/ML IV SOLN COMPARISON:  None Available. FINDINGS: Brain: Expansile T2/FLAIR hyperintensity within the right frontal  cortex, anterior and superior temporal cortex and adjacent frontal white matter with a few areas of associated curvilinear cortical enhancement inferiorly (for example see series 20, image 86). Additionally, there is a punctate focus of enhancement more superiorly in the right frontal cortex (series 20, image 117). Some areas of mild associated patchy DWI hyperintensity with possible mild curvilinear areas of SWI along the frontoparietal cortex central ADC correlate. No evidence of acute mass occupying hemorrhage, midline shift, or hydrocephalus. Vascular: Major arterial flow voids are maintained skull base. Skull and upper cervical spine: Normal marrow signal. Sinuses/Orbits: Inferior left maxillary sinus retention cyst versus polyp. Otherwise, largely clear sinuses. No acute orbital findings. Other: No sizable mastoid effusions. IMPRESSION: Expansile T2/FLAIR hyperintensity in the right frontal cortex with some areas of associated restricted diffusion and curvilinear enhancement. The appearance is most suggestive of evolving subacute enhancing infarct; however, given the expansile appearance a close interval follow-up MRI with contrast in approximately 4-6 weeks is recommended to ensure expected evolution and help exclude glioma and/or metastatic disease. These results will be called to the ordering clinician or representative by the Radiologist Assistant, and communication documented in the PACS or [employer]. Electronically Signed   By: Feliberto Harts M.D.   On: 09/19/2022 11:39     Assessment/Plan Abnormal brain MRI - Plan: MR BRAIN W WO CONTRAST  ZUNAIRAH DEVERS presents with radiographic syndrome localizing to the right frontal lobe.  There is no focality objectively or subjectively.    Etiology is unclear; stroke, metastasis are on differential, respectively.  However, there is some mass effect visible on FLAIR, and pattern of diffusion restriction is a little atypical for stroke.   We  recommended repeating brain MRI in 1 month for further characterization.  She is agreeable with this.  In the meantime, she should continue daily full dose Aspirin for secondary prevention, pending further workup.  Ok with PRN baclofen 10mg  for leg spasms.  We spent twenty additional minutes teaching regarding the natural history, biology, and historical experience in the treatment of neurologic complications of cancer.   We appreciate the opportunity to participate in the care of ABAGAYLE KLUTTS.   We ask that LYDIANA MILLEY return to clinic in 1 months following next brain MRI, or sooner as needed.  All questions were answered. The patient knows to call the clinic with any problems, questions or concerns. No barriers to learning were detected.  The total time spent in the encounter was 40 minutes and more than 50% was on counseling and review of test results   Henreitta Leber, MD Medical Director of Neuro-Oncology [facility] 10/11/22 2:07 PM

## 2022-10-13 ENCOUNTER — Other Ambulatory Visit: Payer: Self-pay

## 2022-10-14 ENCOUNTER — Telehealth: Payer: Self-pay | Admitting: Medical Oncology

## 2022-10-14 NOTE — Telephone Encounter (Signed)
She missed a call . I told her to keep her appt next week.

## 2022-10-17 NOTE — Progress Notes (Signed)
[facility] OFFICE PROGRESS NOTE  [name], MD [address] [place] [address]  DIAGNOSIS:  1) Extensive stage small cell lung cancer (T3, N3, M1 C) . She presented with large right lower lobe lung masses in addition to bulky right hilar and mediastinal lymphadenopathy as well as suspicious lytic bone lesion at L1. She was diagnosed in March 2024.  2) HIV diagnosed in March 2024  PRIOR THERAPY: None  CURRENT THERAPY: Systemic chemotherapy with carboplatin for AUC of 4 on day 1 and etoposide 80 Mg/M2 on days 1, 2 and 3 with Imfinzi 1500 Mg on day 1 every 3 weeks with Neulasta support on day 5. First dose September 14, 2022 . She is on dose reduced chemotherapy due to her HIV. Status post 2 cycles  INTERVAL HISTORY: [name] 67 y.o. female returns to the clinic today for a follow-up visit.  The patient was recently diagnosed with extensive stage small cell lung cancer.  She underwent her first two cycle of chemotherapy and tolerated it well except for fatigue. She did call last week with the chief complaint of joint pain and she has a prescription for percocet.   Otherwise, the patient does not have any complaints today. She denies any fever, chills, night sweats, or unexplained weight loss. She has been eating better recently. She denies any shortness of breath unless she is out in public for several hours and exerting herself. Of she over exerts, she will put on her oxygen. She reports her breathing is about the same at this time.  She denies any cough or hemoptysis.  Denies any nausea, vomiting, or constipation. Her diarrhea has "slowed down". She was able to pick up her lomotil which has helped. She had some diarrhea prior to starting chemotherapy. Her C diff and GI pathogen panel were negative. Denies any headache or visual changes.  The rash on her face improved. She occasionally experience diarrhea which had started prior to chemotherapy and have occurred  intermittently. She also had her staging brain MRI that showed possible infarct vs metastatic lesion. She saw Dr. Barbaraann Cao who recommended follow up brain MRI in 1 month which is scheduled for 5/31. She is here for evaluation and repeat blood work before starting cycle #3.   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  MEDICATIONS:  Current Outpatient Medications  Medication Sig Dispense Refill   Aspirin 325 MG CAPS Take 325 mg by mouth daily.     baclofen (LIORESAL) 10 MG tablet Take 1 tablet (10 mg total) by mouth 3 (three) times daily as needed for muscle spasms. 30 each 0   diphenoxylate-atropine (LOMOTIL) 2.5-0.025 MG tablet Take 1 tablet by mouth 4 (four) times daily as needed for diarrhea or loose stools. 30 tablet 0   dolutegravir-lamiVUDine (DOVATO) 50-300 MG tablet Take 1 tablet by mouth daily. 30 tablet 11   folic acid (FOLVITE) 1 MG tablet Take 1 tablet (1 mg total) by mouth daily. 90 tablet 0   guaifenesin (ROBITUSSIN) 100 MG/5ML syrup Take 200 mg by mouth 3 (three) times daily as needed for cough. (Patient not taking: Reported on 09/21/2022)     lidocaine-prilocaine (EMLA) cream Apply 1 Application topically as  needed. 30 g 2   nicotine (NICODERM CQ) 7 mg/24hr patch Place 1 patch (7 mg total) onto the skin daily. 28 patch 0   nicotine polacrilex (NICORETTE) 4 MG gum Take 1 each (4 mg total) by mouth as needed for smoking cessation. 30 tablet 2   oxyCODONE-acetaminophen (PERCOCET/ROXICET) 5-325 MG tablet Take 1 tablet by mouth every 8 (eight) hours as needed for severe pain. 30 tablet 0   prochlorperazine (COMPAZINE) 10 MG tablet Take 1 tablet (10 mg total) by mouth every 6 (six) hours as needed. (Patient not taking: Reported on 09/14/2022)  30 tablet 2   thiamine (VITAMIN B-1) 100 MG tablet Take 1 tablet (100 mg total) by mouth daily. (Patient not taking: Reported on 09/21/2022) 90 tablet 0   No current facility-administered medications for this visit.   Facility-Administered Medications Ordered in Other Visits  Medication Dose Route Frequency Provider Last Rate Last Admin   CARBOplatin (PARAPLATIN) 320 mg in sodium chloride 0.9 % 100 mL chemo infusion  320 mg Intravenous Once Si Gaul, MD       dexamethasone (DECADRON) 10 mg in sodium chloride 0.9 % 50 mL IVPB  10 mg Intravenous Once Si Gaul, MD 204 mL/hr at 10/25/22 1245 10 mg at 10/25/22 1245   durvalumab (IMFINZI) 1,500 mg in sodium chloride 0.9 % 100 mL chemo infusion  1,500 mg Intravenous Once Si Gaul, MD       etoposide (VEPESID) 138 mg in sodium chloride 0.9 % 500 mL chemo infusion  80 mg/m2 (Treatment Plan Recorded) Intravenous Once Si Gaul, MD       fosaprepitant (EMEND) 150 mg in sodium chloride 0.9 % 145 mL IVPB  150 mg Intravenous Once Si Gaul, MD 450 mL/hr at 10/25/22 1244 150 mg at 10/25/22 1244   heparin lock flush 100 unit/mL  500 Units Intracatheter Once PRN Si Gaul, MD       sodium chloride flush (NS) 0.9 % injection 10 mL  10 mL Intracatheter PRN Si Gaul, MD        SURGICAL HISTORY:  Past Surgical History:  Procedure Laterality Date   BRONCHIAL NEEDLE ASPIRATION BIOPSY  09/02/2022   Procedure: BRONCHIAL NEEDLE ASPIRATION BIOPSIES;  Surgeon: Josephine Igo, DO;  Location: [facility];  Service: Cardiopulmonary;;   IR IMAGING GUIDED PORT INSERTION  09/29/2022   MULTIPLE TOOTH EXTRACTIONS     ORIF ANKLE FRACTURE  05/03/2012   Procedure: OPEN REDUCTION INTERNAL FIXATION (ORIF) ANKLE FRACTURE;  Surgeon: Toni Arthurs, MD;  Location: [facility] OR;  Service: Orthopedics;  Laterality: Left;   VIDEO BRONCHOSCOPY WITH ENDOBRONCHIAL ULTRASOUND Bilateral 09/02/2022   Procedure: VIDEO BRONCHOSCOPY WITH ENDOBRONCHIAL  ULTRASOUND;  Surgeon: Josephine Igo, DO;  Location: [facility];  Service: Cardiopulmonary;  Laterality: Bilateral;    REVIEW OF SYSTEMS:   Constitutional: Negative for appetite change, chills, fatigue, fever and unexpected weight change.  HENT: Negative for mouth sores, nosebleeds, sore throat and trouble swallowing.   Eyes: Negative for eye problems and icterus.  Respiratory: Stable mild dyspnea on exertion. Negative for cough, hemoptysis, and wheezing.   Cardiovascular: Negative for chest pain and leg swelling.  Gastrointestinal: Positive for improving diarrhea. Negative for abdominal pain, constipation, nausea and vomiting.  Genitourinary: Negative for bladder incontinence, difficulty urinating, dysuria, frequency and hematuria.   Musculoskeletal: Negative for back pain, gait problem, neck pain and neck stiffness.  Skin: Positive for dry skin on face.  Negative for rash. Neurological: Negative for dizziness, extremity weakness, gait problem, headaches, light-headedness and seizures.  Hematological: Negative for adenopathy. Does not bruise/bleed easily.  Psychiatric/Behavioral: Negative for confusion, depression and sleep disturbance. The patient is not nervous/anxious.      PHYSICAL EXAMINATION:  Blood pressure 119/70, pulse 96, temperature 98.3 F (36.8 C), temperature source Oral, weight 134 lb (60.8 kg), SpO2 100 %.  ECOG PERFORMANCE STATUS: 1  Physical Exam  onstitutional: Oriented to person, place, and time and thin appearing female and in no distress.   HENT:  Head: Normocephalic and atraumatic.  Mouth/Throat: Oropharynx is clear and moist. No oropharyngeal exudate.  Eyes: Conjunctivae are normal. Right eye exhibits no discharge. Left eye exhibits no discharge. No scleral icterus.  Neck: Normal range of motion. Neck supple.  Cardiovascular: Normal rate, regular rhythm, normal heart sounds and intact distal pulses.   Pulmonary/Chest: Effort normal and breath sounds  normal. No respiratory distress. No wheezes. No rales.  Abdominal: Soft. Bowel sounds are normal. Exhibits no distension and no mass. There is no tenderness.  Musculoskeletal: Normal range of motion. Exhibits no edema.  Lymphadenopathy:    No cervical adenopathy.  Neurological: Alert and oriented to person, place, and time. Exhibits muscle wasting. Gait normal. Coordination normal.  Skin: Skin is warm and dry. No rash noted. Not diaphoretic. No erythema. No pallor.  Psychiatric: Mood, memory and judgment normal.  Vitals reviewed.  LABORATORY DATA: Lab Results  Component Value Date   WBC 5.0 10/25/2022   HGB 9.8 (L) 10/25/2022   HCT 30.6 (L) 10/25/2022   MCV 99.7 10/25/2022   PLT 282 10/25/2022      Chemistry      Component Value Date/Time   NA 138 10/25/2022 1058   K 4.1 10/25/2022 1058   CL 109 10/25/2022 1058   CO2 26 10/25/2022 1058   BUN 22 10/25/2022 1058   CREATININE 0.87 10/25/2022 1058   CREATININE 0.53 09/21/2022 0942      Component Value Date/Time   CALCIUM 9.3 10/25/2022 1058   ALKPHOS 66 10/25/2022 1058   AST 13 (L) 10/25/2022 1058   ALT 11 10/25/2022 1058   BILITOT 0.2 (L) 10/25/2022 1058       RADIOGRAPHIC STUDIES:  IR IMAGING GUIDED PORT INSERTION  Result Date: 09/29/2022 INDICATION: RIGHT lung mass. EXAM: IMPLANTED PORT A CATH PLACEMENT WITH ULTRASOUND AND FLUOROSCOPIC GUIDANCE MEDICATIONS: None ANESTHESIA/SEDATION: Local anesthetic and single agent sedation was employed during this procedure. A total of fentanyl 100 mcg was administered intravenously. The patient's level of consciousness and vital signs were monitored continuously by radiology nursing throughout the procedure under my direct supervision. FLUOROSCOPY TIME:  Fluoroscopic dose; 0 mGy COMPLICATIONS: None immediate. PROCEDURE: The procedure, risks, benefits, and alternatives were explained to the patient. Questions regarding the procedure were encouraged and answered. The patient understands  and consents to the procedure. The LEFT neck and chest were prepped with chlorhexidine in a sterile fashion, and a sterile drape was applied covering the operative field. Maximum barrier sterile technique with sterile gowns and gloves were used for the procedure. A timeout was performed prior to the initiation of the procedure. Local anesthesia was provided with 1% lidocaine with epinephrine. After creating a small venotomy incision, a micropuncture kit was utilized to access the internal jugular vein under direct, real-time ultrasound guidance. Ultrasound image documentation was performed. The microwire was kinked to measure appropriate catheter length. A subcutaneous port pocket was then created along the upper chest wall utilizing a combination of sharp and blunt dissection. The pocket was irrigated with sterile saline. A single lumen Non-ISP power injectable  port was chosen for placement. The 8 Fr catheter was tunneled from the port pocket site to the venotomy incision. The port was placed in the pocket. The external catheter was trimmed to appropriate length. At the venotomy, an 8 Fr peel-away sheath was placed over a guidewire under fluoroscopic guidance. The catheter was then placed through the sheath and the sheath was removed. Final catheter positioning was confirmed and documented with a fluoroscopic spot radiograph. The port was accessed with a Huber needle, aspirated and flushed with heparinized saline. The port pocket incision was closed with interrupted 3-0 Vicryl suture then Dermabond was applied, including at the venotomy incision. Dressings were placed. The patient tolerated the procedure well without immediate post procedural complication. IMPRESSION: Successful placement of a LEFT internal jugular approach power injectable Port-A-Cath. The tip of the catheter is positioned at the superior cavo-atrial junction. The catheter is ready for immediate use. Roanna Banning, MD Vascular and Interventional  [facility] Radiology Electronically Signed   By: Roanna Banning M.D.   On: 09/29/2022 15:46     ASSESSMENT/PLAN:  This is a very pleasant 67 year old female with extensive stage small cell lung cancer (T3, N3, M1 C) . She presented with large right lower lobe lung masses in addition to bulky right hilar and mediastinal lymphadenopathy as well as suspicious lytic bone lesion at L1. She was diagnosed in March 2024.    She is currently on systemic chemotherapy with carboplatin for AUC of 4 on day 1 and etoposide 80 Mg/M2 on days 1, 2 and 3 with Neulasta support on day 5 as well as Imfinzi 1500 Mg IV on day 1 every 3 weeks.  With starting with the reduced dose of carboplatin and etoposide side because of her HIV status. She is status post 2 cycles and tolerated it well.    Labs were reviewed. Recommend proceed she with cycle #3 today as scheduled.    We will arrange for a restaging CT scan prior to her next appointment.    We will see her back for a follow up visit in 3 weeks for evalation and repeat blood work before starting cycle #4  She will follow with Dr. Barbaraann Cao regarding her abnormal brain MRI. He saw her recently and recommended 1 month follow up brain MRI which is scheduled for 11/11/22.   She will continue to use lomotil and imodium if needed for diarrhea, which has improved at this time.   She will continue to use the percocet for her cancer related pain.   We will continue to monitor her labs weekly. She will have a sample to blood bank hold for her lab visits in the event she needs a blood transfusion which we would consider if Hbg <8.   The patient was advised to call immediately if she has any concerning symptoms in the interval. The patient voices understanding of current disease status and treatment options and is in agreement with the current care plan. All questions were answered. The patient knows to call the clinic with any problems, questions or concerns.  We can certainly see the patient much sooner if necessary        Orders Placed This Encounter  Procedures   CT Chest W Contrast    Standing Status:   Future    Standing Expiration Date:   10/25/2023    Order Specific Question:   If indicated for the ordered procedure, I authorize the administration of contrast media per Radiology protocol    Answer:  Yes    Order Specific Question:   Does the patient have a contrast media/X-ray dye allergy?    Answer:   No    Order Specific Question:   Preferred imaging location?    Answer:   [facility]   CT Abdomen Pelvis W Contrast    Standing Status:   Future    Standing Expiration Date:   10/25/2023    Order Specific Question:   If indicated for the ordered procedure, I authorize the administration of contrast media per Radiology protocol    Answer:   Yes    Order Specific Question:   Does the patient have a contrast media/X-ray dye allergy?    Answer:   No    Order Specific Question:   Preferred imaging location?    Answer:   [facility]    Order Specific Question:   If indicated for the ordered procedure, I authorize the administration of oral contrast media per Radiology protocol    Answer:   Yes   CBC with Differential (Cancer Center Only)    Standing Status:   Future    Standing Expiration Date:   11/15/2023   CMP (Cancer Center only)    Standing Status:   Future    Standing Expiration Date:   11/15/2023   CBC with Differential (Cancer Center Only)    Standing Status:   Future    Standing Expiration Date:   12/06/2023   CMP (Cancer Center only)    Standing Status:   Future    Standing Expiration Date:   12/06/2023   CBC with Differential (Cancer Center Only)    Standing Status:   Future    Standing Expiration Date:   01/03/2024   CMP (Cancer Center only)    Standing Status:   Future    Standing Expiration Date:   01/03/2024   T4    Standing Status:   Future    Standing Expiration Date:   01/03/2024   TSH     Standing Status:   Future    Standing Expiration Date:   01/03/2024     The total time spent in the appointment was 20-29 minutes.   Nasier Thumm L Kaeleigh Westendorf, PA-C 10/25/22

## 2022-10-18 ENCOUNTER — Telehealth: Payer: Self-pay | Admitting: Medical Oncology

## 2022-10-18 ENCOUNTER — Inpatient Hospital Stay: Payer: Medicare HMO | Attending: Internal Medicine

## 2022-10-18 ENCOUNTER — Other Ambulatory Visit: Payer: Self-pay

## 2022-10-18 ENCOUNTER — Encounter: Payer: Self-pay | Admitting: Internal Medicine

## 2022-10-18 ENCOUNTER — Telehealth: Payer: Self-pay | Admitting: *Deleted

## 2022-10-18 ENCOUNTER — Other Ambulatory Visit: Payer: 59

## 2022-10-18 ENCOUNTER — Inpatient Hospital Stay: Payer: Medicare HMO

## 2022-10-18 ENCOUNTER — Telehealth: Payer: Self-pay | Admitting: Internal Medicine

## 2022-10-18 ENCOUNTER — Other Ambulatory Visit: Payer: Self-pay | Admitting: Internal Medicine

## 2022-10-18 DIAGNOSIS — Z79630 Long term (current) use of alkylating agent: Secondary | ICD-10-CM | POA: Insufficient documentation

## 2022-10-18 DIAGNOSIS — G893 Neoplasm related pain (acute) (chronic): Secondary | ICD-10-CM | POA: Diagnosis not present

## 2022-10-18 DIAGNOSIS — Z79620 Long term (current) use of immunosuppressive biologic: Secondary | ICD-10-CM | POA: Insufficient documentation

## 2022-10-18 DIAGNOSIS — C3490 Malignant neoplasm of unspecified part of unspecified bronchus or lung: Secondary | ICD-10-CM

## 2022-10-18 DIAGNOSIS — C3431 Malignant neoplasm of lower lobe, right bronchus or lung: Secondary | ICD-10-CM | POA: Diagnosis not present

## 2022-10-18 DIAGNOSIS — Z5111 Encounter for antineoplastic chemotherapy: Secondary | ICD-10-CM | POA: Diagnosis not present

## 2022-10-18 DIAGNOSIS — Z79634 Long term (current) use of topoisomerase inhibitor: Secondary | ICD-10-CM | POA: Diagnosis not present

## 2022-10-18 DIAGNOSIS — B20 Human immunodeficiency virus [HIV] disease: Secondary | ICD-10-CM | POA: Diagnosis not present

## 2022-10-18 DIAGNOSIS — Z5112 Encounter for antineoplastic immunotherapy: Secondary | ICD-10-CM | POA: Insufficient documentation

## 2022-10-18 DIAGNOSIS — Z95828 Presence of other vascular implants and grafts: Secondary | ICD-10-CM

## 2022-10-18 LAB — CBC WITH DIFFERENTIAL (CANCER CENTER ONLY)
Abs Immature Granulocytes: 0.07 10*3/uL (ref 0.00–0.07)
Basophils Absolute: 0.1 10*3/uL (ref 0.0–0.1)
Basophils Relative: 1 %
Eosinophils Absolute: 0.2 10*3/uL (ref 0.0–0.5)
Eosinophils Relative: 2 %
HCT: 29.8 % — ABNORMAL LOW (ref 36.0–46.0)
Hemoglobin: 9.5 g/dL — ABNORMAL LOW (ref 12.0–15.0)
Immature Granulocytes: 1 %
Lymphocytes Relative: 13 %
Lymphs Abs: 1.1 10*3/uL (ref 0.7–4.0)
MCH: 31.4 pg (ref 26.0–34.0)
MCHC: 31.9 g/dL (ref 30.0–36.0)
MCV: 98.3 fL (ref 80.0–100.0)
Monocytes Absolute: 0.5 10*3/uL (ref 0.1–1.0)
Monocytes Relative: 6 %
Neutro Abs: 6.8 10*3/uL (ref 1.7–7.7)
Neutrophils Relative %: 77 %
Platelet Count: 280 10*3/uL (ref 150–400)
RBC: 3.03 MIL/uL — ABNORMAL LOW (ref 3.87–5.11)
RDW: 18.2 % — ABNORMAL HIGH (ref 11.5–15.5)
WBC Count: 8.8 10*3/uL (ref 4.0–10.5)
nRBC: 0 % (ref 0.0–0.2)

## 2022-10-18 LAB — CMP (CANCER CENTER ONLY)
ALT: 15 U/L (ref 0–44)
AST: 16 U/L (ref 15–41)
Albumin: 3.3 g/dL — ABNORMAL LOW (ref 3.5–5.0)
Alkaline Phosphatase: 92 U/L (ref 38–126)
Anion gap: 4 — ABNORMAL LOW (ref 5–15)
BUN: 9 mg/dL (ref 8–23)
CO2: 25 mmol/L (ref 22–32)
Calcium: 9 mg/dL (ref 8.9–10.3)
Chloride: 106 mmol/L (ref 98–111)
Creatinine: 0.62 mg/dL (ref 0.44–1.00)
GFR, Estimated: >60 mL/min (ref >60)
Glucose, Bld: 100 mg/dL — ABNORMAL HIGH (ref 70–99)
Potassium: 3.7 mmol/L (ref 3.5–5.1)
Sodium: 135 mmol/L (ref 135–145)
Total Bilirubin: 0.2 mg/dL — ABNORMAL LOW (ref 0.3–1.2)
Total Protein: 8.3 g/dL — ABNORMAL HIGH (ref 6.5–8.1)

## 2022-10-18 LAB — SAMPLE TO BLOOD BANK

## 2022-10-18 MED ORDER — OXYCODONE-ACETAMINOPHEN 5-325 MG PO TABS: 1.0000 | ORAL_TABLET | Freq: Three times a day (TID) | ORAL | 0 refills | Status: DC | PRN

## 2022-10-18 MED ORDER — HEPARIN SOD (PORK) LOCK FLUSH 100 UNIT/ML IV SOLN
500.0000 [IU] | Freq: Once | INTRAVENOUS | Status: AC
  Administered 2022-10-18: 500 [IU]

## 2022-10-18 MED ORDER — SODIUM CHLORIDE 0.9% FLUSH
10.0000 mL | Freq: Once | INTRAVENOUS | Status: AC
  Administered 2022-10-18: 10 mL

## 2022-10-18 NOTE — Telephone Encounter (Signed)
-----   Message from Si Gaul, MD sent at 10/18/2022  4:22 PM EDT ----- Regarding: RE: Pain Okay.  I will send her some Percocet.  Thank you ----- Message ----- From: Arville Care, RN Sent: 10/18/2022   2:00 PM EDT To: Si Gaul, MD; Charma Igo, RN Subject: Pain                                           This patient called Dr Liana Gerold office, she is C/O severe pain in her legs, lower back, L jaw, also abdominal pain.  She has been taking ES Tylenol but says this is not helping.  This pain started two nights ago & she is asking for something stronger for pain.  She denies any fever or vomiting.  She does have some diarrhea which is controlled by her lomotil.  Thanks, Darel Hong

## 2022-10-18 NOTE — Telephone Encounter (Signed)
Pt.notified

## 2022-10-18 NOTE — Telephone Encounter (Signed)
See documentation below

## 2022-10-18 NOTE — Telephone Encounter (Signed)
-----   Message from Mohamed Mohamed, MD sent at 10/18/2022  4:22 PM EDT ----- Regarding: RE: Pain Okay.  I will send her some Percocet.  Thank you ----- Message ----- From: Lowe, Judith W, RN Sent: 10/18/2022   2:00 PM EDT To: Mohamed Mohamed, MD; Purva Vessell H Tamma Brigandi, RN Subject: Pain                                           This patient called Dr Vaslow's office, she is C/O severe pain in her legs, lower back, L jaw, also abdominal pain.  She has been taking ES Tylenol but says this is not helping.  This pain started two nights ago & she is asking for something stronger for pain.  She denies any fever or vomiting.  She does have some diarrhea which is controlled by her lomotil.  Thanks, Judy   

## 2022-10-18 NOTE — Telephone Encounter (Signed)
Called patient regarding upcoming May and June appointments, patient is notified. ?

## 2022-10-18 NOTE — Progress Notes (Signed)
Received call from patient after receiving my card per my request.  Introduced myself as Dance movement psychotherapist and to offer available resources.  Discussed one-time $1000 Marketing executive to assist with personal expenses while going through treatment. Advised what is needed to apply and she will bring on 5/14 at next visit and provide to registration staff to be scanned and emailed to me. She will then be given grant paperwork to complete and call me at her earliest convenience to discuss expense sheet in detail. Briefly discussed gift cards and how they are disbursed.  She has my card for any additional financial questions or concerns.

## 2022-10-18 NOTE — Progress Notes (Signed)
Pt. C/O pain under her port and a raised area to her left arm that appear this morning with swelling.  States the swelling has now decreased since this morning.  Kate/PA informed and came to assess the pt.  Ok to access port.  Port accessed with no issues or complaints voiced.

## 2022-10-19 ENCOUNTER — Telehealth: Payer: Self-pay

## 2022-10-19 NOTE — Telephone Encounter (Signed)
Called patient to schedule follow up appointment, call could not be completed.   Whitfield Dulay D Eulice Rutledge, RN  

## 2022-10-20 ENCOUNTER — Other Ambulatory Visit (HOSPITAL_COMMUNITY): Payer: Self-pay

## 2022-10-20 ENCOUNTER — Other Ambulatory Visit: Payer: Self-pay

## 2022-10-20 DIAGNOSIS — C3490 Malignant neoplasm of unspecified part of unspecified bronchus or lung: Secondary | ICD-10-CM

## 2022-10-20 MED ORDER — LIDOCAINE-PRILOCAINE 2.5-2.5 % EX CREA
1.0000 | TOPICAL_CREAM | CUTANEOUS | 2 refills | Status: DC | PRN
Start: 2022-10-20 — End: 2023-07-18
  Filled 2022-10-20: qty 30, 30d supply, fill #0

## 2022-10-21 ENCOUNTER — Telehealth: Payer: Self-pay | Admitting: Medical Oncology

## 2022-10-21 ENCOUNTER — Other Ambulatory Visit (HOSPITAL_COMMUNITY): Payer: Self-pay

## 2022-10-21 NOTE — Telephone Encounter (Signed)
Pharmacy change- Use [facility]. Pt very angry that her pain med went to [facility] because of her lack of transportation and the cost.  Send all  prescriptions to [facility] because they can mail it to her at no cost.  Today , her niece , Leonides Sake,  will pick up and pay for oxycodone at [facility]. Marland Kitchen

## 2022-10-24 MED FILL — Fosaprepitant Dimeglumine For IV Infusion 150 MG (Base Eq): INTRAVENOUS | Qty: 5 | Status: AC

## 2022-10-24 MED FILL — Dexamethasone Sodium Phosphate Inj 100 MG/10ML: INTRAMUSCULAR | Qty: 1 | Status: AC

## 2022-10-25 ENCOUNTER — Encounter: Payer: Self-pay | Admitting: Internal Medicine

## 2022-10-25 ENCOUNTER — Other Ambulatory Visit: Payer: Self-pay

## 2022-10-25 ENCOUNTER — Inpatient Hospital Stay: Payer: Medicare HMO

## 2022-10-25 ENCOUNTER — Inpatient Hospital Stay (HOSPITAL_BASED_OUTPATIENT_CLINIC_OR_DEPARTMENT_OTHER): Payer: Medicare HMO | Admitting: Physician Assistant

## 2022-10-25 ENCOUNTER — Other Ambulatory Visit: Payer: 59

## 2022-10-25 ENCOUNTER — Inpatient Hospital Stay: Payer: Medicare HMO | Admitting: Nutrition

## 2022-10-25 VITALS — BP 119/70 | HR 96 | Temp 98.3°F | Wt 134.0 lb

## 2022-10-25 VITALS — BP 129/88 | HR 80 | Resp 19

## 2022-10-25 DIAGNOSIS — C3490 Malignant neoplasm of unspecified part of unspecified bronchus or lung: Secondary | ICD-10-CM | POA: Diagnosis not present

## 2022-10-25 DIAGNOSIS — Z5112 Encounter for antineoplastic immunotherapy: Secondary | ICD-10-CM | POA: Diagnosis not present

## 2022-10-25 DIAGNOSIS — Z95828 Presence of other vascular implants and grafts: Secondary | ICD-10-CM

## 2022-10-25 DIAGNOSIS — C3431 Malignant neoplasm of lower lobe, right bronchus or lung: Secondary | ICD-10-CM | POA: Diagnosis not present

## 2022-10-25 DIAGNOSIS — Z79630 Long term (current) use of alkylating agent: Secondary | ICD-10-CM | POA: Diagnosis not present

## 2022-10-25 DIAGNOSIS — B20 Human immunodeficiency virus [HIV] disease: Secondary | ICD-10-CM | POA: Diagnosis not present

## 2022-10-25 DIAGNOSIS — G893 Neoplasm related pain (acute) (chronic): Secondary | ICD-10-CM | POA: Diagnosis not present

## 2022-10-25 DIAGNOSIS — Z79634 Long term (current) use of topoisomerase inhibitor: Secondary | ICD-10-CM | POA: Diagnosis not present

## 2022-10-25 DIAGNOSIS — Z79620 Long term (current) use of immunosuppressive biologic: Secondary | ICD-10-CM | POA: Diagnosis not present

## 2022-10-25 DIAGNOSIS — Z5111 Encounter for antineoplastic chemotherapy: Secondary | ICD-10-CM | POA: Diagnosis not present

## 2022-10-25 LAB — CMP (CANCER CENTER ONLY)
ALT: 11 U/L (ref 0–44)
AST: 13 U/L — ABNORMAL LOW (ref 15–41)
Albumin: 3.6 g/dL (ref 3.5–5.0)
Alkaline Phosphatase: 66 U/L (ref 38–126)
Anion gap: 3 — ABNORMAL LOW (ref 5–15)
BUN: 22 mg/dL (ref 8–23)
CO2: 26 mmol/L (ref 22–32)
Calcium: 9.3 mg/dL (ref 8.9–10.3)
Chloride: 109 mmol/L (ref 98–111)
Creatinine: 0.87 mg/dL (ref 0.44–1.00)
GFR, Estimated: >60 mL/min (ref >60)
Glucose, Bld: 107 mg/dL — ABNORMAL HIGH (ref 70–99)
Potassium: 4.1 mmol/L (ref 3.5–5.1)
Sodium: 138 mmol/L (ref 135–145)
Total Bilirubin: 0.2 mg/dL — ABNORMAL LOW (ref 0.3–1.2)
Total Protein: 8.7 g/dL — ABNORMAL HIGH (ref 6.5–8.1)

## 2022-10-25 LAB — CBC WITH DIFFERENTIAL (CANCER CENTER ONLY)
Abs Immature Granulocytes: 0.02 10*3/uL (ref 0.00–0.07)
Basophils Absolute: 0 10*3/uL (ref 0.0–0.1)
Basophils Relative: 1 %
Eosinophils Absolute: 0.1 10*3/uL (ref 0.0–0.5)
Eosinophils Relative: 3 %
HCT: 30.6 % — ABNORMAL LOW (ref 36.0–46.0)
Hemoglobin: 9.8 g/dL — ABNORMAL LOW (ref 12.0–15.0)
Immature Granulocytes: 0 %
Lymphocytes Relative: 24 %
Lymphs Abs: 1.2 10*3/uL (ref 0.7–4.0)
MCH: 31.9 pg (ref 26.0–34.0)
MCHC: 32 g/dL (ref 30.0–36.0)
MCV: 99.7 fL (ref 80.0–100.0)
Monocytes Absolute: 0.5 10*3/uL (ref 0.1–1.0)
Monocytes Relative: 10 %
Neutro Abs: 3.1 10*3/uL (ref 1.7–7.7)
Neutrophils Relative %: 62 %
Platelet Count: 282 10*3/uL (ref 150–400)
RBC: 3.07 MIL/uL — ABNORMAL LOW (ref 3.87–5.11)
RDW: 18.6 % — ABNORMAL HIGH (ref 11.5–15.5)
WBC Count: 5 10*3/uL (ref 4.0–10.5)
nRBC: 0 % (ref 0.0–0.2)

## 2022-10-25 LAB — SAMPLE TO BLOOD BANK

## 2022-10-25 LAB — TSH: TSH: 1.302 u[IU]/mL (ref 0.350–4.500)

## 2022-10-25 MED ORDER — SODIUM CHLORIDE 0.9 % IV SOLN
316.0000 mg | Freq: Once | INTRAVENOUS | Status: AC
  Administered 2022-10-25: 320 mg via INTRAVENOUS
  Filled 2022-10-25: qty 32

## 2022-10-25 MED ORDER — SODIUM CHLORIDE 0.9 % IV SOLN
150.0000 mg | Freq: Once | INTRAVENOUS | Status: AC
  Administered 2022-10-25: 150 mg via INTRAVENOUS
  Filled 2022-10-25: qty 150

## 2022-10-25 MED ORDER — SODIUM CHLORIDE 0.9 % IV SOLN: Freq: Once | INTRAVENOUS | Status: AC

## 2022-10-25 MED ORDER — SODIUM CHLORIDE 0.9% FLUSH
10.0000 mL | INTRAVENOUS | Status: DC | PRN
  Administered 2022-10-25: 10 mL

## 2022-10-25 MED ORDER — PALONOSETRON HCL INJECTION 0.25 MG/5ML
0.2500 mg | Freq: Once | INTRAVENOUS | Status: AC
  Administered 2022-10-25: 0.25 mg via INTRAVENOUS
  Filled 2022-10-25: qty 5

## 2022-10-25 MED ORDER — SODIUM CHLORIDE 0.9 % IV SOLN
10.0000 mg | Freq: Once | INTRAVENOUS | Status: AC
  Administered 2022-10-25: 10 mg via INTRAVENOUS
  Filled 2022-10-25: qty 10

## 2022-10-25 MED ORDER — SODIUM CHLORIDE 0.9% FLUSH
10.0000 mL | Freq: Once | INTRAVENOUS | Status: AC
  Administered 2022-10-25: 10 mL

## 2022-10-25 MED ORDER — SODIUM CHLORIDE 0.9 % IV SOLN
80.0000 mg/m2 | Freq: Once | INTRAVENOUS | Status: AC
  Administered 2022-10-25: 138 mg via INTRAVENOUS
  Filled 2022-10-25: qty 6.9

## 2022-10-25 MED ORDER — SODIUM CHLORIDE 0.9 % IV SOLN
1500.0000 mg | Freq: Once | INTRAVENOUS | Status: AC
  Administered 2022-10-25: 1500 mg via INTRAVENOUS
  Filled 2022-10-25: qty 30

## 2022-10-25 MED ORDER — HEPARIN SOD (PORK) LOCK FLUSH 100 UNIT/ML IV SOLN
500.0000 [IU] | Freq: Once | INTRAVENOUS | Status: AC | PRN
  Administered 2022-10-25: 500 [IU]

## 2022-10-25 MED FILL — Dexamethasone Sodium Phosphate Inj 100 MG/10ML: INTRAMUSCULAR | Qty: 1 | Status: AC

## 2022-10-25 NOTE — Progress Notes (Signed)
Nutrition follow-up completed with patient during infusion for extensive stage small cell lung cancer.  Weight improved and was documented as 134 pounds May 14 increased from 132 pounds 3.2 ounces April 23.  Labs reviewed.  Noted glucose 107.  Patient reports her appetite is good and she is eating small amounts throughout the day.  Reports she is drinking 2 oral nutrition supplements daily.  She denies nausea, vomiting, constipation, and diarrhea.  She voices no nutrition concerns.  Nutrition diagnosis of Food and nutrition related knowledge deficit resolved.  Encourage patient to contact RD with any questions or concerns.  Will follow as needed.  **Disclaimer: This note was dictated with voice recognition software. Similar sounding words can inadvertently be transcribed and this note may contain transcription errors which may not have been corrected upon publication of note.**

## 2022-10-25 NOTE — Progress Notes (Signed)
Received documents for [name] from patient.  Patient approved for one-time $1000 [name] to assist with personal expenses while going through treatment. She has a copy of the approval letter and expense sheet in green folder. She was advised to contact me at earliest convenience to discuss grant expenses in detail.  She has my card to do so and for any additional financial questions or concerns.

## 2022-10-25 NOTE — Patient Instructions (Signed)
[facility]  Discharge Instructions: Thank you for choosing [facility] to provide your oncology and hematology care.   If you have a lab appointment with the Cancer Center, please go directly to the Cancer Center and check in at the registration area.   Wear comfortable clothing and clothing appropriate for easy access to any Portacath or PICC line.   We strive to give you quality time with your provider. You may need to reschedule your appointment if you arrive late (15 or more minutes).  Arriving late affects you and other patients whose appointments are after yours.  Also, if you miss three or more appointments without notifying the office, you may be dismissed from the clinic at the provider's discretion.      For prescription refill requests, have your pharmacy contact our office and allow 72 hours for refills to be completed.    Today you received the following chemotherapy and/or immunotherapy agents: Carboplatin, Etoposide.       To help prevent nausea and vomiting after your treatment, we encourage you to take your nausea medication as directed.  BELOW ARE SYMPTOMS THAT SHOULD BE REPORTED IMMEDIATELY: *FEVER GREATER THAN 100.4 F (38 C) OR HIGHER *CHILLS OR SWEATING *NAUSEA AND VOMITING THAT IS NOT CONTROLLED WITH YOUR NAUSEA MEDICATION *UNUSUAL SHORTNESS OF BREATH *UNUSUAL BRUISING OR BLEEDING *URINARY PROBLEMS (pain or burning when urinating, or frequent urination) *BOWEL PROBLEMS (unusual diarrhea, constipation, pain near the anus) TENDERNESS IN MOUTH AND THROAT WITH OR WITHOUT PRESENCE OF ULCERS (sore throat, sores in mouth, or a toothache) UNUSUAL RASH, SWELLING OR PAIN  UNUSUAL VAGINAL DISCHARGE OR ITCHING   Items with * indicate a potential emergency and should be followed up as soon as possible or go to the Emergency Department if any problems should occur.  Please show the CHEMOTHERAPY ALERT CARD or IMMUNOTHERAPY  ALERT CARD at check-in to the Emergency Department and triage nurse.  Should you have questions after your visit or need to cancel or reschedule your appointment, please contact [facility]  Dept: [phone]  and follow the prompts.  Office hours are 8:00 a.m. to 4:30 p.m. Monday - Friday. Please note that voicemails left after 4:00 p.m. may not be returned until the following business day.  We are closed weekends and major holidays. You have access to a nurse at all times for urgent questions. Please call the main number to the clinic Dept: [phone] and follow the prompts.   For any non-urgent questions, you may also contact your provider using MyChart. We now offer e-Visits for anyone 18 and older to request care online for non-urgent symptoms. For details visit [email or web address].   Also download the MyChart app! Go to the app store, search "MyChart", open the app, select , and log in with your MyChart username and password.   

## 2022-10-26 ENCOUNTER — Ambulatory Visit: Payer: 59

## 2022-10-26 ENCOUNTER — Encounter: Payer: Self-pay | Admitting: Internal Medicine

## 2022-10-26 ENCOUNTER — Encounter: Payer: Self-pay | Admitting: Physician Assistant

## 2022-10-26 ENCOUNTER — Telehealth: Payer: Self-pay | Admitting: Internal Medicine

## 2022-10-26 ENCOUNTER — Inpatient Hospital Stay: Payer: Medicare HMO

## 2022-10-26 ENCOUNTER — Telehealth: Payer: Self-pay | Admitting: Medical Oncology

## 2022-10-26 ENCOUNTER — Other Ambulatory Visit: Payer: Self-pay

## 2022-10-26 VITALS — BP 139/76 | HR 92 | Temp 97.8°F | Resp 18

## 2022-10-26 DIAGNOSIS — C3490 Malignant neoplasm of unspecified part of unspecified bronchus or lung: Secondary | ICD-10-CM

## 2022-10-26 DIAGNOSIS — Z79634 Long term (current) use of topoisomerase inhibitor: Secondary | ICD-10-CM | POA: Diagnosis not present

## 2022-10-26 DIAGNOSIS — G893 Neoplasm related pain (acute) (chronic): Secondary | ICD-10-CM | POA: Diagnosis not present

## 2022-10-26 DIAGNOSIS — Z79630 Long term (current) use of alkylating agent: Secondary | ICD-10-CM | POA: Diagnosis not present

## 2022-10-26 DIAGNOSIS — C3431 Malignant neoplasm of lower lobe, right bronchus or lung: Secondary | ICD-10-CM | POA: Diagnosis not present

## 2022-10-26 DIAGNOSIS — B20 Human immunodeficiency virus [HIV] disease: Secondary | ICD-10-CM | POA: Diagnosis not present

## 2022-10-26 DIAGNOSIS — Z79620 Long term (current) use of immunosuppressive biologic: Secondary | ICD-10-CM | POA: Diagnosis not present

## 2022-10-26 DIAGNOSIS — Z5111 Encounter for antineoplastic chemotherapy: Secondary | ICD-10-CM | POA: Diagnosis not present

## 2022-10-26 DIAGNOSIS — Z5112 Encounter for antineoplastic immunotherapy: Secondary | ICD-10-CM | POA: Diagnosis not present

## 2022-10-26 MED ORDER — HEPARIN SOD (PORK) LOCK FLUSH 100 UNIT/ML IV SOLN
500.0000 [IU] | Freq: Once | INTRAVENOUS | Status: AC | PRN
  Administered 2022-10-26: 500 [IU]

## 2022-10-26 MED ORDER — SODIUM CHLORIDE 0.9 % IV SOLN
80.0000 mg/m2 | Freq: Once | INTRAVENOUS | Status: AC
  Administered 2022-10-26: 138 mg via INTRAVENOUS
  Filled 2022-10-26: qty 6.9

## 2022-10-26 MED ORDER — SODIUM CHLORIDE 0.9% FLUSH
10.0000 mL | INTRAVENOUS | Status: DC | PRN
  Administered 2022-10-26: 10 mL

## 2022-10-26 MED ORDER — SODIUM CHLORIDE 0.9 % IV SOLN: Freq: Once | INTRAVENOUS | Status: AC

## 2022-10-26 MED ORDER — SODIUM CHLORIDE 0.9 % IV SOLN
10.0000 mg | Freq: Once | INTRAVENOUS | Status: AC
  Administered 2022-10-26: 10 mg via INTRAVENOUS
  Filled 2022-10-26: qty 10

## 2022-10-26 MED FILL — Dexamethasone Sodium Phosphate Inj 100 MG/10ML: INTRAMUSCULAR | Qty: 1 | Status: AC

## 2022-10-26 NOTE — Telephone Encounter (Signed)
The company "[employer] " is requesting a request for pt to receive incontinence underwear through Medicaid.  Stanton Kidney contacted them on line about this . They  need dx code ( the type of incontinence). The company will fax the request.

## 2022-10-26 NOTE — Patient Instructions (Signed)
[facility]  Discharge Instructions: Thank you for choosing [facility] to provide your oncology and hematology care.   If you have a lab appointment with the Cancer Center, please go directly to the Cancer Center and check in at the registration area.   Wear comfortable clothing and clothing appropriate for easy access to any Portacath or PICC line.   We strive to give you quality time with your provider. You may need to reschedule your appointment if you arrive late (15 or more minutes).  Arriving late affects you and other patients whose appointments are after yours.  Also, if you miss three or more appointments without notifying the office, you may be dismissed from the clinic at the provider's discretion.      For prescription refill requests, have your pharmacy contact our office and allow 72 hours for refills to be completed.    Today you received the following chemotherapy and/or immunotherapy agents; Etoposide      To help prevent nausea and vomiting after your treatment, we encourage you to take your nausea medication as directed.  BELOW ARE SYMPTOMS THAT SHOULD BE REPORTED IMMEDIATELY: *FEVER GREATER THAN 100.4 F (38 C) OR HIGHER *CHILLS OR SWEATING *NAUSEA AND VOMITING THAT IS NOT CONTROLLED WITH YOUR NAUSEA MEDICATION *UNUSUAL SHORTNESS OF BREATH *UNUSUAL BRUISING OR BLEEDING *URINARY PROBLEMS (pain or burning when urinating, or frequent urination) *BOWEL PROBLEMS (unusual diarrhea, constipation, pain near the anus) TENDERNESS IN MOUTH AND THROAT WITH OR WITHOUT PRESENCE OF ULCERS (sore throat, sores in mouth, or a toothache) UNUSUAL RASH, SWELLING OR PAIN  UNUSUAL VAGINAL DISCHARGE OR ITCHING   Items with * indicate a potential emergency and should be followed up as soon as possible or go to the Emergency Department if any problems should occur.  Please show the CHEMOTHERAPY ALERT CARD or IMMUNOTHERAPY ALERT CARD at  check-in to the Emergency Department and triage nurse.  Should you have questions after your visit or need to cancel or reschedule your appointment, please contact [facility]  Dept: [phone]  and follow the prompts.  Office hours are 8:00 a.m. to 4:30 p.m. Monday - Friday. Please note that voicemails left after 4:00 p.m. may not be returned until the following business day.  We are closed weekends and major holidays. You have access to a nurse at all times for urgent questions. Please call the main number to the clinic Dept: [phone] and follow the prompts.   For any non-urgent questions, you may also contact your provider using MyChart. We now offer e-Visits for anyone 18 and older to request care online for non-urgent symptoms. For details visit [email or web address].   Also download the MyChart app! Go to the app store, search "MyChart", open the app, select [place], and log in with your MyChart username and password.   

## 2022-10-27 ENCOUNTER — Ambulatory Visit: Payer: 59

## 2022-10-27 ENCOUNTER — Inpatient Hospital Stay: Payer: Medicare HMO

## 2022-10-27 VITALS — BP 116/68 | HR 94 | Temp 98.1°F | Resp 19 | Wt 139.2 lb

## 2022-10-27 DIAGNOSIS — Z79634 Long term (current) use of topoisomerase inhibitor: Secondary | ICD-10-CM | POA: Diagnosis not present

## 2022-10-27 DIAGNOSIS — Z79630 Long term (current) use of alkylating agent: Secondary | ICD-10-CM | POA: Diagnosis not present

## 2022-10-27 DIAGNOSIS — B20 Human immunodeficiency virus [HIV] disease: Secondary | ICD-10-CM | POA: Diagnosis not present

## 2022-10-27 DIAGNOSIS — G893 Neoplasm related pain (acute) (chronic): Secondary | ICD-10-CM | POA: Diagnosis not present

## 2022-10-27 DIAGNOSIS — Z5111 Encounter for antineoplastic chemotherapy: Secondary | ICD-10-CM | POA: Diagnosis not present

## 2022-10-27 DIAGNOSIS — C3490 Malignant neoplasm of unspecified part of unspecified bronchus or lung: Secondary | ICD-10-CM

## 2022-10-27 DIAGNOSIS — C3431 Malignant neoplasm of lower lobe, right bronchus or lung: Secondary | ICD-10-CM | POA: Diagnosis not present

## 2022-10-27 DIAGNOSIS — Z79620 Long term (current) use of immunosuppressive biologic: Secondary | ICD-10-CM | POA: Diagnosis not present

## 2022-10-27 DIAGNOSIS — Z5112 Encounter for antineoplastic immunotherapy: Secondary | ICD-10-CM | POA: Diagnosis not present

## 2022-10-27 LAB — T4: T4, Total: 4.8 ug/dL (ref 4.5–12.0)

## 2022-10-27 MED ORDER — SODIUM CHLORIDE 0.9 % IV SOLN
80.0000 mg/m2 | Freq: Once | INTRAVENOUS | Status: AC
  Administered 2022-10-27: 138 mg via INTRAVENOUS
  Filled 2022-10-27: qty 6.9

## 2022-10-27 MED ORDER — SODIUM CHLORIDE 0.9% FLUSH
10.0000 mL | INTRAVENOUS | Status: DC | PRN
  Administered 2022-10-27: 10 mL

## 2022-10-27 MED ORDER — SODIUM CHLORIDE 0.9 % IV SOLN
10.0000 mg | Freq: Once | INTRAVENOUS | Status: AC
  Administered 2022-10-27: 10 mg via INTRAVENOUS
  Filled 2022-10-27: qty 10

## 2022-10-27 MED ORDER — HEPARIN SOD (PORK) LOCK FLUSH 100 UNIT/ML IV SOLN
500.0000 [IU] | Freq: Once | INTRAVENOUS | Status: AC | PRN
  Administered 2022-10-27: 500 [IU]

## 2022-10-27 MED ORDER — SODIUM CHLORIDE 0.9 % IV SOLN: Freq: Once | INTRAVENOUS | Status: AC

## 2022-10-27 NOTE — Patient Instructions (Signed)
[facility]  Discharge Instructions: Thank you for choosing [facility] to provide your oncology and hematology care.   If you have a lab appointment with the Cancer Center, please go directly to the Cancer Center and check in at the registration area.   Wear comfortable clothing and clothing appropriate for easy access to any Portacath or PICC line.   We strive to give you quality time with your provider. You may need to reschedule your appointment if you arrive late (15 or more minutes).  Arriving late affects you and other patients whose appointments are after yours.  Also, if you miss three or more appointments without notifying the office, you may be dismissed from the clinic at the provider's discretion.      For prescription refill requests, have your pharmacy contact our office and allow 72 hours for refills to be completed.    Today you received the following chemotherapy and/or immunotherapy agents; Etoposide      To help prevent nausea and vomiting after your treatment, we encourage you to take your nausea medication as directed.  BELOW ARE SYMPTOMS THAT SHOULD BE REPORTED IMMEDIATELY: *FEVER GREATER THAN 100.4 F (38 C) OR HIGHER *CHILLS OR SWEATING *NAUSEA AND VOMITING THAT IS NOT CONTROLLED WITH YOUR NAUSEA MEDICATION *UNUSUAL SHORTNESS OF BREATH *UNUSUAL BRUISING OR BLEEDING *URINARY PROBLEMS (pain or burning when urinating, or frequent urination) *BOWEL PROBLEMS (unusual diarrhea, constipation, pain near the anus) TENDERNESS IN MOUTH AND THROAT WITH OR WITHOUT PRESENCE OF ULCERS (sore throat, sores in mouth, or a toothache) UNUSUAL RASH, SWELLING OR PAIN  UNUSUAL VAGINAL DISCHARGE OR ITCHING   Items with * indicate a potential emergency and should be followed up as soon as possible or go to the Emergency Department if any problems should occur.  Please show the CHEMOTHERAPY ALERT CARD or IMMUNOTHERAPY ALERT CARD at  check-in to the Emergency Department and triage nurse.  Should you have questions after your visit or need to cancel or reschedule your appointment, please contact [facility]  Dept: [phone]  and follow the prompts.  Office hours are 8:00 a.m. to 4:30 p.m. Monday - Friday. Please note that voicemails left after 4:00 p.m. may not be returned until the following business day.  We are closed weekends and major holidays. You have access to a nurse at all times for urgent questions. Please call the main number to the clinic Dept: [phone] and follow the prompts.   For any non-urgent questions, you may also contact your provider using MyChart. We now offer e-Visits for anyone 18 and older to request care online for non-urgent symptoms. For details visit [email or web address].   Also download the MyChart app! Go to the app store, search "MyChart", open the app, select [facility], and log in with your MyChart username and password.   

## 2022-10-28 ENCOUNTER — Other Ambulatory Visit: Payer: Self-pay

## 2022-10-29 ENCOUNTER — Inpatient Hospital Stay: Payer: Medicare HMO

## 2022-10-29 ENCOUNTER — Other Ambulatory Visit (HOSPITAL_COMMUNITY): Payer: Self-pay

## 2022-10-29 VITALS — BP 100/66 | HR 96 | Temp 97.0°F | Resp 20

## 2022-10-29 DIAGNOSIS — C3490 Malignant neoplasm of unspecified part of unspecified bronchus or lung: Secondary | ICD-10-CM

## 2022-10-29 DIAGNOSIS — Z5111 Encounter for antineoplastic chemotherapy: Secondary | ICD-10-CM | POA: Diagnosis not present

## 2022-10-29 DIAGNOSIS — G893 Neoplasm related pain (acute) (chronic): Secondary | ICD-10-CM | POA: Diagnosis not present

## 2022-10-29 DIAGNOSIS — Z79634 Long term (current) use of topoisomerase inhibitor: Secondary | ICD-10-CM | POA: Diagnosis not present

## 2022-10-29 DIAGNOSIS — Z5112 Encounter for antineoplastic immunotherapy: Secondary | ICD-10-CM | POA: Diagnosis not present

## 2022-10-29 DIAGNOSIS — Z79620 Long term (current) use of immunosuppressive biologic: Secondary | ICD-10-CM | POA: Diagnosis not present

## 2022-10-29 DIAGNOSIS — Z79630 Long term (current) use of alkylating agent: Secondary | ICD-10-CM | POA: Diagnosis not present

## 2022-10-29 DIAGNOSIS — B20 Human immunodeficiency virus [HIV] disease: Secondary | ICD-10-CM | POA: Diagnosis not present

## 2022-10-29 DIAGNOSIS — C3431 Malignant neoplasm of lower lobe, right bronchus or lung: Secondary | ICD-10-CM | POA: Diagnosis not present

## 2022-10-29 MED ORDER — PEGFILGRASTIM-CBQV 6 MG/0.6ML ~~LOC~~ SOSY
6.0000 mg | PREFILLED_SYRINGE | Freq: Once | SUBCUTANEOUS | Status: AC
  Administered 2022-10-29: 6 mg via SUBCUTANEOUS

## 2022-11-01 ENCOUNTER — Other Ambulatory Visit: Payer: Self-pay

## 2022-11-01 ENCOUNTER — Inpatient Hospital Stay: Payer: Medicare HMO

## 2022-11-01 ENCOUNTER — Other Ambulatory Visit: Payer: 59

## 2022-11-01 DIAGNOSIS — Z79620 Long term (current) use of immunosuppressive biologic: Secondary | ICD-10-CM | POA: Diagnosis not present

## 2022-11-01 DIAGNOSIS — Z79630 Long term (current) use of alkylating agent: Secondary | ICD-10-CM | POA: Diagnosis not present

## 2022-11-01 DIAGNOSIS — C3490 Malignant neoplasm of unspecified part of unspecified bronchus or lung: Secondary | ICD-10-CM

## 2022-11-01 DIAGNOSIS — Z5111 Encounter for antineoplastic chemotherapy: Secondary | ICD-10-CM | POA: Diagnosis not present

## 2022-11-01 DIAGNOSIS — Z95828 Presence of other vascular implants and grafts: Secondary | ICD-10-CM

## 2022-11-01 DIAGNOSIS — B20 Human immunodeficiency virus [HIV] disease: Secondary | ICD-10-CM | POA: Diagnosis not present

## 2022-11-01 DIAGNOSIS — C3431 Malignant neoplasm of lower lobe, right bronchus or lung: Secondary | ICD-10-CM | POA: Diagnosis not present

## 2022-11-01 DIAGNOSIS — Z79634 Long term (current) use of topoisomerase inhibitor: Secondary | ICD-10-CM | POA: Diagnosis not present

## 2022-11-01 DIAGNOSIS — G893 Neoplasm related pain (acute) (chronic): Secondary | ICD-10-CM | POA: Diagnosis not present

## 2022-11-01 DIAGNOSIS — Z5112 Encounter for antineoplastic immunotherapy: Secondary | ICD-10-CM | POA: Diagnosis not present

## 2022-11-01 LAB — CBC WITH DIFFERENTIAL (CANCER CENTER ONLY)
Abs Immature Granulocytes: 0.63 10*3/uL — ABNORMAL HIGH (ref 0.00–0.07)
Basophils Absolute: 0.2 10*3/uL — ABNORMAL HIGH (ref 0.0–0.1)
Basophils Relative: 1 %
Eosinophils Absolute: 0.1 10*3/uL (ref 0.0–0.5)
Eosinophils Relative: 0 %
HCT: 26.8 % — ABNORMAL LOW (ref 36.0–46.0)
Hemoglobin: 8.7 g/dL — ABNORMAL LOW (ref 12.0–15.0)
Immature Granulocytes: 2 %
Lymphocytes Relative: 5 %
Lymphs Abs: 1.7 10*3/uL (ref 0.7–4.0)
MCH: 32.5 pg (ref 26.0–34.0)
MCHC: 32.5 g/dL (ref 30.0–36.0)
MCV: 100 fL (ref 80.0–100.0)
Monocytes Absolute: 0.8 10*3/uL (ref 0.1–1.0)
Monocytes Relative: 2 %
Neutro Abs: 30.7 10*3/uL — ABNORMAL HIGH (ref 1.7–7.7)
Neutrophils Relative %: 90 %
Platelet Count: 287 10*3/uL (ref 150–400)
RBC: 2.68 MIL/uL — ABNORMAL LOW (ref 3.87–5.11)
RDW: 17.8 % — ABNORMAL HIGH (ref 11.5–15.5)
Smear Review: NORMAL
WBC Count: 34 10*3/uL — ABNORMAL HIGH (ref 4.0–10.5)
nRBC: 0 % (ref 0.0–0.2)

## 2022-11-01 LAB — CMP (CANCER CENTER ONLY)
ALT: 9 U/L (ref 0–44)
AST: 11 U/L — ABNORMAL LOW (ref 15–41)
Albumin: 3.7 g/dL (ref 3.5–5.0)
Alkaline Phosphatase: 117 U/L (ref 38–126)
Anion gap: 5 (ref 5–15)
BUN: 17 mg/dL (ref 8–23)
CO2: 22 mmol/L (ref 22–32)
Calcium: 9.1 mg/dL (ref 8.9–10.3)
Chloride: 108 mmol/L (ref 98–111)
Creatinine: 0.59 mg/dL (ref 0.44–1.00)
GFR, Estimated: >60 mL/min (ref >60)
Glucose, Bld: 99 mg/dL (ref 70–99)
Potassium: 3.7 mmol/L (ref 3.5–5.1)
Sodium: 135 mmol/L (ref 135–145)
Total Bilirubin: 0.2 mg/dL — ABNORMAL LOW (ref 0.3–1.2)
Total Protein: 8.1 g/dL (ref 6.5–8.1)

## 2022-11-01 LAB — SAMPLE TO BLOOD BANK

## 2022-11-01 MED ORDER — HEPARIN SOD (PORK) LOCK FLUSH 100 UNIT/ML IV SOLN
500.0000 [IU] | Freq: Once | INTRAVENOUS | Status: AC
  Administered 2022-11-01: 500 [IU]

## 2022-11-01 MED ORDER — SODIUM CHLORIDE 0.9% FLUSH
10.0000 mL | Freq: Once | INTRAVENOUS | Status: AC
  Administered 2022-11-01: 10 mL

## 2022-11-02 ENCOUNTER — Encounter: Payer: Self-pay | Admitting: Internal Medicine

## 2022-11-02 ENCOUNTER — Encounter: Payer: Self-pay | Admitting: Physician Assistant

## 2022-11-08 ENCOUNTER — Inpatient Hospital Stay: Payer: Medicare HMO

## 2022-11-08 ENCOUNTER — Other Ambulatory Visit: Payer: Self-pay

## 2022-11-08 DIAGNOSIS — Z5111 Encounter for antineoplastic chemotherapy: Secondary | ICD-10-CM | POA: Diagnosis not present

## 2022-11-08 DIAGNOSIS — C3431 Malignant neoplasm of lower lobe, right bronchus or lung: Secondary | ICD-10-CM | POA: Diagnosis not present

## 2022-11-08 DIAGNOSIS — Z95828 Presence of other vascular implants and grafts: Secondary | ICD-10-CM

## 2022-11-08 DIAGNOSIS — Z5112 Encounter for antineoplastic immunotherapy: Secondary | ICD-10-CM | POA: Diagnosis not present

## 2022-11-08 DIAGNOSIS — C3490 Malignant neoplasm of unspecified part of unspecified bronchus or lung: Secondary | ICD-10-CM

## 2022-11-08 DIAGNOSIS — Z79630 Long term (current) use of alkylating agent: Secondary | ICD-10-CM | POA: Diagnosis not present

## 2022-11-08 DIAGNOSIS — G893 Neoplasm related pain (acute) (chronic): Secondary | ICD-10-CM | POA: Diagnosis not present

## 2022-11-08 DIAGNOSIS — B20 Human immunodeficiency virus [HIV] disease: Secondary | ICD-10-CM | POA: Diagnosis not present

## 2022-11-08 DIAGNOSIS — Z79634 Long term (current) use of topoisomerase inhibitor: Secondary | ICD-10-CM | POA: Diagnosis not present

## 2022-11-08 DIAGNOSIS — Z79620 Long term (current) use of immunosuppressive biologic: Secondary | ICD-10-CM | POA: Diagnosis not present

## 2022-11-08 LAB — CMP (CANCER CENTER ONLY)
ALT: 25 U/L (ref 0–44)
AST: 18 U/L (ref 15–41)
Albumin: 3.8 g/dL (ref 3.5–5.0)
Alkaline Phosphatase: 103 U/L (ref 38–126)
Anion gap: 6 (ref 5–15)
BUN: 13 mg/dL (ref 8–23)
CO2: 22 mmol/L (ref 22–32)
Calcium: 9.5 mg/dL (ref 8.9–10.3)
Chloride: 108 mmol/L (ref 98–111)
Creatinine: 0.63 mg/dL (ref 0.44–1.00)
GFR, Estimated: >60 mL/min (ref >60)
Glucose, Bld: 92 mg/dL (ref 70–99)
Potassium: 3.7 mmol/L (ref 3.5–5.1)
Sodium: 136 mmol/L (ref 135–145)
Total Bilirubin: 0.2 mg/dL — ABNORMAL LOW (ref 0.3–1.2)
Total Protein: 8.4 g/dL — ABNORMAL HIGH (ref 6.5–8.1)

## 2022-11-08 LAB — SAMPLE TO BLOOD BANK

## 2022-11-08 LAB — CBC WITH DIFFERENTIAL (CANCER CENTER ONLY)
Abs Immature Granulocytes: 0.07 10*3/uL (ref 0.00–0.07)
Basophils Absolute: 0.1 10*3/uL (ref 0.0–0.1)
Basophils Relative: 1 %
Eosinophils Absolute: 0.2 10*3/uL (ref 0.0–0.5)
Eosinophils Relative: 2 %
HCT: 31.2 % — ABNORMAL LOW (ref 36.0–46.0)
Hemoglobin: 10.1 g/dL — ABNORMAL LOW (ref 12.0–15.0)
Immature Granulocytes: 1 %
Lymphocytes Relative: 15 %
Lymphs Abs: 1.5 10*3/uL (ref 0.7–4.0)
MCH: 32.7 pg (ref 26.0–34.0)
MCHC: 32.4 g/dL (ref 30.0–36.0)
MCV: 101 fL — ABNORMAL HIGH (ref 80.0–100.0)
Monocytes Absolute: 0.6 10*3/uL (ref 0.1–1.0)
Monocytes Relative: 6 %
Neutro Abs: 7.6 10*3/uL (ref 1.7–7.7)
Neutrophils Relative %: 75 %
Platelet Count: 250 10*3/uL (ref 150–400)
RBC: 3.09 MIL/uL — ABNORMAL LOW (ref 3.87–5.11)
RDW: 18.4 % — ABNORMAL HIGH (ref 11.5–15.5)
WBC Count: 10 10*3/uL (ref 4.0–10.5)
nRBC: 0 % (ref 0.0–0.2)

## 2022-11-08 MED ORDER — SODIUM CHLORIDE 0.9% FLUSH
10.0000 mL | Freq: Once | INTRAVENOUS | Status: AC
  Administered 2022-11-08: 10 mL

## 2022-11-08 MED ORDER — HEPARIN SOD (PORK) LOCK FLUSH 100 UNIT/ML IV SOLN
500.0000 [IU] | Freq: Once | INTRAVENOUS | Status: AC
  Administered 2022-11-08: 500 [IU]

## 2022-11-10 ENCOUNTER — Other Ambulatory Visit (HOSPITAL_COMMUNITY): Payer: Self-pay

## 2022-11-11 ENCOUNTER — Ambulatory Visit (HOSPITAL_COMMUNITY)
Admission: RE | Admit: 2022-11-11 | Discharge: 2022-11-11 | Disposition: A | Discharge status: Home / Self Care | Payer: Medicare HMO | Source: Ambulatory Visit | Attending: Internal Medicine | Admitting: Internal Medicine

## 2022-11-11 ENCOUNTER — Encounter (HOSPITAL_COMMUNITY): Payer: Self-pay

## 2022-11-11 ENCOUNTER — Ambulatory Visit (HOSPITAL_COMMUNITY)
Admission: RE | Admit: 2022-11-11 | Discharge: 2022-11-11 | Disposition: A | Discharge status: Home / Self Care | Payer: Medicare HMO | Source: Ambulatory Visit | Attending: Physician Assistant

## 2022-11-11 DIAGNOSIS — J439 Emphysema, unspecified: Secondary | ICD-10-CM | POA: Diagnosis not present

## 2022-11-11 DIAGNOSIS — R918 Other nonspecific abnormal finding of lung field: Secondary | ICD-10-CM | POA: Diagnosis not present

## 2022-11-11 DIAGNOSIS — C3490 Malignant neoplasm of unspecified part of unspecified bronchus or lung: Secondary | ICD-10-CM

## 2022-11-11 DIAGNOSIS — R9089 Other abnormal findings on diagnostic imaging of central nervous system: Secondary | ICD-10-CM | POA: Diagnosis not present

## 2022-11-11 MED ORDER — IOHEXOL 300 MG/ML  SOLN
100.0000 mL | Freq: Once | INTRAMUSCULAR | Status: AC | PRN
  Administered 2022-11-11: 75 mL via INTRAVENOUS

## 2022-11-11 MED ORDER — HEPARIN SOD (PORK) LOCK FLUSH 100 UNIT/ML IV SOLN
500.0000 [IU] | Freq: Once | INTRAVENOUS | Status: AC
  Administered 2022-11-11: 500 [IU] via INTRAVENOUS

## 2022-11-11 MED ORDER — HEPARIN SOD (PORK) LOCK FLUSH 100 UNIT/ML IV SOLN
INTRAVENOUS | Status: AC
  Filled 2022-11-11: qty 5

## 2022-11-11 MED ORDER — GADOBUTROL 1 MMOL/ML IV SOLN
6.0000 mL | Freq: Once | INTRAVENOUS | Status: AC | PRN
  Administered 2022-11-11: 6 mL via INTRAVENOUS

## 2022-11-11 MED ORDER — SODIUM CHLORIDE (PF) 0.9 % IJ SOLN
INTRAMUSCULAR | Status: AC
  Filled 2022-11-11: qty 50

## 2022-11-14 ENCOUNTER — Inpatient Hospital Stay: Payer: Medicare HMO | Attending: Internal Medicine | Admitting: Internal Medicine

## 2022-11-14 DIAGNOSIS — R9089 Other abnormal findings on diagnostic imaging of central nervous system: Secondary | ICD-10-CM

## 2022-11-14 DIAGNOSIS — Z5112 Encounter for antineoplastic immunotherapy: Secondary | ICD-10-CM | POA: Insufficient documentation

## 2022-11-14 DIAGNOSIS — R930 Abnormal findings on diagnostic imaging of skull and head, not elsewhere classified: Secondary | ICD-10-CM | POA: Insufficient documentation

## 2022-11-14 DIAGNOSIS — Z5111 Encounter for antineoplastic chemotherapy: Secondary | ICD-10-CM | POA: Insufficient documentation

## 2022-11-14 DIAGNOSIS — Z5189 Encounter for other specified aftercare: Secondary | ICD-10-CM | POA: Insufficient documentation

## 2022-11-14 DIAGNOSIS — Z21 Asymptomatic human immunodeficiency virus [HIV] infection status: Secondary | ICD-10-CM | POA: Insufficient documentation

## 2022-11-14 DIAGNOSIS — C3431 Malignant neoplasm of lower lobe, right bronchus or lung: Secondary | ICD-10-CM | POA: Insufficient documentation

## 2022-11-14 MED FILL — Fosaprepitant Dimeglumine For IV Infusion 150 MG (Base Eq): INTRAVENOUS | Qty: 5 | Status: AC

## 2022-11-14 MED FILL — Dexamethasone Sodium Phosphate Inj 100 MG/10ML: INTRAMUSCULAR | Qty: 1 | Status: AC

## 2022-11-14 NOTE — Progress Notes (Signed)
I connected with [name] on 11/14/22 at  9:00 AM EDT by telephone visit and verified that I am speaking with the correct person using two identifiers.  I discussed the limitations, risks, security and privacy concerns of performing an evaluation and management service by telemedicine and the availability of in-person appointments. I also discussed with the patient that there may be a patient responsible charge related to this service. The patient expressed understanding and agreed to proceed.  Other persons participating in the visit and their role in the encounter:  n/a  Patient's location:  Home Provider's location:  Office Chief Complaint:  Abnormal brain MRI  History of Present Ilness: [name] reports no clinical changes today.  No new or progressive neurologic symptoms.   Observations: Language and cognition at baseline  Brain MRI official read pending.  My read- stable findings, no progression or novel site of enhancement.  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  Recommended continued MRI surveillance.  We ask that [name] return to clinic in 6 months following next brain MRI, or sooner as needed.  Follow Up Instructions:  I discussed the assessment and treatment plan with the patient.  The patient was provided an opportunity to ask questions and all were answered.  The patient agreed with the plan and demonstrated understanding of the instructions.    The patient was advised to call back or seek an in-person evaluation if the symptoms worsen or if the condition fails to improve as anticipated.    Henreitta Leber, MD   I provided 22 minutes of non face-to-face telephone visit time during this encounter, and > 50% was spent counseling as documented under my assessment & plan.

## 2022-11-15 ENCOUNTER — Other Ambulatory Visit: Payer: Self-pay

## 2022-11-15 ENCOUNTER — Encounter: Payer: Self-pay | Admitting: Medical Oncology

## 2022-11-15 ENCOUNTER — Inpatient Hospital Stay (HOSPITAL_BASED_OUTPATIENT_CLINIC_OR_DEPARTMENT_OTHER): Payer: Medicare HMO | Admitting: Internal Medicine

## 2022-11-15 ENCOUNTER — Inpatient Hospital Stay: Payer: Medicare HMO

## 2022-11-15 ENCOUNTER — Telehealth: Payer: 59 | Admitting: Internal Medicine

## 2022-11-15 ENCOUNTER — Telehealth: Payer: Self-pay | Admitting: Adult Health

## 2022-11-15 ENCOUNTER — Encounter: Payer: Self-pay | Admitting: Internal Medicine

## 2022-11-15 DIAGNOSIS — Z21 Asymptomatic human immunodeficiency virus [HIV] infection status: Secondary | ICD-10-CM | POA: Diagnosis not present

## 2022-11-15 DIAGNOSIS — C3490 Malignant neoplasm of unspecified part of unspecified bronchus or lung: Secondary | ICD-10-CM

## 2022-11-15 DIAGNOSIS — Z5111 Encounter for antineoplastic chemotherapy: Secondary | ICD-10-CM | POA: Diagnosis not present

## 2022-11-15 DIAGNOSIS — C3431 Malignant neoplasm of lower lobe, right bronchus or lung: Secondary | ICD-10-CM | POA: Diagnosis not present

## 2022-11-15 DIAGNOSIS — Z95828 Presence of other vascular implants and grafts: Secondary | ICD-10-CM

## 2022-11-15 DIAGNOSIS — Z5189 Encounter for other specified aftercare: Secondary | ICD-10-CM | POA: Diagnosis not present

## 2022-11-15 DIAGNOSIS — R930 Abnormal findings on diagnostic imaging of skull and head, not elsewhere classified: Secondary | ICD-10-CM | POA: Diagnosis not present

## 2022-11-15 DIAGNOSIS — Z5112 Encounter for antineoplastic immunotherapy: Secondary | ICD-10-CM | POA: Diagnosis not present

## 2022-11-15 LAB — CMP (CANCER CENTER ONLY)
ALT: 10 U/L (ref 0–44)
AST: 13 U/L — ABNORMAL LOW (ref 15–41)
Albumin: 3.6 g/dL (ref 3.5–5.0)
Alkaline Phosphatase: 76 U/L (ref 38–126)
Anion gap: 4 — ABNORMAL LOW (ref 5–15)
BUN: 16 mg/dL (ref 8–23)
CO2: 26 mmol/L (ref 22–32)
Calcium: 9.4 mg/dL (ref 8.9–10.3)
Chloride: 109 mmol/L (ref 98–111)
Creatinine: 0.67 mg/dL (ref 0.44–1.00)
GFR, Estimated: >60 mL/min (ref >60)
Glucose, Bld: 123 mg/dL — ABNORMAL HIGH (ref 70–99)
Potassium: 3.6 mmol/L (ref 3.5–5.1)
Sodium: 139 mmol/L (ref 135–145)
Total Bilirubin: 0.2 mg/dL — ABNORMAL LOW (ref 0.3–1.2)
Total Protein: 8.2 g/dL — ABNORMAL HIGH (ref 6.5–8.1)

## 2022-11-15 LAB — CBC WITH DIFFERENTIAL (CANCER CENTER ONLY)
Abs Immature Granulocytes: 0.01 10*3/uL (ref 0.00–0.07)
Basophils Absolute: 0.1 10*3/uL (ref 0.0–0.1)
Basophils Relative: 1 %
Eosinophils Absolute: 0.1 10*3/uL (ref 0.0–0.5)
Eosinophils Relative: 2 %
HCT: 30.7 % — ABNORMAL LOW (ref 36.0–46.0)
Hemoglobin: 9.9 g/dL — ABNORMAL LOW (ref 12.0–15.0)
Immature Granulocytes: 0 %
Lymphocytes Relative: 27 %
Lymphs Abs: 1.3 10*3/uL (ref 0.7–4.0)
MCH: 32.9 pg (ref 26.0–34.0)
MCHC: 32.2 g/dL (ref 30.0–36.0)
MCV: 102 fL — ABNORMAL HIGH (ref 80.0–100.0)
Monocytes Absolute: 0.4 10*3/uL (ref 0.1–1.0)
Monocytes Relative: 8 %
Neutro Abs: 3 10*3/uL (ref 1.7–7.7)
Neutrophils Relative %: 62 %
Platelet Count: 300 10*3/uL (ref 150–400)
RBC: 3.01 MIL/uL — ABNORMAL LOW (ref 3.87–5.11)
RDW: 17.2 % — ABNORMAL HIGH (ref 11.5–15.5)
WBC Count: 4.8 10*3/uL (ref 4.0–10.5)
nRBC: 0 % (ref 0.0–0.2)

## 2022-11-15 MED ORDER — SODIUM CHLORIDE 0.9 % IV SOLN
10.0000 mg | Freq: Once | INTRAVENOUS | Status: AC
  Administered 2022-11-15: 10 mg via INTRAVENOUS
  Filled 2022-11-15: qty 10

## 2022-11-15 MED ORDER — SODIUM CHLORIDE 0.9 % IV SOLN
80.0000 mg/m2 | Freq: Once | INTRAVENOUS | Status: AC
  Administered 2022-11-15: 138 mg via INTRAVENOUS
  Filled 2022-11-15: qty 6.9

## 2022-11-15 MED ORDER — SODIUM CHLORIDE 0.9% FLUSH
10.0000 mL | INTRAVENOUS | Status: DC | PRN
  Administered 2022-11-15: 10 mL

## 2022-11-15 MED ORDER — PALONOSETRON HCL INJECTION 0.25 MG/5ML
0.2500 mg | Freq: Once | INTRAVENOUS | Status: AC
  Administered 2022-11-15: 0.25 mg via INTRAVENOUS
  Filled 2022-11-15: qty 5

## 2022-11-15 MED ORDER — SODIUM CHLORIDE 0.9 % IV SOLN
150.0000 mg | Freq: Once | INTRAVENOUS | Status: AC
  Administered 2022-11-15: 150 mg via INTRAVENOUS
  Filled 2022-11-15: qty 150

## 2022-11-15 MED ORDER — SODIUM CHLORIDE 0.9 % IV SOLN
316.0000 mg | Freq: Once | INTRAVENOUS | Status: AC
  Administered 2022-11-15: 320 mg via INTRAVENOUS
  Filled 2022-11-15: qty 32

## 2022-11-15 MED ORDER — SODIUM CHLORIDE 0.9 % IV SOLN
1500.0000 mg | Freq: Once | INTRAVENOUS | Status: AC
  Administered 2022-11-15: 1500 mg via INTRAVENOUS
  Filled 2022-11-15: qty 30

## 2022-11-15 MED ORDER — SODIUM CHLORIDE 0.9% FLUSH
10.0000 mL | Freq: Once | INTRAVENOUS | Status: AC
  Administered 2022-11-15: 10 mL

## 2022-11-15 MED ORDER — SODIUM CHLORIDE 0.9 % IV SOLN: Freq: Once | INTRAVENOUS | Status: AC

## 2022-11-15 MED ORDER — HEPARIN SOD (PORK) LOCK FLUSH 100 UNIT/ML IV SOLN
500.0000 [IU] | Freq: Once | INTRAVENOUS | Status: AC | PRN
  Administered 2022-11-15: 500 [IU]

## 2022-11-15 MED FILL — Dexamethasone Sodium Phosphate Inj 100 MG/10ML: INTRAMUSCULAR | Qty: 1 | Status: AC

## 2022-11-15 NOTE — Patient Instructions (Signed)
[facility]  Discharge Instructions: Thank you for choosing [facility] to provide your oncology and hematology care.   If you have a lab appointment with the Cancer Center, please go directly to the Cancer Center and check in at the registration area.   Wear comfortable clothing and clothing appropriate for easy access to any Portacath or PICC line.   We strive to give you quality time with your provider. You may need to reschedule your appointment if you arrive late (15 or more minutes).  Arriving late affects you and other patients whose appointments are after yours.  Also, if you miss three or more appointments without notifying the office, you may be dismissed from the clinic at the provider's discretion.      For prescription refill requests, have your pharmacy contact our office and allow 72 hours for refills to be completed.    Today you received the following chemotherapy and/or immunotherapy agents: Imfinzi, Carboplatin, Etoposide      To help prevent nausea and vomiting after your treatment, we encourage you to take your nausea medication as directed.  BELOW ARE SYMPTOMS THAT SHOULD BE REPORTED IMMEDIATELY: *FEVER GREATER THAN 100.4 F (38 C) OR HIGHER *CHILLS OR SWEATING *NAUSEA AND VOMITING THAT IS NOT CONTROLLED WITH YOUR NAUSEA MEDICATION *UNUSUAL SHORTNESS OF BREATH *UNUSUAL BRUISING OR BLEEDING *URINARY PROBLEMS (pain or burning when urinating, or frequent urination) *BOWEL PROBLEMS (unusual diarrhea, constipation, pain near the anus) TENDERNESS IN MOUTH AND THROAT WITH OR WITHOUT PRESENCE OF ULCERS (sore throat, sores in mouth, or a toothache) UNUSUAL RASH, SWELLING OR PAIN  UNUSUAL VAGINAL DISCHARGE OR ITCHING   Items with * indicate a potential emergency and should be followed up as soon as possible or go to the Emergency Department if any problems should occur.  Please show the CHEMOTHERAPY ALERT CARD or  IMMUNOTHERAPY ALERT CARD at check-in to the Emergency Department and triage nurse.  Should you have questions after your visit or need to cancel or reschedule your appointment, please contact [facility]  Dept: [phone]  and follow the prompts.  Office hours are 8:00 a.m. to 4:30 p.m. Monday - Friday. Please note that voicemails left after 4:00 p.m. may not be returned until the following business day.  We are closed weekends and major holidays. You have access to a nurse at all times for urgent questions. Please call the main number to the clinic Dept: [phone] and follow the prompts.   For any non-urgent questions, you may also contact your provider using MyChart. We now offer e-Visits for anyone 18 and older to request care online for non-urgent symptoms. For details visit [email or web address].   Also download the MyChart app! Go to the app store, search "MyChart", open the app, select , and log in with your MyChart username and password. Durvalumab Injection What is this medication? DURVALUMAB (dur VAL ue mab) treats some types of cancer. It works by helping your immune system slow or stop the spread of cancer cells. It is a monoclonal antibody. This medicine may be used for other purposes; ask your health care provider or pharmacist if you have questions. COMMON BRAND NAME(S): IMFINZI What should I tell my care team before I take this medication? They need to know if you have any of these conditions: Allogeneic stem cell transplant (uses someone else's stem cells) Autoimmune diseases, such as Crohn disease, ulcerative colitis, lupus History of chest radiation Nervous   system problems, such as Guillain-Barre syndrome, myasthenia gravis Organ transplant An unusual or allergic reaction to durvalumab, other medications, foods, dyes, or preservatives Pregnant or trying to get pregnant Breast-feeding How should I use this  medication? This medication is infused into a vein. It is given by your care team in a hospital or clinic setting. A special MedGuide will be given to you before each treatment. Be sure to read this information carefully each time. Talk to your care team about the use of this medication in children. Special care may be needed. Overdosage: If you think you have taken too much of this medicine contact a poison control center or emergency room at once. NOTE: This medicine is only for you. Do not share this medicine with others. What if I miss a dose? Keep appointments for follow-up doses. It is important not to miss your dose. Call your care team if you are unable to keep an appointment. What may interact with this medication? Interactions have not been studied. This list may not describe all possible interactions. Give your health care provider a list of all the medicines, herbs, non-prescription drugs, or dietary supplements you use. Also tell them if you smoke, drink alcohol, or use illegal drugs. Some items may interact with your medicine. What should I watch for while using this medication? Your condition will be monitored carefully while you are receiving this medication. You may need blood work while taking this medication. This medication may cause serious skin reactions. They can happen weeks to months after starting the medication. Contact your care team right away if you notice fevers or flu-like symptoms with a rash. The rash may be red or purple and then turn into blisters or peeling of the skin. You may also notice a red rash with swelling of the face, lips, or lymph nodes in your neck or under your arms. Tell your care team right away if you have any change in your eyesight. Talk to your care team if you may be pregnant. Serious birth defects can occur if you take this medication during pregnancy and for 3 months after the last dose. You will need a negative pregnancy test before starting  this medication. Contraception is recommended while taking this medication and for 3 months after the last dose. Your care team can help you find the option that works for you. Do not breastfeed while taking this medication and for 3 months after the last dose. What side effects may I notice from receiving this medication? Side effects that you should report to your care team as soon as possible: Allergic reactions--skin rash, itching, hives, swelling of the face, lips, tongue, or throat Dry cough, shortness of breath or trouble breathing Eye pain, redness, irritation, or discharge with blurry or decreased vision Heart muscle inflammation--unusual weakness or fatigue, shortness of breath, chest pain, fast or irregular heartbeat, dizziness, swelling of the ankles, feet, or hands Hormone gland problems--headache, sensitivity to light, unusual weakness or fatigue, dizziness, fast or irregular heartbeat, increased sensitivity to cold or heat, excessive sweating, constipation, hair loss, increased thirst or amount of urine, tremors or shaking, irritability Infusion reactions--chest pain, shortness of breath or trouble breathing, feeling faint or lightheaded Kidney injury (glomerulonephritis)--decrease in the amount of urine, red or dark brown urine, foamy or bubbly urine, swelling of the ankles, hands, or feet Liver injury--right upper belly pain, loss of appetite, nausea, light-colored stool, dark yellow or brown urine, yellowing skin or eyes, unusual weakness or fatigue Pain, tingling, or   numbness in the hands or feet, muscle weakness, change in vision, confusion or trouble speaking, loss of balance or coordination, trouble walking, seizures Rash, fever, and swollen lymph nodes Redness, blistering, peeling, or loosening of the skin, including inside the mouth Sudden or severe stomach pain, bloody diarrhea, fever, nausea, vomiting Side effects that usually do not require medical attention (report these  to your care team if they continue or are bothersome): Bone, joint, or muscle pain Diarrhea Fatigue Loss of appetite Nausea Skin rash This list may not describe all possible side effects. Call your doctor for medical advice about side effects. You may report side effects to FDA at 1-800-FDA-1088. Where should I keep my medication? This medication is given in a hospital or clinic. It will not be stored at home. NOTE: This sheet is a summary. It may not cover all possible information. If you have questions about this medicine, talk to your doctor, pharmacist, or health care provider.  2023 Elsevier/Gold Standard (2021-10-01 00:00:00) Carboplatin Injection What is this medication? CARBOPLATIN (KAR boe pla tin) treats some types of cancer. It works by slowing down the growth of cancer cells. This medicine may be used for other purposes; ask your health care provider or pharmacist if you have questions. COMMON BRAND NAME(S): Paraplatin What should I tell my care team before I take this medication? They need to know if you have any of these conditions: Blood disorders Hearing problems Kidney disease Recent or ongoing radiation therapy An unusual or allergic reaction to carboplatin, cisplatin, other medications, foods, dyes, or preservatives Pregnant or trying to get pregnant Breast-feeding How should I use this medication? This medication is injected into a vein. It is given by your care team in a hospital or clinic setting. Talk to your care team about the use of this medication in children. Special care may be needed. Overdosage: If you think you have taken too much of this medicine contact a poison control center or emergency room at once. NOTE: This medicine is only for you. Do not share this medicine with others. What if I miss a dose? Keep appointments for follow-up doses. It is important not to miss your dose. Call your care team if you are unable to keep an appointment. What may  interact with this medication? Medications for seizures Some antibiotics, such as amikacin, gentamicin, neomycin, streptomycin, tobramycin Vaccines This list may not describe all possible interactions. Give your health care provider a list of all the medicines, herbs, non-prescription drugs, or dietary supplements you use. Also tell them if you smoke, drink alcohol, or use illegal drugs. Some items may interact with your medicine. What should I watch for while using this medication? Your condition will be monitored carefully while you are receiving this medication. You may need blood work while taking this medication. This medication may make you feel generally unwell. This is not uncommon, as chemotherapy can affect healthy cells as well as cancer cells. Report any side effects. Continue your course of treatment even though you feel ill unless your care team tells you to stop. In some cases, you may be given additional medications to help with side effects. Follow all directions for their use. This medication may increase your risk of getting an infection. Call your care team for advice if you get a fever, chills, sore throat, or other symptoms of a cold or flu. Do not treat yourself. Try to avoid being around people who are sick. Avoid taking medications that contain aspirin, acetaminophen, ibuprofen, naproxen,   or ketoprofen unless instructed by your care team. These medications may hide a fever. Be careful brushing or flossing your teeth or using a toothpick because you may get an infection or bleed more easily. If you have any dental work done, tell your dentist you are receiving this medication. Talk to your care team if you wish to become pregnant or think you might be pregnant. This medication can cause serious birth defects. Talk to your care team about effective forms of contraception. Do not breast-feed while taking this medication. What side effects may I notice from receiving this  medication? Side effects that you should report to your care team as soon as possible: Allergic reactions--skin rash, itching, hives, swelling of the face, lips, tongue, or throat Infection--fever, chills, cough, sore throat, wounds that don't heal, pain or trouble when passing urine, general feeling of discomfort or being unwell Low red blood cell level--unusual weakness or fatigue, dizziness, headache, trouble breathing Pain, tingling, or numbness in the hands or feet, muscle weakness, change in vision, confusion or trouble speaking, loss of balance or coordination, trouble walking, seizures Unusual bruising or bleeding Side effects that usually do not require medical attention (report to your care team if they continue or are bothersome): Hair loss Nausea Unusual weakness or fatigue Vomiting This list may not describe all possible side effects. Call your doctor for medical advice about side effects. You may report side effects to FDA at 1-800-FDA-1088. Where should I keep my medication? This medication is given in a hospital or clinic. It will not be stored at home. NOTE: This sheet is a summary. It may not cover all possible information. If you have questions about this medicine, talk to your doctor, pharmacist, or health care provider.  2023 Elsevier/Gold Standard (2007-07-21 00:00:00) Etoposide Injection What is this medication? ETOPOSIDE (e toe POE side) treats some types of cancer. It works by slowing down the growth of cancer cells. This medicine may be used for other purposes; ask your health care provider or pharmacist if you have questions. COMMON BRAND NAME(S): Etopophos, Toposar, VePesid What should I tell my care team before I take this medication? They need to know if you have any of these conditions: Infection Kidney disease Liver disease Low blood counts, such as low white cell, platelet, red cell counts An unusual or allergic reaction to etoposide, other medications,  foods, dyes, or preservatives If you or your partner are pregnant or trying to get pregnant Breastfeeding How should I use this medication? This medication is injected into a vein. It is given by your care team in a hospital or clinic setting. Talk to your care team about the use of this medication in children. Special care may be needed. Overdosage: If you think you have taken too much of this medicine contact a poison control center or emergency room at once. NOTE: This medicine is only for you. Do not share this medicine with others. What if I miss a dose? Keep appointments for follow-up doses. It is important not to miss your dose. Call your care team if you are unable to keep an appointment. What may interact with this medication? Warfarin This list may not describe all possible interactions. Give your health care provider a list of all the medicines, herbs, non-prescription drugs, or dietary supplements you use. Also tell them if you smoke, drink alcohol, or use illegal drugs. Some items may interact with your medicine. What should I watch for while using this medication? Your condition will be   monitored carefully while you are receiving this medication. This medication may make you feel generally unwell. This is not uncommon as chemotherapy can affect healthy cells as well as cancer cells. Report any side effects. Continue your course of treatment even though you feel ill unless your care team tells you to stop. This medication can cause serious side effects. To reduce the risk, your care team may give you other medications to take before receiving this one. Be sure to follow the directions from your care team. This medication may increase your risk of getting an infection. Call your care team for advice if you get a fever, chills, sore throat, or other symptoms of a cold or flu. Do not treat yourself. Try to avoid being around people who are sick. This medication may increase your risk to  bruise or bleed. Call your care team if you notice any unusual bleeding. Talk to your care team about your risk of cancer. You may be more at risk for certain types of cancers if you take this medication. Talk to your care team if you may be pregnant. Serious birth defects can occur if you take this medication during pregnancy and for 6 months after the last dose. You will need a negative pregnancy test before starting this medication. Contraception is recommended while taking this medication and for 6 months after the last dose. Your care team can help you find the option that works for you. If your partner can get pregnant, use a condom during sex while taking this medication and for 4 months after the last dose. Do not breastfeed while taking this medication. This medication may cause infertility. Talk to your care team if you are concerned about your fertility. What side effects may I notice from receiving this medication? Side effects that you should report to your care team as soon as possible: Allergic reactions--skin rash, itching, hives, swelling of the face, lips, tongue, or throat Infection--fever, chills, cough, sore throat, wounds that don't heal, pain or trouble when passing urine, general feeling of discomfort or being unwell Low red blood cell level--unusual weakness or fatigue, dizziness, headache, trouble breathing Unusual bruising or bleeding Side effects that usually do not require medical attention (report to your care team if they continue or are bothersome): Diarrhea Fatigue Hair loss Loss of appetite Nausea Vomiting This list may not describe all possible side effects. Call your doctor for medical advice about side effects. You may report side effects to FDA at 1-800-FDA-1088. Where should I keep my medication? This medication is given in a hospital or clinic. It will not be stored at home. NOTE: This sheet is a summary. It may not cover all possible information. If you  have questions about this medicine, talk to your doctor, pharmacist, or health care provider.  2023 Elsevier/Gold Standard (2007-07-21 00:00:00)   

## 2022-11-15 NOTE — Progress Notes (Signed)
Patient seen by Dr. Mohamed  Vitals are within treatment parameters.  Labs reviewed: and are within treatment parameters.  Per physician team, patient is ready for treatment and there are NO modifications to the treatment plan.  

## 2022-11-15 NOTE — Progress Notes (Signed)
Patient complaining of pain at port site. She states that a nurse was "training" on her and when they deeaccessed her port, the nurse "yanked the needle out" and her port has been tender since then. Port site was assessed and no redness, bruising, drainage or heat was noted. Patient denies fevers. Port was accessed with no issue, patient denied severe pain during needle insertion and good blood return was noted. Patient was advised to contact Dr. Ena Dawley office should she develop signs of infection such as increased pain, swelling, drainage, redness or fevers. Patient verbalized understanding and has no further questions at this time.

## 2022-11-15 NOTE — Progress Notes (Signed)
[facility] [phone]   [phone]  OFFICE PROGRESS NOTE  [name], MD [address] [place] [address]  DIAGNOSIS:  1) Extensive stage small cell lung cancer (T3, N3, M1 C) . She presented with large right lower lobe lung masses in addition to bulky right hilar and mediastinal lymphadenopathy as well as suspicious lytic bone lesion at L1. She was diagnosed in March 2024.  2) HIV diagnosed in March 2024   PRIOR THERAPY: None   CURRENT THERAPY: Systemic chemotherapy with carboplatin for AUC of 4 on day 1 and etoposide 80 Mg/M2 on days 1, 2 and 3 with Imfinzi 1500 Mg on day 1 every 3 weeks with Neulasta support on day 5. First dose September 14, 2022 . She is on dose reduced chemotherapy due to her HIV. Status post 2 cycles  INTERVAL HISTORY: [name] 67 y.o. female returns to the clinic today for follow-up visit.  The patient is feeling fine today with no concerning complaints.  She has improvement in her breathing and she is not using the home oxygen anymore.  She denied having any current chest pain, shortness of breath, cough or hemoptysis.  She has no nausea, vomiting, diarrhea or constipation.  She has no headache or visual changes.  She has been tolerating her systemic chemotherapy fairly well.  The patient had repeat CT scan of the chest, abdomen and pelvis as well as MRI of the brain performed few days ago but unfortunately the final report is still pending.  She is here for evaluation before starting cycle #3.  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  MEDICATIONS:  Current Outpatient Medications  Medication Sig Dispense Refill   Aspirin 325 MG CAPS Take 325 mg by mouth daily.      baclofen (LIORESAL) 10 MG tablet Take 1 tablet (10 mg total) by mouth 3 (three) times daily as needed for muscle spasms. 30 each 0   cholecalciferol (VITAMIN D3) 25 MCG (1000 UNIT) tablet Take 1,000 Units by mouth daily.     dolutegravir-lamiVUDine (DOVATO) 50-300 MG tablet Take 1 tablet by mouth daily. 30 tablet 11   folic acid (FOLVITE) 1 MG tablet Take 1 tablet (1 mg total) by mouth daily. 90 tablet 0   lidocaine-prilocaine (EMLA) cream Apply 1 Application topically as needed. 30 g 2   nicotine (NICODERM CQ) 7 mg/24hr patch Place 1 patch (7 mg total) onto the skin daily. 28 patch 0   thiamine (VITAMIN B-1) 100 MG tablet Take 1 tablet (100 mg total) by mouth daily. 90 tablet 0   diphenoxylate-atropine (LOMOTIL) 2.5-0.025 MG tablet Take 1 tablet by mouth 4 (four) times daily as needed for diarrhea or loose stools. (Patient not taking: Reported on 11/15/2022) 30 tablet 0   guaifenesin (ROBITUSSIN) 100 MG/5ML syrup Take 200 mg by mouth 3 (three) times daily as needed for cough. (Patient not taking: Reported on 09/21/2022)     nicotine polacrilex (NICORETTE) 4 MG gum Take 1 each (4 mg total) by mouth as needed for smoking cessation. 30 tablet 2   oxyCODONE-acetaminophen (PERCOCET/ROXICET) 5-325 MG tablet Take 1 tablet by mouth every 8 (eight) hours as needed for severe  pain. (Patient not taking: Reported on 11/15/2022) 30 tablet 0   prochlorperazine (COMPAZINE) 10 MG tablet Take 1 tablet (10 mg total) by mouth every 6 (six) hours as needed. (Patient not taking: Reported on 09/14/2022) 30 tablet 2   No current facility-administered medications for this visit.    SURGICAL HISTORY:  Past Surgical History:  Procedure Laterality Date   BRONCHIAL NEEDLE ASPIRATION BIOPSY  09/02/2022   Procedure: BRONCHIAL NEEDLE ASPIRATION BIOPSIES;  Surgeon: Josephine Igo, DO;  Location: [facility];  Service: Cardiopulmonary;;   IR IMAGING GUIDED PORT INSERTION  09/29/2022   MULTIPLE TOOTH EXTRACTIONS     ORIF ANKLE  FRACTURE  05/03/2012   Procedure: OPEN REDUCTION INTERNAL FIXATION (ORIF) ANKLE FRACTURE;  Surgeon: Toni Arthurs, MD;  Location: [facility] OR;  Service: Orthopedics;  Laterality: Left;   VIDEO BRONCHOSCOPY WITH ENDOBRONCHIAL ULTRASOUND Bilateral 09/02/2022   Procedure: VIDEO BRONCHOSCOPY WITH ENDOBRONCHIAL ULTRASOUND;  Surgeon: Josephine Igo, DO;  Location: [facility];  Service: Cardiopulmonary;  Laterality: Bilateral;    REVIEW OF SYSTEMS:  Constitutional: positive for fatigue Eyes: negative Ears, nose, mouth, throat, and face: negative Respiratory: negative Cardiovascular: negative Gastrointestinal: negative Genitourinary:negative Integument/breast: negative Hematologic/lymphatic: negative Musculoskeletal:negative Neurological: negative Behavioral/Psych: negative Endocrine: negative Allergic/Immunologic: negative   PHYSICAL EXAMINATION: General appearance: alert, cooperative, fatigued, and no distress Head: Normocephalic, without obvious abnormality, atraumatic Neck: no adenopathy, no JVD, supple, symmetrical, trachea midline, and thyroid not enlarged, symmetric, no tenderness/mass/nodules Lymph nodes: Cervical, supraclavicular, and axillary nodes normal. Resp: clear to auscultation bilaterally Back: symmetric, no curvature. ROM normal. No CVA tenderness. Cardio: regular rate and rhythm, S1, S2 normal, no murmur, click, rub or gallop GI: soft, non-tender; bowel sounds normal; no masses,  no organomegaly Extremities: extremities normal, atraumatic, no cyanosis or edema Neurologic: Alert and oriented X 3, normal strength and tone. Normal symmetric reflexes. Normal coordination and gait  ECOG PERFORMANCE STATUS: 1 - Symptomatic but completely ambulatory  Blood pressure 116/67, pulse 86, temperature 98.2 F (36.8 C), temperature source Oral, resp. rate 18, height 5\' 9"  (1.753 m), weight 138 lb 9.6 oz (62.9 kg), SpO2 100 %.  LABORATORY DATA: Lab Results  Component Value Date   WBC  4.8 11/15/2022   HGB 9.9 (L) 11/15/2022   HCT 30.7 (L) 11/15/2022   MCV 102.0 (H) 11/15/2022   PLT 300 11/15/2022      Chemistry      Component Value Date/Time   NA 139 11/15/2022 0954   K 3.6 11/15/2022 0954   CL 109 11/15/2022 0954   CO2 26 11/15/2022 0954   BUN 16 11/15/2022 0954   CREATININE 0.67 11/15/2022 0954   CREATININE 0.53 09/21/2022 0942      Component Value Date/Time   CALCIUM 9.4 11/15/2022 0954   ALKPHOS 76 11/15/2022 0954   AST 13 (L) 11/15/2022 0954   ALT 10 11/15/2022 0954   BILITOT 0.2 (L) 11/15/2022 0954       RADIOGRAPHIC STUDIES: No results found.  ASSESSMENT AND PLAN: This is a very pleasant 67 years old African-American female with Extensive stage small cell lung cancer (T3, N3, M1 C) . She presented with large right lower lobe lung masses in addition to bulky right hilar and mediastinal lymphadenopathy as well as suspicious lytic bone lesion at L1. She was diagnosed in March 2024.  The patient also has HIV diagnosed in March 2024 She is currently undergoing systemic chemotherapy with carboplatin for AUC of 4 on day 1 and etoposide 80 Mg/M2 on days 1, 2 and 3 with  Imfinzi 1500 Mg on day 1 every 3 weeks with Neulasta support on day 5. First dose September 14, 2022 . She is on dose reduced chemotherapy due to her HIV. Status post 2 cycles She has been tolerating this treatment fairly well. The patient had repeat CT scan of the chest, abdomen pelvis as well as MRI of the brain performed recently.  I personally and independently reviewed the scan images and discussed the results with the patient today. The final report is still pending but reviewing the imaging studies showed significant improvement of her disease. I recommended for the patient to continue her current systemic chemotherapy and she will proceed with cycle #3 today. I will see her back for follow-up visit in 3 weeks for evaluation before the next cycle of her treatment. The patient was advised to  call immediately if she has any other concerning symptoms in the interval. The patient voices understanding of current disease status and treatment options and is in agreement with the current care plan.  All questions were answered. The patient knows to call the clinic with any problems, questions or concerns. We can certainly see the patient much sooner if necessary.  The total time spent in the appointment was 30 minutes.  Disclaimer: This note was dictated with voice recognition software. Similar sounding words can inadvertently be transcribed and may not be corrected upon review.

## 2022-11-15 NOTE — Telephone Encounter (Signed)
Patient complains that porta cath site is red and irritated. No drainage or fever. Started after last infusion. Has appointment with Oncology tomorrow morning at 10:00. Advised to follow up with them for evaluation. May have infected site/port/cellulitis ? Will need in person evaluation. Advised if sx worsen or develop fever or severe pain to seek ER evaluation  Please contact office for sooner follow up if symptoms do not improve or worsen or seek emergency care

## 2022-11-15 NOTE — Patient Instructions (Signed)

## 2022-11-16 ENCOUNTER — Encounter: Payer: Self-pay | Admitting: Physician Assistant

## 2022-11-16 ENCOUNTER — Inpatient Hospital Stay: Payer: Medicare HMO

## 2022-11-16 ENCOUNTER — Telehealth: Payer: Self-pay | Admitting: Medical Oncology

## 2022-11-16 ENCOUNTER — Encounter: Payer: Self-pay | Admitting: Internal Medicine

## 2022-11-16 ENCOUNTER — Other Ambulatory Visit (HOSPITAL_COMMUNITY): Payer: Self-pay

## 2022-11-16 ENCOUNTER — Ambulatory Visit: Payer: Medicare HMO | Admitting: Internal Medicine

## 2022-11-16 ENCOUNTER — Other Ambulatory Visit: Payer: Self-pay

## 2022-11-16 VITALS — BP 122/70 | HR 88 | Temp 98.8°F | Resp 18

## 2022-11-16 DIAGNOSIS — Z5189 Encounter for other specified aftercare: Secondary | ICD-10-CM | POA: Diagnosis not present

## 2022-11-16 DIAGNOSIS — Z5111 Encounter for antineoplastic chemotherapy: Secondary | ICD-10-CM | POA: Diagnosis not present

## 2022-11-16 DIAGNOSIS — Z21 Asymptomatic human immunodeficiency virus [HIV] infection status: Secondary | ICD-10-CM | POA: Diagnosis not present

## 2022-11-16 DIAGNOSIS — R930 Abnormal findings on diagnostic imaging of skull and head, not elsewhere classified: Secondary | ICD-10-CM | POA: Diagnosis not present

## 2022-11-16 DIAGNOSIS — C3431 Malignant neoplasm of lower lobe, right bronchus or lung: Secondary | ICD-10-CM | POA: Diagnosis not present

## 2022-11-16 DIAGNOSIS — C3490 Malignant neoplasm of unspecified part of unspecified bronchus or lung: Secondary | ICD-10-CM

## 2022-11-16 DIAGNOSIS — Z5112 Encounter for antineoplastic immunotherapy: Secondary | ICD-10-CM | POA: Diagnosis not present

## 2022-11-16 MED ORDER — SODIUM CHLORIDE 0.9 % IV SOLN
10.0000 mg | Freq: Once | INTRAVENOUS | Status: AC
  Administered 2022-11-16: 10 mg via INTRAVENOUS
  Filled 2022-11-16: qty 10

## 2022-11-16 MED ORDER — SODIUM CHLORIDE 0.9 % IV SOLN
80.0000 mg/m2 | Freq: Once | INTRAVENOUS | Status: AC
  Administered 2022-11-16: 138 mg via INTRAVENOUS
  Filled 2022-11-16: qty 6.9

## 2022-11-16 MED ORDER — HEPARIN SOD (PORK) LOCK FLUSH 100 UNIT/ML IV SOLN
500.0000 [IU] | Freq: Once | INTRAVENOUS | Status: AC | PRN
  Administered 2022-11-16: 500 [IU]

## 2022-11-16 MED ORDER — SODIUM CHLORIDE 0.9% FLUSH
10.0000 mL | INTRAVENOUS | Status: DC | PRN
  Administered 2022-11-16: 10 mL

## 2022-11-16 MED ORDER — SODIUM CHLORIDE 0.9 % IV SOLN: Freq: Once | INTRAVENOUS | Status: AC

## 2022-11-16 MED FILL — Dexamethasone Sodium Phosphate Inj 100 MG/10ML: INTRAMUSCULAR | Qty: 1 | Status: AC

## 2022-11-16 NOTE — Patient Instructions (Signed)
[facility]  Discharge Instructions: Thank you for choosing [facility] to provide your oncology and hematology care.   If you have a lab appointment with the Cancer Center, please go directly to the Cancer Center and check in at the registration area.   Wear comfortable clothing and clothing appropriate for easy access to any Portacath or PICC line.   We strive to give you quality time with your provider. You may need to reschedule your appointment if you arrive late (15 or more minutes).  Arriving late affects you and other patients whose appointments are after yours.  Also, if you miss three or more appointments without notifying the office, you may be dismissed from the clinic at the provider's discretion.      For prescription refill requests, have your pharmacy contact our office and allow 72 hours for refills to be completed.    Today you received the following chemotherapy and/or immunotherapy agents; Etoposide      To help prevent nausea and vomiting after your treatment, we encourage you to take your nausea medication as directed.  BELOW ARE SYMPTOMS THAT SHOULD BE REPORTED IMMEDIATELY: *FEVER GREATER THAN 100.4 F (38 C) OR HIGHER *CHILLS OR SWEATING *NAUSEA AND VOMITING THAT IS NOT CONTROLLED WITH YOUR NAUSEA MEDICATION *UNUSUAL SHORTNESS OF BREATH *UNUSUAL BRUISING OR BLEEDING *URINARY PROBLEMS (pain or burning when urinating, or frequent urination) *BOWEL PROBLEMS (unusual diarrhea, constipation, pain near the anus) TENDERNESS IN MOUTH AND THROAT WITH OR WITHOUT PRESENCE OF ULCERS (sore throat, sores in mouth, or a toothache) UNUSUAL RASH, SWELLING OR PAIN  UNUSUAL VAGINAL DISCHARGE OR ITCHING   Items with * indicate a potential emergency and should be followed up as soon as possible or go to the Emergency Department if any problems should occur.  Please show the CHEMOTHERAPY ALERT CARD or IMMUNOTHERAPY ALERT CARD at  check-in to the Emergency Department and triage nurse.  Should you have questions after your visit or need to cancel or reschedule your appointment, please contact [facility]  Dept: [phone]  and follow the prompts.  Office hours are 8:00 a.m. to 4:30 p.m. Monday - Friday. Please note that voicemails left after 4:00 p.m. may not be returned until the following business day.  We are closed weekends and major holidays. You have access to a nurse at all times for urgent questions. Please call the main number to the clinic Dept: [phone] and follow the prompts.   For any non-urgent questions, you may also contact your provider using MyChart. We now offer e-Visits for anyone 18 and older to request care online for non-urgent symptoms. For details visit [email or web address].   Also download the MyChart app! Go to the app store, search "MyChart", open the app, select [facility], and log in with your MyChart username and password.   

## 2022-11-16 NOTE — Telephone Encounter (Signed)
I told rep at [employer] that Dr Arbutus Ped does not treat incontinence and to contact pts PCP.

## 2022-11-17 ENCOUNTER — Other Ambulatory Visit: Payer: Self-pay

## 2022-11-17 ENCOUNTER — Encounter: Payer: Self-pay | Admitting: Nutrition

## 2022-11-17 ENCOUNTER — Inpatient Hospital Stay: Payer: Medicare HMO

## 2022-11-17 VITALS — BP 103/68 | HR 87 | Temp 98.1°F | Resp 18

## 2022-11-17 DIAGNOSIS — R930 Abnormal findings on diagnostic imaging of skull and head, not elsewhere classified: Secondary | ICD-10-CM | POA: Diagnosis not present

## 2022-11-17 DIAGNOSIS — Z21 Asymptomatic human immunodeficiency virus [HIV] infection status: Secondary | ICD-10-CM | POA: Diagnosis not present

## 2022-11-17 DIAGNOSIS — C3431 Malignant neoplasm of lower lobe, right bronchus or lung: Secondary | ICD-10-CM | POA: Diagnosis not present

## 2022-11-17 DIAGNOSIS — Z5189 Encounter for other specified aftercare: Secondary | ICD-10-CM | POA: Diagnosis not present

## 2022-11-17 DIAGNOSIS — Z5112 Encounter for antineoplastic immunotherapy: Secondary | ICD-10-CM | POA: Diagnosis not present

## 2022-11-17 DIAGNOSIS — C3490 Malignant neoplasm of unspecified part of unspecified bronchus or lung: Secondary | ICD-10-CM

## 2022-11-17 DIAGNOSIS — Z5111 Encounter for antineoplastic chemotherapy: Secondary | ICD-10-CM | POA: Diagnosis not present

## 2022-11-17 MED ORDER — SODIUM CHLORIDE 0.9% FLUSH
10.0000 mL | INTRAVENOUS | Status: DC | PRN
  Administered 2022-11-17: 10 mL

## 2022-11-17 MED ORDER — SODIUM CHLORIDE 0.9 % IV SOLN: Freq: Once | INTRAVENOUS | Status: AC

## 2022-11-17 MED ORDER — HEPARIN SOD (PORK) LOCK FLUSH 100 UNIT/ML IV SOLN
500.0000 [IU] | Freq: Once | INTRAVENOUS | Status: AC | PRN
  Administered 2022-11-17: 500 [IU]

## 2022-11-17 MED ORDER — SODIUM CHLORIDE 0.9 % IV SOLN
10.0000 mg | Freq: Once | INTRAVENOUS | Status: AC
  Administered 2022-11-17: 10 mg via INTRAVENOUS
  Filled 2022-11-17: qty 10

## 2022-11-17 MED ORDER — SODIUM CHLORIDE 0.9 % IV SOLN
80.0000 mg/m2 | Freq: Once | INTRAVENOUS | Status: AC
  Administered 2022-11-17: 138 mg via INTRAVENOUS
  Filled 2022-11-17: qty 6.9

## 2022-11-17 NOTE — Progress Notes (Signed)
Provided one complimentary case of Ensure Plus HP due to poor appetite and history of wt loss.

## 2022-11-17 NOTE — Patient Instructions (Signed)
[facility]  Discharge Instructions: Thank you for choosing [facility] to provide your oncology and hematology care.   If you have a lab appointment with the Cancer Center, please go directly to the Cancer Center and check in at the registration area.   Wear comfortable clothing and clothing appropriate for easy access to any Portacath or PICC line.   We strive to give you quality time with your provider. You may need to reschedule your appointment if you arrive late (15 or more minutes).  Arriving late affects you and other patients whose appointments are after yours.  Also, if you miss three or more appointments without notifying the office, you may be dismissed from the clinic at the provider's discretion.      For prescription refill requests, have your pharmacy contact our office and allow 72 hours for refills to be completed.    Today you received the following chemotherapy and/or immunotherapy agents; Etoposide      To help prevent nausea and vomiting after your treatment, we encourage you to take your nausea medication as directed.  BELOW ARE SYMPTOMS THAT SHOULD BE REPORTED IMMEDIATELY: *FEVER GREATER THAN 100.4 F (38 C) OR HIGHER *CHILLS OR SWEATING *NAUSEA AND VOMITING THAT IS NOT CONTROLLED WITH YOUR NAUSEA MEDICATION *UNUSUAL SHORTNESS OF BREATH *UNUSUAL BRUISING OR BLEEDING *URINARY PROBLEMS (pain or burning when urinating, or frequent urination) *BOWEL PROBLEMS (unusual diarrhea, constipation, pain near the anus) TENDERNESS IN MOUTH AND THROAT WITH OR WITHOUT PRESENCE OF ULCERS (sore throat, sores in mouth, or a toothache) UNUSUAL RASH, SWELLING OR PAIN  UNUSUAL VAGINAL DISCHARGE OR ITCHING   Items with * indicate a potential emergency and should be followed up as soon as possible or go to the Emergency Department if any problems should occur.  Please show the CHEMOTHERAPY ALERT CARD or IMMUNOTHERAPY ALERT CARD at  check-in to the Emergency Department and triage nurse.  Should you have questions after your visit or need to cancel or reschedule your appointment, please contact [facility]  Dept: [phone]  and follow the prompts.  Office hours are 8:00 a.m. to 4:30 p.m. Monday - Friday. Please note that voicemails left after 4:00 p.m. may not be returned until the following business day.  We are closed weekends and major holidays. You have access to a nurse at all times for urgent questions. Please call the main number to the clinic Dept: [phone] and follow the prompts.   For any non-urgent questions, you may also contact your provider using MyChart. We now offer e-Visits for anyone 18 and older to request care online for non-urgent symptoms. For details visit [email or web address].   Also download the MyChart app! Go to the app store, search "MyChart", open the app, select [facility], and log in with your MyChart username and password.   

## 2022-11-18 ENCOUNTER — Ambulatory Visit (INDEPENDENT_AMBULATORY_CARE_PROVIDER_SITE_OTHER): Payer: Medicare HMO | Admitting: Internal Medicine

## 2022-11-18 ENCOUNTER — Other Ambulatory Visit: Payer: Self-pay

## 2022-11-18 ENCOUNTER — Encounter: Payer: Self-pay | Admitting: Internal Medicine

## 2022-11-18 ENCOUNTER — Telehealth: Payer: Self-pay | Admitting: Family Medicine

## 2022-11-18 VITALS — BP 137/75 | HR 89 | Temp 98.5°F | Wt 140.8 lb

## 2022-11-18 DIAGNOSIS — B20 Human immunodeficiency virus [HIV] disease: Secondary | ICD-10-CM | POA: Diagnosis not present

## 2022-11-18 DIAGNOSIS — Z21 Asymptomatic human immunodeficiency virus [HIV] infection status: Secondary | ICD-10-CM

## 2022-11-18 DIAGNOSIS — R6889 Other general symptoms and signs: Secondary | ICD-10-CM | POA: Diagnosis not present

## 2022-11-18 NOTE — Telephone Encounter (Signed)
PCP note. I am currently rotating with Dr. Luciana Axe on ID elective at [facility]. Patient presented today for ID follow up with Dr. Luciana Axe. She is doing well on her Dovato.   Let her know that I am graduating at the end of month and that she would get a letter from [facility] with the name of her new PCP. Re-assured her that we would be there for her once she was more on surveillance schedule with her oncologist and infectious disease doctors.   She looks much better than when I first met her in the ED earlier this spring. Happy to see this.   [name], MD

## 2022-11-18 NOTE — Assessment & Plan Note (Signed)
She is doing well on Dovato and no changes indicated.  Will recheck her viral load, though lab closed now so will reschedule lab.  Follow up in 3 months.    I have personally spent 20 minutes involved in face-to-face and non-face-to-face activities for this patient on the day of the visit. Professional time spent includes the following activities: Preparing to see the patient (review of tests), Obtaining and/or reviewing separately obtained history (admission/discharge record), Performing a medically appropriate examination and/or evaluation , Ordering medications/tests/procedures, referring and communicating with other health care professionals, Documenting clinical information in the EMR, Independently interpreting results (not separately reported), Communicating results to the patient/family/caregiver, Counseling and educating the patient/family/caregiver and Care coordination (not separately reported).

## 2022-11-18 NOTE — Progress Notes (Signed)
   Subjective:    Patient ID: [name], female    DOB: 11-02-1955, 67 y.o.   MRN: [id number]  HPI [name] is here for follow up of HIV She was recently diagnosed and starting on Dovato in April.  Initial CD4 is 333 and viral load 3,090 and started medication then.  No issues with getting or taking.     Review of Systems  Constitutional:  Negative for fatigue.  Gastrointestinal:  Negative for diarrhea.  Skin:  Negative for rash.       Objective:   Physical Exam Eyes:     General: No scleral icterus. Pulmonary:     Effort: Pulmonary effort is normal.  Neurological:     Mental Status: She is alert.   SH: no tobacco        Assessment & Plan:

## 2022-11-19 ENCOUNTER — Inpatient Hospital Stay: Payer: Medicare HMO

## 2022-11-19 VITALS — BP 123/76 | HR 95 | Temp 98.2°F

## 2022-11-19 DIAGNOSIS — C3490 Malignant neoplasm of unspecified part of unspecified bronchus or lung: Secondary | ICD-10-CM

## 2022-11-19 DIAGNOSIS — Z21 Asymptomatic human immunodeficiency virus [HIV] infection status: Secondary | ICD-10-CM | POA: Diagnosis not present

## 2022-11-19 DIAGNOSIS — Z5189 Encounter for other specified aftercare: Secondary | ICD-10-CM | POA: Diagnosis not present

## 2022-11-19 DIAGNOSIS — R930 Abnormal findings on diagnostic imaging of skull and head, not elsewhere classified: Secondary | ICD-10-CM | POA: Diagnosis not present

## 2022-11-19 DIAGNOSIS — Z5112 Encounter for antineoplastic immunotherapy: Secondary | ICD-10-CM | POA: Diagnosis not present

## 2022-11-19 DIAGNOSIS — C3431 Malignant neoplasm of lower lobe, right bronchus or lung: Secondary | ICD-10-CM | POA: Diagnosis not present

## 2022-11-19 DIAGNOSIS — Z5111 Encounter for antineoplastic chemotherapy: Secondary | ICD-10-CM | POA: Diagnosis not present

## 2022-11-19 MED ORDER — PEGFILGRASTIM-CBQV 6 MG/0.6ML ~~LOC~~ SOSY
6.0000 mg | PREFILLED_SYRINGE | Freq: Once | SUBCUTANEOUS | Status: AC
  Administered 2022-11-19: 6 mg via SUBCUTANEOUS
  Filled 2022-11-19: qty 0.6

## 2022-11-22 ENCOUNTER — Inpatient Hospital Stay: Payer: Medicare HMO

## 2022-11-22 ENCOUNTER — Other Ambulatory Visit: Payer: Self-pay | Admitting: Physician Assistant

## 2022-11-22 DIAGNOSIS — Z5189 Encounter for other specified aftercare: Secondary | ICD-10-CM | POA: Diagnosis not present

## 2022-11-22 DIAGNOSIS — C3431 Malignant neoplasm of lower lobe, right bronchus or lung: Secondary | ICD-10-CM | POA: Diagnosis not present

## 2022-11-22 DIAGNOSIS — Z95828 Presence of other vascular implants and grafts: Secondary | ICD-10-CM

## 2022-11-22 DIAGNOSIS — Z21 Asymptomatic human immunodeficiency virus [HIV] infection status: Secondary | ICD-10-CM | POA: Diagnosis not present

## 2022-11-22 DIAGNOSIS — Z5111 Encounter for antineoplastic chemotherapy: Secondary | ICD-10-CM | POA: Diagnosis not present

## 2022-11-22 DIAGNOSIS — C3490 Malignant neoplasm of unspecified part of unspecified bronchus or lung: Secondary | ICD-10-CM

## 2022-11-22 DIAGNOSIS — R930 Abnormal findings on diagnostic imaging of skull and head, not elsewhere classified: Secondary | ICD-10-CM | POA: Diagnosis not present

## 2022-11-22 DIAGNOSIS — Z5112 Encounter for antineoplastic immunotherapy: Secondary | ICD-10-CM | POA: Diagnosis not present

## 2022-11-22 LAB — CBC WITH DIFFERENTIAL (CANCER CENTER ONLY)
Abs Immature Granulocytes: 0.6 10*3/uL — ABNORMAL HIGH (ref 0.00–0.07)
Basophils Absolute: 0.2 10*3/uL — ABNORMAL HIGH (ref 0.0–0.1)
Basophils Relative: 1 %
Eosinophils Absolute: 0.1 10*3/uL (ref 0.0–0.5)
Eosinophils Relative: 0 %
HCT: 26.4 % — ABNORMAL LOW (ref 36.0–46.0)
Hemoglobin: 9.1 g/dL — ABNORMAL LOW (ref 12.0–15.0)
Immature Granulocytes: 2 %
Lymphocytes Relative: 5 %
Lymphs Abs: 1.6 10*3/uL (ref 0.7–4.0)
MCH: 34.6 pg — ABNORMAL HIGH (ref 26.0–34.0)
MCHC: 34.5 g/dL (ref 30.0–36.0)
MCV: 100.4 fL — ABNORMAL HIGH (ref 80.0–100.0)
Monocytes Absolute: 0.5 10*3/uL (ref 0.1–1.0)
Monocytes Relative: 2 %
Neutro Abs: 27.1 10*3/uL — ABNORMAL HIGH (ref 1.7–7.7)
Neutrophils Relative %: 90 %
Platelet Count: 316 10*3/uL (ref 150–400)
RBC: 2.63 MIL/uL — ABNORMAL LOW (ref 3.87–5.11)
RDW: 15.9 % — ABNORMAL HIGH (ref 11.5–15.5)
Smear Review: NORMAL
WBC Count: 30.1 10*3/uL — ABNORMAL HIGH (ref 4.0–10.5)
nRBC: 0 % (ref 0.0–0.2)

## 2022-11-22 LAB — CMP (CANCER CENTER ONLY)
ALT: 9 U/L (ref 0–44)
AST: 13 U/L — ABNORMAL LOW (ref 15–41)
Albumin: 4 g/dL (ref 3.5–5.0)
Alkaline Phosphatase: 111 U/L (ref 38–126)
Anion gap: 5 (ref 5–15)
BUN: 14 mg/dL (ref 8–23)
CO2: 25 mmol/L (ref 22–32)
Calcium: 9.7 mg/dL (ref 8.9–10.3)
Chloride: 105 mmol/L (ref 98–111)
Creatinine: 0.67 mg/dL (ref 0.44–1.00)
GFR, Estimated: >60 mL/min (ref >60)
Glucose, Bld: 92 mg/dL (ref 70–99)
Potassium: 3.9 mmol/L (ref 3.5–5.1)
Sodium: 135 mmol/L (ref 135–145)
Total Bilirubin: 0.4 mg/dL (ref 0.3–1.2)
Total Protein: 8.7 g/dL — ABNORMAL HIGH (ref 6.5–8.1)

## 2022-11-22 LAB — SAMPLE TO BLOOD BANK

## 2022-11-22 MED ORDER — HEPARIN SOD (PORK) LOCK FLUSH 100 UNIT/ML IV SOLN
500.0000 [IU] | Freq: Once | INTRAVENOUS | Status: AC
  Administered 2022-11-22: 500 [IU]

## 2022-11-22 MED ORDER — SODIUM CHLORIDE 0.9% FLUSH
10.0000 mL | Freq: Once | INTRAVENOUS | Status: AC
  Administered 2022-11-22: 10 mL

## 2022-11-24 ENCOUNTER — Other Ambulatory Visit: Payer: Self-pay

## 2022-11-24 DIAGNOSIS — Z21 Asymptomatic human immunodeficiency virus [HIV] infection status: Secondary | ICD-10-CM

## 2022-11-24 NOTE — Progress Notes (Signed)
The ASCVD Risk score (Arnett DK, et al., 2019) failed to calculate for the following reasons:   The patient has a prior MI or stroke diagnosis  Sandie Ano, RN

## 2022-11-25 ENCOUNTER — Other Ambulatory Visit: Payer: Self-pay

## 2022-11-25 ENCOUNTER — Other Ambulatory Visit: Payer: Medicare HMO

## 2022-11-25 DIAGNOSIS — R6889 Other general symptoms and signs: Secondary | ICD-10-CM | POA: Diagnosis not present

## 2022-11-25 DIAGNOSIS — Z21 Asymptomatic human immunodeficiency virus [HIV] infection status: Secondary | ICD-10-CM

## 2022-11-28 LAB — T-HELPER CELLS (CD4) COUNT (NOT AT ARMC)
Absolute CD4: 492 cells/uL (ref 490–1740)
CD4 T Helper %: 41 % (ref 30–61)
Total lymphocyte count: 1203 cells/uL (ref 850–3900)

## 2022-11-28 LAB — HIV-1 RNA QUANT-NO REFLEX-BLD
HIV 1 RNA Quant: <20 Copies/mL — ABNORMAL HIGH
HIV-1 RNA Quant, Log: <1.3 Log cps/mL — ABNORMAL HIGH

## 2022-11-29 ENCOUNTER — Other Ambulatory Visit: Payer: Self-pay

## 2022-11-29 ENCOUNTER — Other Ambulatory Visit: Payer: 59

## 2022-11-29 ENCOUNTER — Other Ambulatory Visit (HOSPITAL_COMMUNITY): Payer: Self-pay

## 2022-11-29 ENCOUNTER — Encounter: Payer: Self-pay | Admitting: Internal Medicine

## 2022-11-29 ENCOUNTER — Inpatient Hospital Stay: Payer: Medicare HMO

## 2022-11-29 ENCOUNTER — Encounter: Payer: Self-pay | Admitting: Physician Assistant

## 2022-11-29 ENCOUNTER — Telehealth: Payer: Self-pay | Admitting: Medical Oncology

## 2022-11-29 DIAGNOSIS — Z5112 Encounter for antineoplastic immunotherapy: Secondary | ICD-10-CM | POA: Diagnosis not present

## 2022-11-29 DIAGNOSIS — C3431 Malignant neoplasm of lower lobe, right bronchus or lung: Secondary | ICD-10-CM | POA: Diagnosis not present

## 2022-11-29 DIAGNOSIS — C3490 Malignant neoplasm of unspecified part of unspecified bronchus or lung: Secondary | ICD-10-CM

## 2022-11-29 DIAGNOSIS — Z5189 Encounter for other specified aftercare: Secondary | ICD-10-CM | POA: Diagnosis not present

## 2022-11-29 DIAGNOSIS — R930 Abnormal findings on diagnostic imaging of skull and head, not elsewhere classified: Secondary | ICD-10-CM | POA: Diagnosis not present

## 2022-11-29 DIAGNOSIS — Z95828 Presence of other vascular implants and grafts: Secondary | ICD-10-CM

## 2022-11-29 DIAGNOSIS — Z5111 Encounter for antineoplastic chemotherapy: Secondary | ICD-10-CM

## 2022-11-29 DIAGNOSIS — Z21 Asymptomatic human immunodeficiency virus [HIV] infection status: Secondary | ICD-10-CM | POA: Diagnosis not present

## 2022-11-29 LAB — CMP (CANCER CENTER ONLY)
ALT: 8 U/L (ref 0–44)
AST: 11 U/L — ABNORMAL LOW (ref 15–41)
Albumin: 3.5 g/dL (ref 3.5–5.0)
Alkaline Phosphatase: 94 U/L (ref 38–126)
Anion gap: 4 — ABNORMAL LOW (ref 5–15)
BUN: 7 mg/dL — ABNORMAL LOW (ref 8–23)
CO2: 25 mmol/L (ref 22–32)
Calcium: 9.5 mg/dL (ref 8.9–10.3)
Chloride: 107 mmol/L (ref 98–111)
Creatinine: 0.53 mg/dL (ref 0.44–1.00)
GFR, Estimated: >60 mL/min (ref >60)
Glucose, Bld: 110 mg/dL — ABNORMAL HIGH (ref 70–99)
Potassium: 3.4 mmol/L — ABNORMAL LOW (ref 3.5–5.1)
Sodium: 136 mmol/L (ref 135–145)
Total Bilirubin: 0.3 mg/dL (ref 0.3–1.2)
Total Protein: 7.8 g/dL (ref 6.5–8.1)

## 2022-11-29 LAB — CBC WITH DIFFERENTIAL (CANCER CENTER ONLY)
Abs Immature Granulocytes: 0.04 10*3/uL (ref 0.00–0.07)
Basophils Absolute: 0 10*3/uL (ref 0.0–0.1)
Basophils Relative: 1 %
Eosinophils Absolute: 0.1 10*3/uL (ref 0.0–0.5)
Eosinophils Relative: 1 %
HCT: 28.6 % — ABNORMAL LOW (ref 36.0–46.0)
Hemoglobin: 9.3 g/dL — ABNORMAL LOW (ref 12.0–15.0)
Immature Granulocytes: 1 %
Lymphocytes Relative: 13 %
Lymphs Abs: 1.1 10*3/uL (ref 0.7–4.0)
MCH: 33.6 pg (ref 26.0–34.0)
MCHC: 32.5 g/dL (ref 30.0–36.0)
MCV: 103.2 fL — ABNORMAL HIGH (ref 80.0–100.0)
Monocytes Absolute: 0.5 10*3/uL (ref 0.1–1.0)
Monocytes Relative: 6 %
Neutro Abs: 6.8 10*3/uL (ref 1.7–7.7)
Neutrophils Relative %: 78 %
Platelet Count: 240 10*3/uL (ref 150–400)
RBC: 2.77 MIL/uL — ABNORMAL LOW (ref 3.87–5.11)
RDW: 15.9 % — ABNORMAL HIGH (ref 11.5–15.5)
WBC Count: 8.6 10*3/uL (ref 4.0–10.5)
nRBC: 0 % (ref 0.0–0.2)

## 2022-11-29 LAB — SAMPLE TO BLOOD BANK

## 2022-11-29 MED ORDER — HEPARIN SOD (PORK) LOCK FLUSH 100 UNIT/ML IV SOLN
500.0000 [IU] | Freq: Once | INTRAVENOUS | Status: AC
  Administered 2022-11-29: 500 [IU]

## 2022-11-29 MED ORDER — SODIUM CHLORIDE 0.9% FLUSH
10.0000 mL | Freq: Once | INTRAVENOUS | Status: AC
  Administered 2022-11-29: 10 mL

## 2022-11-29 MED ORDER — FOLIC ACID 1 MG PO TABS
1.0000 mg | ORAL_TABLET | Freq: Every day | ORAL | 0 refills | Status: DC
Start: 2022-11-29 — End: 2023-07-18
  Filled 2022-11-29 – 2022-12-21 (×5): qty 90, 90d supply, fill #0

## 2022-11-29 NOTE — Telephone Encounter (Signed)
Refill requested for Folic acid. Send to [facility] .

## 2022-11-30 ENCOUNTER — Telehealth: Payer: Self-pay

## 2022-11-30 ENCOUNTER — Other Ambulatory Visit: Payer: Self-pay

## 2022-11-30 NOTE — Telephone Encounter (Signed)
Called Ros[facility] to relay that her recent lab work shows her viral load is now undetectable and that her CD4 count has increased to 492.   Sandie Ano, RN

## 2022-12-05 ENCOUNTER — Other Ambulatory Visit: Payer: Self-pay

## 2022-12-06 ENCOUNTER — Inpatient Hospital Stay: Payer: Medicare HMO

## 2022-12-06 ENCOUNTER — Other Ambulatory Visit: Payer: Self-pay

## 2022-12-06 ENCOUNTER — Encounter: Payer: Self-pay | Admitting: Physician Assistant

## 2022-12-06 ENCOUNTER — Inpatient Hospital Stay (HOSPITAL_BASED_OUTPATIENT_CLINIC_OR_DEPARTMENT_OTHER): Payer: Medicare HMO | Admitting: Internal Medicine

## 2022-12-06 ENCOUNTER — Encounter: Payer: Self-pay | Admitting: Internal Medicine

## 2022-12-06 ENCOUNTER — Other Ambulatory Visit: Payer: 59

## 2022-12-06 ENCOUNTER — Other Ambulatory Visit (HOSPITAL_COMMUNITY): Payer: Self-pay

## 2022-12-06 VITALS — BP 128/74 | HR 109 | Temp 97.3°F | Resp 16 | Ht 69.0 in | Wt 137.2 lb

## 2022-12-06 VITALS — BP 112/70 | HR 81 | Temp 98.3°F | Resp 16

## 2022-12-06 DIAGNOSIS — C3490 Malignant neoplasm of unspecified part of unspecified bronchus or lung: Secondary | ICD-10-CM

## 2022-12-06 DIAGNOSIS — Z5112 Encounter for antineoplastic immunotherapy: Secondary | ICD-10-CM | POA: Diagnosis not present

## 2022-12-06 DIAGNOSIS — Z5111 Encounter for antineoplastic chemotherapy: Secondary | ICD-10-CM | POA: Diagnosis not present

## 2022-12-06 DIAGNOSIS — Z5189 Encounter for other specified aftercare: Secondary | ICD-10-CM | POA: Diagnosis not present

## 2022-12-06 DIAGNOSIS — Z21 Asymptomatic human immunodeficiency virus [HIV] infection status: Secondary | ICD-10-CM | POA: Diagnosis not present

## 2022-12-06 DIAGNOSIS — C3431 Malignant neoplasm of lower lobe, right bronchus or lung: Secondary | ICD-10-CM | POA: Diagnosis not present

## 2022-12-06 DIAGNOSIS — R930 Abnormal findings on diagnostic imaging of skull and head, not elsewhere classified: Secondary | ICD-10-CM | POA: Diagnosis not present

## 2022-12-06 DIAGNOSIS — Z95828 Presence of other vascular implants and grafts: Secondary | ICD-10-CM

## 2022-12-06 LAB — CMP (CANCER CENTER ONLY)
ALT: 8 U/L (ref 0–44)
AST: 11 U/L — ABNORMAL LOW (ref 15–41)
Albumin: 3.5 g/dL (ref 3.5–5.0)
Alkaline Phosphatase: 79 U/L (ref 38–126)
Anion gap: 5 (ref 5–15)
BUN: 15 mg/dL (ref 8–23)
CO2: 24 mmol/L (ref 22–32)
Calcium: 9.5 mg/dL (ref 8.9–10.3)
Chloride: 112 mmol/L — ABNORMAL HIGH (ref 98–111)
Creatinine: 0.76 mg/dL (ref 0.44–1.00)
GFR, Estimated: >60 mL/min (ref >60)
Glucose, Bld: 102 mg/dL — ABNORMAL HIGH (ref 70–99)
Potassium: 3.4 mmol/L — ABNORMAL LOW (ref 3.5–5.1)
Sodium: 141 mmol/L (ref 135–145)
Total Bilirubin: 0.2 mg/dL — ABNORMAL LOW (ref 0.3–1.2)
Total Protein: 8.6 g/dL — ABNORMAL HIGH (ref 6.5–8.1)

## 2022-12-06 LAB — CBC WITH DIFFERENTIAL (CANCER CENTER ONLY)
Abs Immature Granulocytes: 0.02 10*3/uL (ref 0.00–0.07)
Basophils Absolute: 0 10*3/uL (ref 0.0–0.1)
Basophils Relative: 1 %
Eosinophils Absolute: 0.1 10*3/uL (ref 0.0–0.5)
Eosinophils Relative: 1 %
HCT: 32 % — ABNORMAL LOW (ref 36.0–46.0)
Hemoglobin: 10.2 g/dL — ABNORMAL LOW (ref 12.0–15.0)
Immature Granulocytes: 0 %
Lymphocytes Relative: 17 %
Lymphs Abs: 1.2 10*3/uL (ref 0.7–4.0)
MCH: 33.4 pg (ref 26.0–34.0)
MCHC: 31.9 g/dL (ref 30.0–36.0)
MCV: 104.9 fL — ABNORMAL HIGH (ref 80.0–100.0)
Monocytes Absolute: 0.4 10*3/uL (ref 0.1–1.0)
Monocytes Relative: 7 %
Neutro Abs: 5.1 10*3/uL (ref 1.7–7.7)
Neutrophils Relative %: 74 %
Platelet Count: 271 10*3/uL (ref 150–400)
RBC: 3.05 MIL/uL — ABNORMAL LOW (ref 3.87–5.11)
RDW: 15 % (ref 11.5–15.5)
WBC Count: 6.8 10*3/uL (ref 4.0–10.5)
nRBC: 0 % (ref 0.0–0.2)

## 2022-12-06 LAB — SAMPLE TO BLOOD BANK

## 2022-12-06 MED ORDER — SODIUM CHLORIDE 0.9 % IV SOLN
1500.0000 mg | Freq: Once | INTRAVENOUS | Status: AC
  Administered 2022-12-06: 1500 mg via INTRAVENOUS
  Filled 2022-12-06: qty 30

## 2022-12-06 MED ORDER — HEPARIN SOD (PORK) LOCK FLUSH 100 UNIT/ML IV SOLN
500.0000 [IU] | Freq: Once | INTRAVENOUS | Status: AC | PRN
  Administered 2022-12-06: 500 [IU]

## 2022-12-06 MED ORDER — SODIUM CHLORIDE 0.9% FLUSH
10.0000 mL | INTRAVENOUS | Status: DC | PRN
  Administered 2022-12-06: 10 mL

## 2022-12-06 MED ORDER — SODIUM CHLORIDE 0.9% FLUSH
10.0000 mL | Freq: Once | INTRAVENOUS | Status: AC
  Administered 2022-12-06: 10 mL

## 2022-12-06 MED ORDER — SODIUM CHLORIDE 0.9 % IV SOLN: Freq: Once | INTRAVENOUS | Status: AC

## 2022-12-06 NOTE — Progress Notes (Signed)
[facility] Telephone:([phone]) [phone]   [phone]  OFFICE PROGRESS NOTE  [name], MD [address] [place] [address]  DIAGNOSIS:  1) Extensive stage small cell lung cancer (T3, N3, M1 C) . She presented with large right lower lobe lung masses in addition to bulky right hilar and mediastinal lymphadenopathy as well as suspicious lytic bone lesion at L1. She was diagnosed in March 2024.  2) HIV diagnosed in March 2024   PRIOR THERAPY: None   CURRENT THERAPY: Systemic chemotherapy with carboplatin for AUC of 4 on day 1 and etoposide 80 Mg/M2 on days 1, 2 and 3 with Imfinzi 1500 Mg on day 1 every 3 weeks with Neulasta support on day 5. First dose September 14, 2022 . She is on dose reduced chemotherapy due to her HIV. Status post 4 cycles.  Starting from cycle #5 her treatment with with maintenance immunotherapy with Imfinzi 1500 Mg IV every 4 weeks  INTERVAL HISTORY: [name] 67 y.o. female returns to the clinic today for follow-up visit.  The patient is feeling fine today with no concerning complaints.  She denied having any current chest pain, shortness of breath, cough or hemoptysis.  She has no nausea, vomiting, diarrhea or constipation.  She has no headache or visual changes.  She denied having any recent weight loss or night sweats.  She tolerated the last cycle of her treatment fairly well.  She is here today for evaluation before starting cycle #5 of her treatment.  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  MEDICATIONS:  Current Outpatient Medications  Medication Sig Dispense Refill   Aspirin 325 MG CAPS Take 325 mg by mouth daily.     baclofen (LIORESAL) 10 MG tablet Take 1 tablet (10 mg  total) by mouth 3 (three) times daily as needed for muscle spasms. 30 each 0   cholecalciferol (VITAMIN D3) 25 MCG (1000 UNIT) tablet Take 1,000 Units by mouth daily.     diphenoxylate-atropine (LOMOTIL) 2.5-0.025 MG tablet Take 1 tablet by mouth 4 (four) times daily as needed for diarrhea or loose stools. (Patient not taking: Reported on 11/15/2022) 30 tablet 0   dolutegravir-lamiVUDine (DOVATO) 50-300 MG tablet Take 1 tablet by mouth daily. 30 tablet 11   folic acid (FOLVITE) 1 MG tablet Take 1 tablet (1 mg total) by mouth daily. 90 tablet 0   guaifenesin (ROBITUSSIN) 100 MG/5ML syrup Take 200 mg by mouth 3 (three) times daily as needed for cough. (Patient not taking: Reported on 09/21/2022)     lidocaine-prilocaine (EMLA) cream Apply 1 Application topically as needed. 30 g 2   nicotine (NICODERM CQ) 7 mg/24hr patch Place 1 patch (7 mg total) onto the skin daily. 28 patch 0   nicotine polacrilex (NICORETTE) 4 MG gum Take 1 each (4 mg total) by mouth as needed for smoking cessation. 30 tablet 2   oxyCODONE-acetaminophen (PERCOCET/ROXICET) 5-325 MG tablet Take 1 tablet by mouth every 8 (eight) hours as needed for severe pain. (Patient not taking: Reported on 11/15/2022) 30 tablet 0   prochlorperazine (COMPAZINE) 10 MG tablet Take 1 tablet (10 mg total) by mouth every 6 (six) hours as needed. (Patient not  taking: Reported on 09/14/2022) 30 tablet 2   thiamine (VITAMIN B-1) 100 MG tablet Take 1 tablet (100 mg total) by mouth daily. 90 tablet 0   No current facility-administered medications for this visit.    SURGICAL HISTORY:  Past Surgical History:  Procedure Laterality Date   BRONCHIAL NEEDLE ASPIRATION BIOPSY  09/02/2022   Procedure: BRONCHIAL NEEDLE ASPIRATION BIOPSIES;  Surgeon: Josephine Igo, DO;  Location: [facility];  Service: Cardiopulmonary;;   IR IMAGING GUIDED PORT INSERTION  09/29/2022   MULTIPLE TOOTH EXTRACTIONS     ORIF ANKLE FRACTURE  05/03/2012   Procedure: OPEN REDUCTION INTERNAL  FIXATION (ORIF) ANKLE FRACTURE;  Surgeon: Toni Arthurs, MD;  Location: [facility] OR;  Service: Orthopedics;  Laterality: Left;   VIDEO BRONCHOSCOPY WITH ENDOBRONCHIAL ULTRASOUND Bilateral 09/02/2022   Procedure: VIDEO BRONCHOSCOPY WITH ENDOBRONCHIAL ULTRASOUND;  Surgeon: Josephine Igo, DO;  Location: [facility];  Service: Cardiopulmonary;  Laterality: Bilateral;    REVIEW OF SYSTEMS:  A comprehensive review of systems was negative.   PHYSICAL EXAMINATION: General appearance: alert, cooperative, and no distress Head: Normocephalic, without obvious abnormality, atraumatic Neck: no adenopathy, no JVD, supple, symmetrical, trachea midline, and thyroid not enlarged, symmetric, no tenderness/mass/nodules Lymph nodes: Cervical, supraclavicular, and axillary nodes normal. Resp: clear to auscultation bilaterally Back: symmetric, no curvature. ROM normal. No CVA tenderness. Cardio: regular rate and rhythm, S1, S2 normal, no murmur, click, rub or gallop GI: soft, non-tender; bowel sounds normal; no masses,  no organomegaly Extremities: extremities normal, atraumatic, no cyanosis or edema  ECOG PERFORMANCE STATUS: 1 - Symptomatic but completely ambulatory  Blood pressure 128/74, pulse (!) 109, temperature (!) 97.3 F (36.3 C), temperature source Temporal, resp. rate 16, height 5\' 9"  (1.753 m), weight 137 lb 3.2 oz (62.2 kg), SpO2 99 %.  LABORATORY DATA: Lab Results  Component Value Date   WBC 6.8 12/06/2022   HGB 10.2 (L) 12/06/2022   HCT 32.0 (L) 12/06/2022   MCV 104.9 (H) 12/06/2022   PLT 271 12/06/2022      Chemistry      Component Value Date/Time   NA 136 11/29/2022 0833   K 3.4 (L) 11/29/2022 0833   CL 107 11/29/2022 0833   CO2 25 11/29/2022 0833   BUN 7 (L) 11/29/2022 0833   CREATININE 0.53 11/29/2022 0833   CREATININE 0.53 09/21/2022 0942      Component Value Date/Time   CALCIUM 9.5 11/29/2022 0833   ALKPHOS 94 11/29/2022 0833   AST 11 (L) 11/29/2022 0833   ALT 8 11/29/2022 0833    BILITOT 0.3 11/29/2022 0833       RADIOGRAPHIC STUDIES: MR BRAIN W WO CONTRAST  Result Date: 11/22/2022 CLINICAL DATA:  Brain/CNS neoplasm, assess treatment response. Follow-up abnormal brain MRI. History of non-small cell lung cancer and HIV. EXAM: MRI HEAD WITHOUT AND WITH CONTRAST TECHNIQUE: Multiplanar, multiecho pulse sequences of the brain and surrounding structures were obtained without and with intravenous contrast. CONTRAST:  6mL GADAVIST GADOBUTROL 1 MMOL/ML IV SOLN COMPARISON:  Head MRI 09/15/2022 FINDINGS: Brain: The dominant abnormality on the prior MRI centered in the right frontal operculum demonstrates decreased T2 FLAIR hyperintensity, decreased gyral enhancement, and interval volume loss. Associated gyral intrinsic T1 hyperintensity and susceptibility are compatible with laminar necrosis and/or chronic blood products. A few small foci of cortical enhancement elsewhere in the right frontal and parietal lobes on the prior MRI have decreased, although there are also several new subcentimeter foci of cortical enhancement elsewhere in the right frontal and parietal lobes (annotated  with double arrows on series 16). Multiple foci of superficial chronic hemorrhage are again seen along right frontal and parietal cortex. There is no new restricted diffusion to indicate an acute infarct. Corticospinal tract wallerian degeneration is noted extending from the dominant right frontal abnormality into the brainstem. There is no intracranial mass effect or extra-axial fluid collection. Vascular: Major intracranial vascular flow voids are preserved. Skull and upper cervical spine: No suspicious marrow lesion. Sinuses/Orbits: Unremarkable orbits. Moderate mucosal thickening in the paranasal sinuses. Mucous retention cyst in the left maxillary sinus. No significant mastoid fluid. Other: None. IMPRESSION: 1. Improved appearance of the dominant right frontal abnormality most consistent with an evolving  infarct. 2. 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 Continued follow-up is recommended. Electronically Signed   By: Sebastian Ache M.D.   On: 11/22/2022 11:03   CT Chest W Contrast  Result Date: 11/16/2022 CLINICAL DATA:  Non-small-cell lung cancer. On treatment. Restaging. * Tracking Code: BO * EXAM: CT CHEST, ABDOMEN, AND PELVIS WITH CONTRAST TECHNIQUE: Multidetector CT imaging of the chest, abdomen and pelvis was performed following the standard protocol during bolus administration of intravenous contrast. RADIATION DOSE REDUCTION: This exam was performed according to the departmental dose-optimization program which includes automated exposure control, adjustment of the mA and/or kV according to patient size and/or use of iterative reconstruction technique. CONTRAST:  75mL OMNIPAQUE IOHEXOL 300 MG/ML  SOLN COMPARISON:  None Available. PET-CT 09/22/2022.  Standard chest CT without contrast 09/01/2022 FINDINGS: CT CHEST FINDINGS Cardiovascular: Left upper chest port which is accessed. Tip of the catheter along the right atrium. Heart is nonenlarged. Trace pericardial effusion. Coronary artery calcifications are seen. Please correlate for other coronary risk factors. The thoracic aorta has some mild atherosclerotic calcified plaque. 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 Mediastinum/Nodes: Normal caliber thoracic esophagus. Preserved thyroid gland. No specific abnormal lymph node enlargement identified in the axillary regions, left hilum. Right paratracheal lymph node mass which previously measured 3.6 x 2.0 cm, today on series 2, image 24 measures 2.1 by 1.0 cm. Subcarinal node also smaller, measuring today at 7 x 16 mm and previously would have measured in  retrospect 2.9 x 1.8 cm. Right hilar nodes also appears smaller. Difficult to measure on the prior examination without contrast. Residual today measures 14 by 10 mm on series 2, image 30. Clearly decreased in size. No new abnormal lymph node enlargement identified in the thorax. Lungs/Pleura: Emphysematous lung changes are seen including centrilobular and paraseptal. There is some linear opacity identified along bases likely scar or atelectasis. Slight breathing motion. No pneumothorax or effusion. The mass in the right lower lobe which previously measured 2.7 x 2.1 cm, today on series 4, image 86 measures 2.2 by 1.0 cm. Some adjacent nodularity along the course of the major fissure and middle lobe are decreasing as well. No new lung mass identified. Musculoskeletal: Scattered degenerative changes of the spine. CT ABDOMEN PELVIS FINDINGS Hepatobiliary: The liver has several tiny low-attenuation lesions, several which are too small to completely characterize but favor these being benign cystic foci. One of the larger lesions in the right hepatic lobe measures 14 by 10 mm on series 2, image 68 with Hounsfield unit 17. Benign cystic lesions. No specific imaging follow-up. These areas were not hypermetabolic  on the prior PET-CT. Dependent stone in the nondilated gallbladder. Patent portal vein. Pancreas: Unremarkable. No pancreatic ductal dilatation or surrounding inflammatory changes. Spleen: Normal in size without focal abnormality. Small splenule inferiorly. Adrenals/Urinary Tract: Adrenal glands are preserved. No enhancing renal mass or collecting system dilatation. Preserved contours of the urinary bladder. The bladder has slight wall thickening and is underdistended. Stomach/Bowel: No oral contrast. Moderate diffuse colonic stool. Large bowel is nondilated. Normal appendix seen in the right hemipelvis medial to the cecum. Mild fluid in the nondilated stomach. High density luminal debris in the stomach. Small bowel  is nondilated. Vascular/Lymphatic: Aortic atherosclerosis. No enlarged abdominal or pelvic lymph nodes. Reproductive: Uterus and bilateral adnexa are unremarkable. Other: No abdominal wall hernia or abnormality. No abdominopelvic ascites. Musculoskeletal: Scattered degenerative changes of the spine and pelvis. Osteopenia. IMPRESSION: Significant interval improvement of disease with decreasing mediastinal, right hilar lymph nodes as well as right-sided lung mass. Some residual. No new lesion identified at this time. No bowel obstruction, free air or free fluid. Moderate colonic stool. Coronary artery calcifications are seen. Please correlate for other coronary risk factors. Electronically Signed   By: Karen Kays M.D.   On: 11/16/2022 14:26   CT Abdomen Pelvis W Contrast  Result Date: 11/16/2022 CLINICAL DATA:  Non-small-cell lung cancer. On treatment. Restaging. * Tracking Code: BO * EXAM: CT CHEST, ABDOMEN, AND PELVIS WITH CONTRAST TECHNIQUE: Multidetector CT imaging of the chest, abdomen and pelvis was performed following the standard protocol during bolus administration of intravenous contrast. RADIATION DOSE REDUCTION: This exam was performed according to the departmental dose-optimization program which includes automated exposure control, adjustment of the mA and/or kV according to patient size and/or use of iterative reconstruction technique. CONTRAST:  75mL OMNIPAQUE IOHEXOL 300 MG/ML  SOLN COMPARISON:  None Available. PET-CT 09/22/2022.  Standard chest CT without contrast 09/01/2022 FINDINGS: CT CHEST FINDINGS Cardiovascular: Left upper chest port which is accessed. Tip of the catheter along the right atrium. Heart is nonenlarged. Trace pericardial effusion. Coronary artery calcifications are seen. Please correlate for other coronary risk factors. The thoracic aorta has some mild atherosclerotic calcified plaque. There is some plaque extending along the origin of the great vessels with a proximally 50%  stenosis of the brachiocephalic artery. Mediastinum/Nodes: Normal caliber thoracic esophagus. Preserved thyroid gland. No specific abnormal lymph node enlargement identified in the axillary regions, left hilum. Right paratracheal lymph node mass which previously measured 3.6 x 2.0 cm, today on series 2, image 24 measures 2.1 by 1.0 cm. Subcarinal node also smaller, measuring today at 7 x 16 mm and previously would have measured in retrospect 2.9 x 1.8 cm. Right hilar nodes also appears smaller. Difficult to measure on the prior examination without contrast. Residual today measures 14 by 10 mm on series 2, image 30. Clearly decreased in size. No new abnormal lymph node enlargement identified in the thorax. Lungs/Pleura: Emphysematous lung changes are seen including centrilobular and paraseptal. There is some linear opacity identified along bases likely scar or atelectasis. Slight breathing motion. No pneumothorax or effusion. The mass in the right lower lobe which previously measured 2.7 x 2.1 cm, today on series 4, image 86 measures 2.2 by 1.0 cm. Some adjacent nodularity along the course of the major fissure and middle lobe are decreasing as well. No new lung mass identified. Musculoskeletal: Scattered degenerative changes of the spine. CT ABDOMEN PELVIS FINDINGS Hepatobiliary: The liver has several tiny low-attenuation lesions, several which are too small to completely characterize but favor these being benign  cystic foci. One of the larger lesions in the right hepatic lobe measures 14 by 10 mm on series 2, image 68 with Hounsfield unit 17. Benign cystic lesions. No specific imaging follow-up. These areas were not hypermetabolic on the prior PET-CT. Dependent stone in the nondilated gallbladder. Patent portal vein. Pancreas: Unremarkable. No pancreatic ductal dilatation or surrounding inflammatory changes. Spleen: Normal in size without focal abnormality. Small splenule inferiorly. Adrenals/Urinary Tract: Adrenal  glands are preserved. No enhancing renal mass or collecting system dilatation. Preserved contours of the urinary bladder. The bladder has slight wall thickening and is underdistended. Stomach/Bowel: No oral contrast. Moderate diffuse colonic stool. Large bowel is nondilated. Normal appendix seen in the right hemipelvis medial to the cecum. Mild fluid in the nondilated stomach. High density luminal debris in the stomach. Small bowel is nondilated. Vascular/Lymphatic: Aortic atherosclerosis. No enlarged abdominal or pelvic lymph nodes. Reproductive: Uterus and bilateral adnexa are unremarkable. Other: No abdominal wall hernia or abnormality. No abdominopelvic ascites. Musculoskeletal: Scattered degenerative changes of the spine and pelvis. Osteopenia. IMPRESSION: Significant interval improvement of disease with decreasing mediastinal, right hilar lymph nodes as well as right-sided lung mass. Some residual. No new lesion identified at this time. No bowel obstruction, free air or free fluid. Moderate colonic stool. Coronary artery calcifications are seen. Please correlate for other coronary risk factors. Electronically Signed   By: Karen Kays M.D.   On: 11/16/2022 14:26    ASSESSMENT AND PLAN: This is a very pleasant 67 years old African-American female with Extensive stage small cell lung cancer (T3, N3, M1 C) . She presented with large right lower lobe lung masses in addition to bulky right hilar and mediastinal lymphadenopathy as well as suspicious lytic bone lesion at L1. She was diagnosed in March 2024.  The patient also has HIV diagnosed in March 2024 She is currently undergoing systemic chemotherapy with carboplatin for AUC of 4 on day 1 and etoposide 80 Mg/M2 on days 1, 2 and 3 with Imfinzi 1500 Mg on day 1 every 3 weeks with Neulasta support on day 5. First dose September 14, 2022 . She is on dose reduced chemotherapy due to her HIV. Status post 4 cycles.  Starting cycle #5 her treatment is with maintenance  immunotherapy with Imfinzi 1500 Mg IV every 4 weeks. The patient tolerated the last cycle of her treatment fairly well. I recommended for her to proceed with cycle #5 today as planned. I will see her back for follow-up visit in 4 weeks for evaluation before starting cycle #6. The patient was advised to call immediately if she has any other concerning symptoms in the interval. The patient voices understanding of current disease status and treatment options and is in agreement with the current care plan.  All questions were answered. The patient knows to call the clinic with any problems, questions or concerns. We can certainly see the patient much sooner if necessary.  The total time spent in the appointment was 20 minutes.  Disclaimer: This note was dictated with voice recognition software. Similar sounding words can inadvertently be transcribed and may not be corrected upon review.

## 2022-12-06 NOTE — Patient Instructions (Signed)
[facility]  Discharge Instructions: Thank you for choosing [facility] to provide your oncology and hematology care.   If you have a lab appointment with the Cancer Center, please go directly to the Cancer Center and check in at the registration area.   Wear comfortable clothing and clothing appropriate for easy access to any Portacath or PICC line.   We strive to give you quality time with your provider. You may need to reschedule your appointment if you arrive late (15 or more minutes).  Arriving late affects you and other patients whose appointments are after yours.  Also, if you miss three or more appointments without notifying the office, you may be dismissed from the clinic at the provider's discretion.      For prescription refill requests, have your pharmacy contact our office and allow 72 hours for refills to be completed.    Today you received the following chemotherapy and/or immunotherapy agents: Imfinzi      To help prevent nausea and vomiting after your treatment, we encourage you to take your nausea medication as directed.  BELOW ARE SYMPTOMS THAT SHOULD BE REPORTED IMMEDIATELY: *FEVER GREATER THAN 100.4 F (38 C) OR HIGHER *CHILLS OR SWEATING *NAUSEA AND VOMITING THAT IS NOT CONTROLLED WITH YOUR NAUSEA MEDICATION *UNUSUAL SHORTNESS OF BREATH *UNUSUAL BRUISING OR BLEEDING *URINARY PROBLEMS (pain or burning when urinating, or frequent urination) *BOWEL PROBLEMS (unusual diarrhea, constipation, pain near the anus) TENDERNESS IN MOUTH AND THROAT WITH OR WITHOUT PRESENCE OF ULCERS (sore throat, sores in mouth, or a toothache) UNUSUAL RASH, SWELLING OR PAIN  UNUSUAL VAGINAL DISCHARGE OR ITCHING   Items with * indicate a potential emergency and should be followed up as soon as possible or go to the Emergency Department if any problems should occur.  Please show the CHEMOTHERAPY ALERT CARD or IMMUNOTHERAPY ALERT CARD at  check-in to the Emergency Department and triage nurse.  Should you have questions after your visit or need to cancel or reschedule your appointment, please contact [facility]  Dept: [phone]  and follow the prompts.  Office hours are 8:00 a.m. to 4:30 p.m. Monday - Friday. Please note that voicemails left after 4:00 p.m. may not be returned until the following business day.  We are closed weekends and major holidays. You have access to a nurse at all times for urgent questions. Please call the main number to the clinic Dept: [phone] and follow the prompts.   For any non-urgent questions, you may also contact your provider using MyChart. We now offer e-Visits for anyone 18 and older to request care online for non-urgent symptoms. For details visit [email or web address].   Also download the MyChart app! Go to the app store, search "MyChart", open the app, select [facility], and log in with your MyChart username and password.  Durvalumab Injection What is this medication? DURVALUMAB (dur VAL ue mab) treats some types of cancer. It works by helping your immune system slow or stop the spread of cancer cells. It is a monoclonal antibody. This medicine may be used for other purposes; ask your health care provider or pharmacist if you have questions. COMMON BRAND NAME(S): IMFINZI What should I tell my care team before I take this medication? They need to know if you have any of these conditions: Allogeneic stem cell transplant (uses someone else's stem cells) Autoimmune diseases, such as Crohn disease, ulcerative colitis, lupus History of chest radiation Nervous system   problems, such as Guillain-Barre syndrome, myasthenia gravis Organ transplant An unusual or allergic reaction to durvalumab, other medications, foods, dyes, or preservatives Pregnant or trying to get pregnant Breast-feeding How should I use this medication? This medication is infused  into a vein. It is given by your care team in a hospital or clinic setting. A special MedGuide will be given to you before each treatment. Be sure to read this information carefully each time. Talk to your care team about the use of this medication in children. Special care may be needed. Overdosage: If you think you have taken too much of this medicine contact a poison control center or emergency room at once. NOTE: This medicine is only for you. Do not share this medicine with others. What if I miss a dose? Keep appointments for follow-up doses. It is important not to miss your dose. Call your care team if you are unable to keep an appointment. What may interact with this medication? Interactions have not been studied. This list may not describe all possible interactions. Give your health care provider a list of all the medicines, herbs, non-prescription drugs, or dietary supplements you use. Also tell them if you smoke, drink alcohol, or use illegal drugs. Some items may interact with your medicine. What should I watch for while using this medication? Your condition will be monitored carefully while you are receiving this medication. You may need blood work while taking this medication. This medication may cause serious skin reactions. They can happen weeks to months after starting the medication. Contact your care team right away if you notice fevers or flu-like symptoms with a rash. The rash may be red or purple and then turn into blisters or peeling of the skin. You may also notice a red rash with swelling of the face, lips, or lymph nodes in your neck or under your arms. Tell your care team right away if you have any change in your eyesight. Talk to your care team if you may be pregnant. Serious birth defects can occur if you take this medication during pregnancy and for 3 months after the last dose. You will need a negative pregnancy test before starting this medication. Contraception is  recommended while taking this medication and for 3 months after the last dose. Your care team can help you find the option that works for you. Do not breastfeed while taking this medication and for 3 months after the last dose. What side effects may I notice from receiving this medication? Side effects that you should report to your care team as soon as possible: Allergic reactions--skin rash, itching, hives, swelling of the face, lips, tongue, or throat Dry cough, shortness of breath or trouble breathing Eye pain, redness, irritation, or discharge with blurry or decreased vision Heart muscle inflammation--unusual weakness or fatigue, shortness of breath, chest pain, fast or irregular heartbeat, dizziness, swelling of the ankles, feet, or hands Hormone gland problems--headache, sensitivity to light, unusual weakness or fatigue, dizziness, fast or irregular heartbeat, increased sensitivity to cold or heat, excessive sweating, constipation, hair loss, increased thirst or amount of urine, tremors or shaking, irritability Infusion reactions--chest pain, shortness of breath or trouble breathing, feeling faint or lightheaded Kidney injury (glomerulonephritis)--decrease in the amount of urine, red or dark brown urine, foamy or bubbly urine, swelling of the ankles, hands, or feet Liver injury--right upper belly pain, loss of appetite, nausea, light-colored stool, dark yellow or brown urine, yellowing skin or eyes, unusual weakness or fatigue Pain, tingling, or numbness  in the hands or feet, muscle weakness, change in vision, confusion or trouble speaking, loss of balance or coordination, trouble walking, seizures Rash, fever, and swollen lymph nodes Redness, blistering, peeling, or loosening of the skin, including inside the mouth Sudden or severe stomach pain, bloody diarrhea, fever, nausea, vomiting Side effects that usually do not require medical attention (report these to your care team if they continue  or are bothersome): Bone, joint, or muscle pain Diarrhea Fatigue Loss of appetite Nausea Skin rash This list may not describe all possible side effects. Call your doctor for medical advice about side effects. You may report side effects to FDA at 1-800-FDA-1088. Where should I keep my medication? This medication is given in a hospital or clinic. It will not be stored at home. NOTE: This sheet is a summary. It may not cover all possible information. If you have questions about this medicine, talk to your doctor, pharmacist, or health care provider.  2024 Elsevier/Gold Standard (2021-10-12 00:00:00)

## 2022-12-07 ENCOUNTER — Other Ambulatory Visit: Payer: Self-pay

## 2022-12-08 ENCOUNTER — Telehealth: Payer: Self-pay | Admitting: Medical Oncology

## 2022-12-08 ENCOUNTER — Other Ambulatory Visit: Payer: Self-pay | Admitting: Internal Medicine

## 2022-12-08 ENCOUNTER — Other Ambulatory Visit: Payer: Self-pay

## 2022-12-08 ENCOUNTER — Other Ambulatory Visit (HOSPITAL_COMMUNITY): Payer: Self-pay

## 2022-12-08 MED ORDER — OXYCODONE-ACETAMINOPHEN 5-325 MG PO TABS
1.0000 | ORAL_TABLET | Freq: Three times a day (TID) | ORAL | 0 refills | Status: DC | PRN
  Filled 2022-12-08: qty 30, 10d supply, fill #0

## 2022-12-08 NOTE — Telephone Encounter (Signed)
Pain in both hips and lower back . She woke up this am and said the pain feels like a bad toothache. She request refill of her Oxycodone. She has two tablets left.

## 2022-12-09 ENCOUNTER — Other Ambulatory Visit: Payer: Self-pay

## 2022-12-09 ENCOUNTER — Other Ambulatory Visit (HOSPITAL_COMMUNITY): Payer: Self-pay

## 2022-12-09 ENCOUNTER — Telehealth: Payer: Self-pay | Admitting: Medical Oncology

## 2022-12-09 NOTE — Telephone Encounter (Signed)
New pain both hips, like a toothache. She woke up yesterday with this pain. She took her last 2 percocet yesterday. She took 5 tylenol this am and no relief.  She is requesting Oxycodone refill.

## 2022-12-09 NOTE — Telephone Encounter (Signed)
LVM that  refill was sent yesterday to [facility].

## 2022-12-12 ENCOUNTER — Other Ambulatory Visit: Payer: Self-pay

## 2022-12-12 ENCOUNTER — Encounter: Payer: Self-pay | Admitting: Internal Medicine

## 2022-12-12 ENCOUNTER — Encounter: Payer: Self-pay | Admitting: Physician Assistant

## 2022-12-12 ENCOUNTER — Other Ambulatory Visit (HOSPITAL_COMMUNITY): Payer: Self-pay

## 2022-12-13 ENCOUNTER — Telehealth: Payer: Self-pay | Admitting: Internal Medicine

## 2022-12-13 ENCOUNTER — Other Ambulatory Visit: Payer: Self-pay

## 2022-12-13 ENCOUNTER — Telehealth: Payer: Self-pay

## 2022-12-13 ENCOUNTER — Other Ambulatory Visit: Payer: 59

## 2022-12-13 ENCOUNTER — Inpatient Hospital Stay: Payer: Medicare HMO | Attending: Internal Medicine

## 2022-12-13 ENCOUNTER — Inpatient Hospital Stay: Payer: Medicare HMO

## 2022-12-13 VITALS — BP 116/60 | HR 95 | Temp 99.0°F | Resp 16

## 2022-12-13 DIAGNOSIS — C3490 Malignant neoplasm of unspecified part of unspecified bronchus or lung: Secondary | ICD-10-CM

## 2022-12-13 DIAGNOSIS — M899 Disorder of bone, unspecified: Secondary | ICD-10-CM | POA: Insufficient documentation

## 2022-12-13 DIAGNOSIS — C3431 Malignant neoplasm of lower lobe, right bronchus or lung: Secondary | ICD-10-CM | POA: Insufficient documentation

## 2022-12-13 DIAGNOSIS — Z5112 Encounter for antineoplastic immunotherapy: Secondary | ICD-10-CM | POA: Insufficient documentation

## 2022-12-13 DIAGNOSIS — G893 Neoplasm related pain (acute) (chronic): Secondary | ICD-10-CM | POA: Diagnosis not present

## 2022-12-13 DIAGNOSIS — Z79620 Long term (current) use of immunosuppressive biologic: Secondary | ICD-10-CM | POA: Insufficient documentation

## 2022-12-13 DIAGNOSIS — Z95828 Presence of other vascular implants and grafts: Secondary | ICD-10-CM

## 2022-12-13 LAB — CMP (CANCER CENTER ONLY)
ALT: 9 U/L (ref 0–44)
AST: 13 U/L — ABNORMAL LOW (ref 15–41)
Albumin: 3.6 g/dL (ref 3.5–5.0)
Alkaline Phosphatase: 72 U/L (ref 38–126)
Anion gap: 5 (ref 5–15)
BUN: 12 mg/dL (ref 8–23)
CO2: 25 mmol/L (ref 22–32)
Calcium: 9.2 mg/dL (ref 8.9–10.3)
Chloride: 110 mmol/L (ref 98–111)
Creatinine: 0.73 mg/dL (ref 0.44–1.00)
GFR, Estimated: >60 mL/min (ref >60)
Glucose, Bld: 105 mg/dL — ABNORMAL HIGH (ref 70–99)
Potassium: 3.8 mmol/L (ref 3.5–5.1)
Sodium: 140 mmol/L (ref 135–145)
Total Bilirubin: 0.2 mg/dL — ABNORMAL LOW (ref 0.3–1.2)
Total Protein: 8.1 g/dL (ref 6.5–8.1)

## 2022-12-13 LAB — CBC WITH DIFFERENTIAL (CANCER CENTER ONLY)
Abs Immature Granulocytes: 0.02 10*3/uL (ref 0.00–0.07)
Basophils Absolute: 0.1 10*3/uL (ref 0.0–0.1)
Basophils Relative: 1 %
Eosinophils Absolute: 0.1 10*3/uL (ref 0.0–0.5)
Eosinophils Relative: 3 %
HCT: 32.5 % — ABNORMAL LOW (ref 36.0–46.0)
Hemoglobin: 10.5 g/dL — ABNORMAL LOW (ref 12.0–15.0)
Immature Granulocytes: 0 %
Lymphocytes Relative: 20 %
Lymphs Abs: 1 10*3/uL (ref 0.7–4.0)
MCH: 33.3 pg (ref 26.0–34.0)
MCHC: 32.3 g/dL (ref 30.0–36.0)
MCV: 103.2 fL — ABNORMAL HIGH (ref 80.0–100.0)
Monocytes Absolute: 0.5 10*3/uL (ref 0.1–1.0)
Monocytes Relative: 9 %
Neutro Abs: 3.5 10*3/uL (ref 1.7–7.7)
Neutrophils Relative %: 67 %
Platelet Count: 407 10*3/uL — ABNORMAL HIGH (ref 150–400)
RBC: 3.15 MIL/uL — ABNORMAL LOW (ref 3.87–5.11)
RDW: 14 % (ref 11.5–15.5)
WBC Count: 5.2 10*3/uL (ref 4.0–10.5)
nRBC: 0 % (ref 0.0–0.2)

## 2022-12-13 MED ORDER — SODIUM CHLORIDE 0.9% FLUSH
10.0000 mL | Freq: Once | INTRAVENOUS | Status: AC
  Administered 2022-12-13: 10 mL

## 2022-12-13 MED ORDER — HEPARIN SOD (PORK) LOCK FLUSH 100 UNIT/ML IV SOLN
500.0000 [IU] | Freq: Once | INTRAVENOUS | Status: AC
  Administered 2022-12-13: 500 [IU]

## 2022-12-13 NOTE — Telephone Encounter (Signed)
Called patient regarding July/August appointments, left a voicemail. 

## 2022-12-13 NOTE — Telephone Encounter (Signed)
CRITICAL VALUE STICKER  CRITICAL VALUE: HGB 10.2  RECEIVER (on-site recipient of call): Sarahelizabeth Conway P. LPN   DATE & TIME NOTIFIED: 12/13/22 852 a  MESSENGER (representative from lab): Lauren    MD NOTIFIED: C. Heilingoetter, PA-C  TIME OF NOTIFICATION: 854 am  RESPONSE:  Will continue to monitor.  No transfusion needed at this time.

## 2022-12-14 ENCOUNTER — Other Ambulatory Visit (HOSPITAL_COMMUNITY): Payer: Self-pay

## 2022-12-14 DIAGNOSIS — R6889 Other general symptoms and signs: Secondary | ICD-10-CM | POA: Diagnosis not present

## 2022-12-20 ENCOUNTER — Other Ambulatory Visit: Payer: Self-pay

## 2022-12-20 ENCOUNTER — Other Ambulatory Visit (HOSPITAL_COMMUNITY): Payer: Self-pay

## 2022-12-20 ENCOUNTER — Inpatient Hospital Stay: Payer: Medicare HMO

## 2022-12-20 ENCOUNTER — Other Ambulatory Visit: Payer: Medicare HMO

## 2022-12-20 DIAGNOSIS — C3431 Malignant neoplasm of lower lobe, right bronchus or lung: Secondary | ICD-10-CM | POA: Diagnosis not present

## 2022-12-20 DIAGNOSIS — G893 Neoplasm related pain (acute) (chronic): Secondary | ICD-10-CM | POA: Diagnosis not present

## 2022-12-20 DIAGNOSIS — Z5112 Encounter for antineoplastic immunotherapy: Secondary | ICD-10-CM | POA: Diagnosis not present

## 2022-12-20 DIAGNOSIS — C3490 Malignant neoplasm of unspecified part of unspecified bronchus or lung: Secondary | ICD-10-CM

## 2022-12-20 DIAGNOSIS — Z79620 Long term (current) use of immunosuppressive biologic: Secondary | ICD-10-CM | POA: Diagnosis not present

## 2022-12-20 DIAGNOSIS — M899 Disorder of bone, unspecified: Secondary | ICD-10-CM | POA: Diagnosis not present

## 2022-12-20 DIAGNOSIS — Z95828 Presence of other vascular implants and grafts: Secondary | ICD-10-CM

## 2022-12-20 LAB — CBC WITH DIFFERENTIAL (CANCER CENTER ONLY)
Abs Immature Granulocytes: 0.02 10*3/uL (ref 0.00–0.07)
Basophils Absolute: 0 10*3/uL (ref 0.0–0.1)
Basophils Relative: 1 %
Eosinophils Absolute: 0.1 10*3/uL (ref 0.0–0.5)
Eosinophils Relative: 3 %
HCT: 34 % — ABNORMAL LOW (ref 36.0–46.0)
Hemoglobin: 11 g/dL — ABNORMAL LOW (ref 12.0–15.0)
Immature Granulocytes: 1 %
Lymphocytes Relative: 25 %
Lymphs Abs: 1 10*3/uL (ref 0.7–4.0)
MCH: 33.5 pg (ref 26.0–34.0)
MCHC: 32.4 g/dL (ref 30.0–36.0)
MCV: 103.7 fL — ABNORMAL HIGH (ref 80.0–100.0)
Monocytes Absolute: 0.4 10*3/uL (ref 0.1–1.0)
Monocytes Relative: 10 %
Neutro Abs: 2.5 10*3/uL (ref 1.7–7.7)
Neutrophils Relative %: 60 %
Platelet Count: 363 10*3/uL (ref 150–400)
RBC: 3.28 MIL/uL — ABNORMAL LOW (ref 3.87–5.11)
RDW: 13.2 % (ref 11.5–15.5)
WBC Count: 4.1 10*3/uL (ref 4.0–10.5)
nRBC: 0 % (ref 0.0–0.2)

## 2022-12-20 LAB — CMP (CANCER CENTER ONLY)
ALT: 8 U/L (ref 0–44)
AST: 14 U/L — ABNORMAL LOW (ref 15–41)
Albumin: 3.7 g/dL (ref 3.5–5.0)
Alkaline Phosphatase: 68 U/L (ref 38–126)
Anion gap: 5 (ref 5–15)
BUN: 15 mg/dL (ref 8–23)
CO2: 26 mmol/L (ref 22–32)
Calcium: 9.9 mg/dL (ref 8.9–10.3)
Chloride: 108 mmol/L (ref 98–111)
Creatinine: 0.82 mg/dL (ref 0.44–1.00)
GFR, Estimated: >60 mL/min (ref >60)
Glucose, Bld: 105 mg/dL — ABNORMAL HIGH (ref 70–99)
Potassium: 3.7 mmol/L (ref 3.5–5.1)
Sodium: 139 mmol/L (ref 135–145)
Total Bilirubin: 0.4 mg/dL (ref 0.3–1.2)
Total Protein: 9.1 g/dL — ABNORMAL HIGH (ref 6.5–8.1)

## 2022-12-20 LAB — SAMPLE TO BLOOD BANK

## 2022-12-20 MED ORDER — HEPARIN SOD (PORK) LOCK FLUSH 100 UNIT/ML IV SOLN
500.0000 [IU] | Freq: Once | INTRAVENOUS | Status: AC
  Administered 2022-12-20: 500 [IU]

## 2022-12-20 MED ORDER — SODIUM CHLORIDE 0.9% FLUSH
10.0000 mL | Freq: Once | INTRAVENOUS | Status: AC
  Administered 2022-12-20: 10 mL

## 2022-12-21 ENCOUNTER — Other Ambulatory Visit (HOSPITAL_COMMUNITY): Payer: Self-pay

## 2022-12-26 ENCOUNTER — Encounter: Payer: Self-pay | Admitting: Internal Medicine

## 2022-12-26 NOTE — Progress Notes (Signed)
Patient called to inquire about a grant expense. Advised details on that expense. Asked if she still has the green folder with the expense sheet for [place] to go over. She states she does and will call me back to discuss.  She has my card to do so and for any additional financial questions or concerns.

## 2022-12-27 ENCOUNTER — Inpatient Hospital Stay: Payer: Medicare HMO

## 2022-12-27 ENCOUNTER — Other Ambulatory Visit: Payer: Self-pay

## 2022-12-27 ENCOUNTER — Other Ambulatory Visit: Payer: Medicare HMO

## 2022-12-27 DIAGNOSIS — M899 Disorder of bone, unspecified: Secondary | ICD-10-CM | POA: Diagnosis not present

## 2022-12-27 DIAGNOSIS — C3431 Malignant neoplasm of lower lobe, right bronchus or lung: Secondary | ICD-10-CM | POA: Diagnosis not present

## 2022-12-27 DIAGNOSIS — G893 Neoplasm related pain (acute) (chronic): Secondary | ICD-10-CM | POA: Diagnosis not present

## 2022-12-27 DIAGNOSIS — Z95828 Presence of other vascular implants and grafts: Secondary | ICD-10-CM

## 2022-12-27 DIAGNOSIS — C3490 Malignant neoplasm of unspecified part of unspecified bronchus or lung: Secondary | ICD-10-CM

## 2022-12-27 DIAGNOSIS — Z79620 Long term (current) use of immunosuppressive biologic: Secondary | ICD-10-CM | POA: Diagnosis not present

## 2022-12-27 DIAGNOSIS — Z5112 Encounter for antineoplastic immunotherapy: Secondary | ICD-10-CM | POA: Diagnosis not present

## 2022-12-27 LAB — CBC WITH DIFFERENTIAL (CANCER CENTER ONLY)
Abs Immature Granulocytes: 0.01 10*3/uL (ref 0.00–0.07)
Basophils Absolute: 0 10*3/uL (ref 0.0–0.1)
Basophils Relative: 1 %
Eosinophils Absolute: 0.2 10*3/uL (ref 0.0–0.5)
Eosinophils Relative: 3 %
HCT: 33.2 % — ABNORMAL LOW (ref 36.0–46.0)
Hemoglobin: 10.6 g/dL — ABNORMAL LOW (ref 12.0–15.0)
Immature Granulocytes: 0 %
Lymphocytes Relative: 23 %
Lymphs Abs: 1.1 10*3/uL (ref 0.7–4.0)
MCH: 32.9 pg (ref 26.0–34.0)
MCHC: 31.9 g/dL (ref 30.0–36.0)
MCV: 103.1 fL — ABNORMAL HIGH (ref 80.0–100.0)
Monocytes Absolute: 0.4 10*3/uL (ref 0.1–1.0)
Monocytes Relative: 9 %
Neutro Abs: 2.9 10*3/uL (ref 1.7–7.7)
Neutrophils Relative %: 64 %
Platelet Count: 250 10*3/uL (ref 150–400)
RBC: 3.22 MIL/uL — ABNORMAL LOW (ref 3.87–5.11)
RDW: 12.7 % (ref 11.5–15.5)
WBC Count: 4.6 10*3/uL (ref 4.0–10.5)
nRBC: 0 % (ref 0.0–0.2)

## 2022-12-27 LAB — CMP (CANCER CENTER ONLY)
ALT: 8 U/L (ref 0–44)
AST: 13 U/L — ABNORMAL LOW (ref 15–41)
Albumin: 3.8 g/dL (ref 3.5–5.0)
Alkaline Phosphatase: 58 U/L (ref 38–126)
Anion gap: 5 (ref 5–15)
BUN: 13 mg/dL (ref 8–23)
CO2: 23 mmol/L (ref 22–32)
Calcium: 9.5 mg/dL (ref 8.9–10.3)
Chloride: 110 mmol/L (ref 98–111)
Creatinine: 0.7 mg/dL (ref 0.44–1.00)
GFR, Estimated: >60 mL/min (ref >60)
Glucose, Bld: 84 mg/dL (ref 70–99)
Potassium: 3.4 mmol/L — ABNORMAL LOW (ref 3.5–5.1)
Sodium: 138 mmol/L (ref 135–145)
Total Bilirubin: 0.2 mg/dL — ABNORMAL LOW (ref 0.3–1.2)
Total Protein: 8.6 g/dL — ABNORMAL HIGH (ref 6.5–8.1)

## 2022-12-27 MED ORDER — HEPARIN SOD (PORK) LOCK FLUSH 100 UNIT/ML IV SOLN
500.0000 [IU] | Freq: Once | INTRAVENOUS | Status: AC
  Administered 2022-12-27: 500 [IU]

## 2022-12-27 MED ORDER — SODIUM CHLORIDE 0.9% FLUSH
10.0000 mL | Freq: Once | INTRAVENOUS | Status: AC
  Administered 2022-12-27: 10 mL

## 2022-12-27 NOTE — Progress Notes (Signed)
Pt. Came in for port flush and labs!  C/O the inside of her lips feel raw and swollen since going to the Dentist 2 weeks ago.  Called the Dentist office and was told the Dentist want be back until 12/30/22.  Secure chatted Dr. Arbutus Ped and nurse Tammi/RN.  Tammi states she will call the patient niece Lynden Ang due to patient phone not working.

## 2022-12-31 NOTE — Progress Notes (Unsigned)
[facility] OFFICE PROGRESS NOTE  [name], DO [address] [place] [address]  DIAGNOSIS: 1) Extensive stage small cell lung cancer (T3, N3, M1 C) . She presented with large right lower lobe lung masses in addition to bulky right hilar and mediastinal lymphadenopathy as well as suspicious lytic bone lesion at L1. She was diagnosed in March 2024.  2) HIV diagnosed in March 2024 follows with Dr. Luciana Axe from infectious disease  PRIOR THERAPY: None  CURRENT THERAPY: Systemic chemotherapy with carboplatin for AUC of 4 on day 1 and etoposide 80 Mg/M2 on days 1, 2 and 3 with Imfinzi 1500 Mg on day 1 every 3 weeks with Neulasta support on day 5. First dose September 14, 2022 . She is on dose reduced chemotherapy due to her HIV. Status post 5 cycle. Starting from cycle #5, she started single agent maintenance immunotherapy with Imfinzi 1500 mg IV every 4 weeks.   INTERVAL HISTORY: [name] 67 y.o. female returns to the clinic today for a follow-up visit.  The patient is being followed for her extensive stage small cell lung cancer.  She is currently on single agent maintenance immunotherapy with Imfinzi and she tolerates it well without any concerning adverse side effects.   Today she denies any fever, chills, night sweats, or unexplained weight loss. She denies any significant shortness of breath unless she overexerts. Denies any cough, chest pain, or hemoptysis.  Denies any nausea, vomiting, diarrhea, or constipation. She mentions she needs to get a new glasses for reading. She states she has been reading a lot and sometimes gets headaches since she needs new glasses. She follows closely with Dr. Barbaraann Cao and has routine MRIs the most recent being on 11/11/22.  Her next restaging CT is expected in December 2024. She had a rash and itching on her left upper arm recently which has since resolved. She is here today for evaluation repeat blood work before undergoing cycle  #6.    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  MEDICATIONS:  Current Outpatient Medications  Medication Sig Dispense Refill   Aspirin 325 MG CAPS Take 325 mg by mouth daily.     baclofen (LIORESAL) 10 MG tablet Take 1 tablet (10 mg total) by mouth 3 (three) times daily as needed for muscle spasms. 30 each 0   cholecalciferol (VITAMIN D3) 25 MCG (1000 UNIT) tablet Take 1,000 Units by mouth daily.     diphenoxylate-atropine (LOMOTIL) 2.5-0.025 MG tablet Take 1 tablet by mouth 4 (four) times daily as needed for diarrhea or loose stools. (Patient not taking: Reported on 11/15/2022) 30 tablet 0   dolutegravir-lamiVUDine (DOVATO) 50-300 MG tablet Take 1 tablet by mouth daily. 30 tablet 11   folic acid (FOLVITE) 1 MG tablet Take 1 tablet (1 mg total) by mouth daily. 90 tablet 0   guaifenesin (ROBITUSSIN) 100 MG/5ML syrup Take 200 mg by mouth 3 (three) times daily as needed for cough. (Patient not taking: Reported on 09/21/2022)     lidocaine-prilocaine (EMLA) cream Apply 1 Application topically as needed. 30 g 2   nicotine (NICODERM CQ) 7 mg/24hr patch Place 1 patch (7 mg total) onto the skin daily. 28 patch 0  nicotine polacrilex (NICORETTE) 4 MG gum Take 1 each (4 mg total) by mouth as needed for smoking cessation. 30 tablet 2   oxyCODONE-acetaminophen (PERCOCET/ROXICET) 5-325 MG tablet Take 1 tablet by mouth every 8 (eight) hours as needed for severe pain. 30 tablet 0   prochlorperazine (COMPAZINE) 10 MG tablet Take 1 tablet (10 mg total) by mouth every 6 (six) hours as needed. (Patient not taking: Reported on 09/14/2022) 30 tablet 2   thiamine (VITAMIN B-1) 100 MG tablet Take 1 tablet (100 mg total) by mouth daily. 90 tablet 0   No current  facility-administered medications for this visit.    SURGICAL HISTORY:  Past Surgical History:  Procedure Laterality Date   BRONCHIAL NEEDLE ASPIRATION BIOPSY  09/02/2022   Procedure: BRONCHIAL NEEDLE ASPIRATION BIOPSIES;  Surgeon: Josephine Igo, DO;  Location: [facility];  Service: Cardiopulmonary;;   IR IMAGING GUIDED PORT INSERTION  09/29/2022   MULTIPLE TOOTH EXTRACTIONS     ORIF ANKLE FRACTURE  05/03/2012   Procedure: OPEN REDUCTION INTERNAL FIXATION (ORIF) ANKLE FRACTURE;  Surgeon: Toni Arthurs, MD;  Location: [facility] OR;  Service: Orthopedics;  Laterality: Left;   VIDEO BRONCHOSCOPY WITH ENDOBRONCHIAL ULTRASOUND Bilateral 09/02/2022   Procedure: VIDEO BRONCHOSCOPY WITH ENDOBRONCHIAL ULTRASOUND;  Surgeon: Josephine Igo, DO;  Location: [facility] ENDOS[facility];  Service: Cardiopulmonary;  Laterality: Bilateral;    REVIEW OF SYSTEMS:   Review of Systems  Constitutional: Negative for appetite change, chills, fatigue, fever and unexpected weight change.  HENT: Negative for mouth sores, nosebleeds, sore throat and trouble swallowing.   Eyes: Negative for eye problems and icterus.  Respiratory: Negative for cough, hemoptysis, shortness of breath and wheezing.   Cardiovascular: Negative for chest pain and leg swelling.  Gastrointestinal: Negative for abdominal pain, constipation, diarrhea, nausea and vomiting.  Genitourinary: Negative for bladder incontinence, difficulty urinating, dysuria, frequency and hematuria.   Musculoskeletal: Negative for back pain, gait problem, neck pain and neck stiffness.  Skin: positive for rash and itching which resolved on left upper arm.  Neurological: Positive for occasional headache. Negative for dizziness, extremity weakness, gait problem, light-headedness and seizures.  Hematological: Negative for adenopathy. Does not bruise/bleed easily.  Psychiatric/Behavioral: Negative for confusion, depression and sleep disturbance. The patient is not nervous/anxious.      PHYSICAL EXAMINATION:  Blood pressure 117/73, pulse (!) 101, temperature 97.9 F (36.6 C), resp. rate 17, weight 139 lb 12.8 oz (63.4 kg), SpO2 100%.  ECOG PERFORMANCE STATUS: 1  Physical Exam  Constitutional: Oriented to person, place, and time and thin appearing female and in no distress.  HENT:  Head: Normocephalic and atraumatic.  Mouth/Throat: Oropharynx is clear and moist. No oropharyngeal exudate.  Eyes: Conjunctivae are normal. Right eye exhibits no discharge. Left eye exhibits no discharge. No scleral icterus.  Neck: Normal range of motion. Neck supple.  Cardiovascular: Normal rate, regular rhythm, normal heart sounds and intact distal pulses.   Pulmonary/Chest: Effort normal and breath sounds normal. No respiratory distress. No wheezes. No rales.  Abdominal: Soft. Bowel sounds are normal. Exhibits no distension and no mass. There is no tenderness.  Musculoskeletal: Normal range of motion. Exhibits no edema.  Lymphadenopathy:    No cervical adenopathy.  Neurological: Alert and oriented to person, place, and time. Exhibits muscle wasting. Gait normal. Coordination normal.  Skin: Skin is warm and dry. No rash noted. Not diaphoretic. No erythema. No pallor.  Psychiatric: Mood, memory and judgment normal.  Vitals reviewed.  LABORATORY DATA: Lab Results  Component Value Date   WBC 4.6  12/27/2022   HGB 10.6 (L) 12/27/2022   HCT 33.2 (L) 12/27/2022   MCV 103.1 (H) 12/27/2022   PLT 250 12/27/2022      Chemistry      Component Value Date/Time   NA 138 12/27/2022 0836   K 3.4 (L) 12/27/2022 0836   CL 110 12/27/2022 0836   CO2 23 12/27/2022 0836   BUN 13 12/27/2022 0836   CREATININE 0.70 12/27/2022 0836   CREATININE 0.53 09/21/2022 0942      Component Value Date/Time   CALCIUM 9.5 12/27/2022 0836   ALKPHOS 58 12/27/2022 0836   AST 13 (L) 12/27/2022 0836   ALT 8 12/27/2022 0836   BILITOT 0.2 (L) 12/27/2022 0836       RADIOGRAPHIC STUDIES:  No results  found.   ASSESSMENT/PLAN:  This is a very pleasant 67 year old female with extensive stage small cell lung cancer (T3, N3, M1 C) . She presented with large right lower lobe lung masses in addition to bulky right hilar and mediastinal lymphadenopathy as well as suspicious lytic bone lesion at L1. She was diagnosed in March 2024.    She is currently on systemic chemotherapy with carboplatin for AUC of 4 on day 1 and etoposide 80 Mg/M2 on days 1, 2 and 3 with Neulasta support on day 5 as well as Imfinzi 1500 Mg IV on day 1 every 3 weeks. She had reduced dose of carboplatin and etoposide side because of her HIV status.  Starting with cycle #5, she started single agent maintenance immunotherapy with Imfinzi 1500 mg IV every 4 weeks she is status post 5 cycles and tolerated it well.    Labs were reviewed. Her CMP is pending. As long as this is within parameters, recommend proceed she with cycle #6 today as scheduled.    We will arrange for a restaging CT scan prior to her next appointment.    We will see her back for a follow up visit in 4 weeks for evalation and repeat blood work before starting cycle #7   She will continue to use the percocet for her cancer related pain.   It is possible her rash on her left arm was secondary to her immunotherapy. It has resolved at this time. Should this occur again, advised to use anti-histamines and topical hydrocortisone cream. If she needs something stronger for the itch, advised to call me back and I would consider kenalog cream.    The patient was advised to call immediately if she has any concerning symptoms in the interval. The patient voices understanding of current disease status and treatment options and is in agreement with the current care plan. All questions were answered. The patient knows to call the clinic with any problems, questions or concerns. We can certainly see the patient much sooner if necessary    Orders Placed This Encounter   Procedures   CT CHEST ABDOMEN PELVIS W CONTRAST    Standing Status:   Future    Standing Expiration Date:   01/03/2024    Scheduling Instructions:     Please call her niece Lynden Ang to schedule. Patient's phone not working. If she does not hear from you, she knows to call soon.    Order Specific Question:   If indicated for the ordered procedure, I authorize the administration of contrast media per Radiology protocol    Answer:   Yes    Order Specific Question:   Does the patient have a contrast media/X-ray dye allergy?    Answer:  No    Order Specific Question:   Preferred imaging location?    Answer:   [facility]    Order Specific Question:   If indicated for the ordered procedure, I authorize the administration of oral contrast media per Radiology protocol    Answer:   Yes     The total time spent in the appointment was 20-29 minutes   Shameria Trimarco L Pierina Schuknecht, PA-C 01/03/23

## 2023-01-03 ENCOUNTER — Inpatient Hospital Stay (HOSPITAL_BASED_OUTPATIENT_CLINIC_OR_DEPARTMENT_OTHER): Payer: Medicare HMO | Admitting: Physician Assistant

## 2023-01-03 ENCOUNTER — Other Ambulatory Visit: Payer: Self-pay

## 2023-01-03 ENCOUNTER — Other Ambulatory Visit: Payer: Medicare HMO

## 2023-01-03 ENCOUNTER — Inpatient Hospital Stay: Payer: Medicare HMO

## 2023-01-03 VITALS — BP 117/73 | HR 101 | Temp 97.9°F | Resp 17 | Wt 139.8 lb

## 2023-01-03 VITALS — HR 98

## 2023-01-03 DIAGNOSIS — M899 Disorder of bone, unspecified: Secondary | ICD-10-CM | POA: Diagnosis not present

## 2023-01-03 DIAGNOSIS — Z95828 Presence of other vascular implants and grafts: Secondary | ICD-10-CM

## 2023-01-03 DIAGNOSIS — Z5112 Encounter for antineoplastic immunotherapy: Secondary | ICD-10-CM | POA: Diagnosis not present

## 2023-01-03 DIAGNOSIS — C3490 Malignant neoplasm of unspecified part of unspecified bronchus or lung: Secondary | ICD-10-CM | POA: Diagnosis not present

## 2023-01-03 DIAGNOSIS — G893 Neoplasm related pain (acute) (chronic): Secondary | ICD-10-CM | POA: Diagnosis not present

## 2023-01-03 DIAGNOSIS — C3431 Malignant neoplasm of lower lobe, right bronchus or lung: Secondary | ICD-10-CM | POA: Diagnosis not present

## 2023-01-03 DIAGNOSIS — Z79620 Long term (current) use of immunosuppressive biologic: Secondary | ICD-10-CM | POA: Diagnosis not present

## 2023-01-03 LAB — CMP (CANCER CENTER ONLY)
ALT: 7 U/L (ref 0–44)
AST: 12 U/L — ABNORMAL LOW (ref 15–41)
Albumin: 3.7 g/dL (ref 3.5–5.0)
Alkaline Phosphatase: 49 U/L (ref 38–126)
Anion gap: 5 (ref 5–15)
BUN: 12 mg/dL (ref 8–23)
CO2: 24 mmol/L (ref 22–32)
Calcium: 9.6 mg/dL (ref 8.9–10.3)
Chloride: 107 mmol/L (ref 98–111)
Creatinine: 0.66 mg/dL (ref 0.44–1.00)
GFR, Estimated: >60 mL/min (ref >60)
Glucose, Bld: 95 mg/dL (ref 70–99)
Potassium: 3.4 mmol/L — ABNORMAL LOW (ref 3.5–5.1)
Sodium: 136 mmol/L (ref 135–145)
Total Bilirubin: 0.3 mg/dL (ref 0.3–1.2)
Total Protein: 8.1 g/dL (ref 6.5–8.1)

## 2023-01-03 LAB — CBC WITH DIFFERENTIAL (CANCER CENTER ONLY)
Abs Immature Granulocytes: 0.01 10*3/uL (ref 0.00–0.07)
Basophils Absolute: 0 10*3/uL (ref 0.0–0.1)
Basophils Relative: 1 %
Eosinophils Absolute: 0.1 10*3/uL (ref 0.0–0.5)
Eosinophils Relative: 3 %
HCT: 31.4 % — ABNORMAL LOW (ref 36.0–46.0)
Hemoglobin: 10.2 g/dL — ABNORMAL LOW (ref 12.0–15.0)
Immature Granulocytes: 0 %
Lymphocytes Relative: 27 %
Lymphs Abs: 1.3 10*3/uL (ref 0.7–4.0)
MCH: 33.1 pg (ref 26.0–34.0)
MCHC: 32.5 g/dL (ref 30.0–36.0)
MCV: 101.9 fL — ABNORMAL HIGH (ref 80.0–100.0)
Monocytes Absolute: 0.3 10*3/uL (ref 0.1–1.0)
Monocytes Relative: 6 %
Neutro Abs: 3.2 10*3/uL (ref 1.7–7.7)
Neutrophils Relative %: 63 %
Platelet Count: 239 10*3/uL (ref 150–400)
RBC: 3.08 MIL/uL — ABNORMAL LOW (ref 3.87–5.11)
RDW: 12.5 % (ref 11.5–15.5)
WBC Count: 5 10*3/uL (ref 4.0–10.5)
nRBC: 0 % (ref 0.0–0.2)

## 2023-01-03 LAB — TSH: TSH: 0.736 u[IU]/mL (ref 0.350–4.500)

## 2023-01-03 MED ORDER — SODIUM CHLORIDE 0.9 % IV SOLN
1500.0000 mg | Freq: Once | INTRAVENOUS | Status: AC
  Administered 2023-01-03: 1500 mg via INTRAVENOUS
  Filled 2023-01-03: qty 30

## 2023-01-03 MED ORDER — SODIUM CHLORIDE 0.9 % IV SOLN: Freq: Once | INTRAVENOUS | Status: AC

## 2023-01-03 MED ORDER — SODIUM CHLORIDE 0.9% FLUSH
10.0000 mL | Freq: Once | INTRAVENOUS | Status: AC
  Administered 2023-01-03: 10 mL

## 2023-01-04 LAB — T4: T4, Total: 4.9 ug/dL (ref 4.5–12.0)

## 2023-01-10 ENCOUNTER — Other Ambulatory Visit: Payer: Self-pay

## 2023-01-10 ENCOUNTER — Other Ambulatory Visit: Payer: Medicare HMO

## 2023-01-10 ENCOUNTER — Inpatient Hospital Stay: Payer: Medicare HMO

## 2023-01-10 DIAGNOSIS — Z5112 Encounter for antineoplastic immunotherapy: Secondary | ICD-10-CM | POA: Diagnosis not present

## 2023-01-10 DIAGNOSIS — C3490 Malignant neoplasm of unspecified part of unspecified bronchus or lung: Secondary | ICD-10-CM

## 2023-01-10 DIAGNOSIS — Z95828 Presence of other vascular implants and grafts: Secondary | ICD-10-CM

## 2023-01-10 DIAGNOSIS — G893 Neoplasm related pain (acute) (chronic): Secondary | ICD-10-CM | POA: Diagnosis not present

## 2023-01-10 DIAGNOSIS — Z79620 Long term (current) use of immunosuppressive biologic: Secondary | ICD-10-CM | POA: Diagnosis not present

## 2023-01-10 DIAGNOSIS — C3431 Malignant neoplasm of lower lobe, right bronchus or lung: Secondary | ICD-10-CM | POA: Diagnosis not present

## 2023-01-10 DIAGNOSIS — M899 Disorder of bone, unspecified: Secondary | ICD-10-CM | POA: Diagnosis not present

## 2023-01-10 LAB — CBC WITH DIFFERENTIAL (CANCER CENTER ONLY)
Abs Immature Granulocytes: 0.01 10*3/uL (ref 0.00–0.07)
Basophils Absolute: 0 10*3/uL (ref 0.0–0.1)
Basophils Relative: 1 %
Eosinophils Absolute: 0.1 10*3/uL (ref 0.0–0.5)
Eosinophils Relative: 2 %
HCT: 30.8 % — ABNORMAL LOW (ref 36.0–46.0)
Hemoglobin: 10.1 g/dL — ABNORMAL LOW (ref 12.0–15.0)
Immature Granulocytes: 0 %
Lymphocytes Relative: 28 %
Lymphs Abs: 1.2 10*3/uL (ref 0.7–4.0)
MCH: 32.9 pg (ref 26.0–34.0)
MCHC: 32.8 g/dL (ref 30.0–36.0)
MCV: 100.3 fL — ABNORMAL HIGH (ref 80.0–100.0)
Monocytes Absolute: 0.4 10*3/uL (ref 0.1–1.0)
Monocytes Relative: 8 %
Neutro Abs: 2.7 10*3/uL (ref 1.7–7.7)
Neutrophils Relative %: 61 %
Platelet Count: 223 10*3/uL (ref 150–400)
RBC: 3.07 MIL/uL — ABNORMAL LOW (ref 3.87–5.11)
RDW: 11.9 % (ref 11.5–15.5)
WBC Count: 4.4 10*3/uL (ref 4.0–10.5)
nRBC: 0 % (ref 0.0–0.2)

## 2023-01-10 LAB — CMP (CANCER CENTER ONLY)
ALT: 7 U/L (ref 0–44)
AST: 11 U/L — ABNORMAL LOW (ref 15–41)
Albumin: 3.6 g/dL (ref 3.5–5.0)
Alkaline Phosphatase: 53 U/L (ref 38–126)
Anion gap: 3 — ABNORMAL LOW (ref 5–15)
BUN: 9 mg/dL (ref 8–23)
CO2: 24 mmol/L (ref 22–32)
Calcium: 9.2 mg/dL (ref 8.9–10.3)
Chloride: 111 mmol/L (ref 98–111)
Creatinine: 0.63 mg/dL (ref 0.44–1.00)
GFR, Estimated: >60 mL/min (ref >60)
Glucose, Bld: 94 mg/dL (ref 70–99)
Potassium: 3.6 mmol/L (ref 3.5–5.1)
Sodium: 138 mmol/L (ref 135–145)
Total Bilirubin: 0.4 mg/dL (ref 0.3–1.2)
Total Protein: 8.1 g/dL (ref 6.5–8.1)

## 2023-01-10 MED ORDER — SODIUM CHLORIDE 0.9% FLUSH
10.0000 mL | Freq: Once | INTRAVENOUS | Status: AC
  Administered 2023-01-10: 10 mL

## 2023-01-10 MED ORDER — HEPARIN SOD (PORK) LOCK FLUSH 100 UNIT/ML IV SOLN
500.0000 [IU] | Freq: Once | INTRAVENOUS | Status: AC
  Administered 2023-01-10: 500 [IU]

## 2023-01-12 ENCOUNTER — Telehealth: Payer: Self-pay

## 2023-01-12 NOTE — Telephone Encounter (Signed)
Patient called stating that she was having increasing pain and had been coughing up blood on Tuesday. Patient is requesting pain medication at this time. Left number for niece, Lynden Ang, for call back as patient does not have a phone. Dr. Arbutus Ped aware and advises for patient to take tylenol and if needed, ibuprofen sparingly. Dr. Arbutus Ped would also like patient to come in to the clinic for further evaluation.  RN spoke with patient's niece, Lynden Ang, who stated that she was not with patient at this time, but could relay message. RN provided her with Dr. Asa Lente recommendations and requested that patient call back to the clinic to see about getting her scheduled for further evaluation.  Niece verbalized an understanding of the information and confirmed that she would get in contact with the patient.  Message relayed to [facility] and [facility].

## 2023-01-13 ENCOUNTER — Telehealth: Payer: Self-pay

## 2023-01-13 ENCOUNTER — Other Ambulatory Visit: Payer: Self-pay

## 2023-01-13 NOTE — Telephone Encounter (Signed)
This nurse reached out to niece.  Stated that this patient sent a message on yesterday stating that she has been coughing up blood and that she needs an appointment.  She was supposed to call the clinic due to her phone being off, patient has not called.  The niece stated that she was currently at work but when she gets off she will go by and check on the patient.  Advised that the provider would like the patient to go to the ED to be evaluated if she is still coughing up blood and/or is having shortness of breath.  The niece acknowledged understanding and has no other questions or concerns at this time.

## 2023-01-13 NOTE — Progress Notes (Signed)
Late Entry for Tuesday 01/10/2023:  This nurse was notified by the flush nurse that patient stated that she coughed up a blood clot that morning.  This nurse went and spoke with the patient who stated it started with a tickle in her throat and she begin to cough and produced one clot the "about the size of a fifty cent piece".  Patient states then she was fine.  No more clots and no more coughing.  Patient denies pain, nausea or vomiting.  This nurse made provider aware who advised for patient to monitor for more blood clots and to notify the office if it occurs again or she develops other symptoms.  Patient acknowledged understanding and was stable and ambulatory at discharge from the clinic.

## 2023-01-16 ENCOUNTER — Other Ambulatory Visit (HOSPITAL_COMMUNITY): Payer: Self-pay

## 2023-01-18 ENCOUNTER — Encounter (HOSPITAL_COMMUNITY): Payer: Self-pay

## 2023-01-18 ENCOUNTER — Other Ambulatory Visit (HOSPITAL_COMMUNITY): Payer: Self-pay

## 2023-01-19 ENCOUNTER — Inpatient Hospital Stay: Payer: Medicare HMO | Attending: Internal Medicine

## 2023-01-19 ENCOUNTER — Ambulatory Visit (HOSPITAL_COMMUNITY)
Admission: RE | Admit: 2023-01-19 | Discharge: 2023-01-19 | Disposition: A | Discharge status: Home / Self Care | Payer: Medicare HMO | Source: Ambulatory Visit | Attending: Physician Assistant | Admitting: Physician Assistant

## 2023-01-19 DIAGNOSIS — C3431 Malignant neoplasm of lower lobe, right bronchus or lung: Secondary | ICD-10-CM | POA: Insufficient documentation

## 2023-01-19 DIAGNOSIS — R590 Localized enlarged lymph nodes: Secondary | ICD-10-CM | POA: Insufficient documentation

## 2023-01-19 DIAGNOSIS — M899 Disorder of bone, unspecified: Secondary | ICD-10-CM | POA: Insufficient documentation

## 2023-01-19 DIAGNOSIS — G893 Neoplasm related pain (acute) (chronic): Secondary | ICD-10-CM | POA: Insufficient documentation

## 2023-01-19 DIAGNOSIS — C3490 Malignant neoplasm of unspecified part of unspecified bronchus or lung: Secondary | ICD-10-CM | POA: Insufficient documentation

## 2023-01-19 DIAGNOSIS — Z5112 Encounter for antineoplastic immunotherapy: Secondary | ICD-10-CM | POA: Insufficient documentation

## 2023-01-19 DIAGNOSIS — K5730 Diverticulosis of large intestine without perforation or abscess without bleeding: Secondary | ICD-10-CM | POA: Diagnosis not present

## 2023-01-19 DIAGNOSIS — Z21 Asymptomatic human immunodeficiency virus [HIV] infection status: Secondary | ICD-10-CM | POA: Insufficient documentation

## 2023-01-19 DIAGNOSIS — K8020 Calculus of gallbladder without cholecystitis without obstruction: Secondary | ICD-10-CM | POA: Diagnosis not present

## 2023-01-19 DIAGNOSIS — R911 Solitary pulmonary nodule: Secondary | ICD-10-CM | POA: Diagnosis not present

## 2023-01-19 MED ORDER — IOHEXOL 300 MG/ML  SOLN
100.0000 mL | Freq: Once | INTRAMUSCULAR | Status: AC | PRN
  Administered 2023-01-19: 100 mL via INTRAVENOUS

## 2023-01-19 MED ORDER — HEPARIN SOD (PORK) LOCK FLUSH 100 UNIT/ML IV SOLN
500.0000 [IU] | Freq: Once | INTRAVENOUS | Status: AC
  Administered 2023-01-19: 500 [IU] via INTRAVENOUS

## 2023-01-19 MED ORDER — HEPARIN SOD (PORK) LOCK FLUSH 100 UNIT/ML IV SOLN
INTRAVENOUS | Status: AC
  Filled 2023-01-19: qty 5

## 2023-01-23 ENCOUNTER — Other Ambulatory Visit (HOSPITAL_COMMUNITY): Payer: Self-pay

## 2023-01-23 ENCOUNTER — Other Ambulatory Visit: Payer: Self-pay

## 2023-01-23 ENCOUNTER — Encounter: Payer: Self-pay | Admitting: Pharmacy Technician

## 2023-01-27 NOTE — Progress Notes (Unsigned)
[facility] OFFICE PROGRESS NOTE  [name], DO [address] [place] [address]  DIAGNOSIS:   1) Extensive stage small cell lung cancer (T3, N3, M1 C) . She presented with large right lower lobe lung masses in addition to bulky right hilar and mediastinal lymphadenopathy as well as suspicious lytic bone lesion at L1. She was diagnosed in March 2024.  2) HIV diagnosed in March 2024 follows with Dr. Luciana Axe from infectious disease  PRIOR THERAPY: None  CURRENT THERAPY: Systemic chemotherapy with carboplatin for AUC of 4 on day 1 and etoposide 80 Mg/M2 on days 1, 2 and 3 with Imfinzi 1500 Mg on day 1 every 3 weeks with Neulasta support on day 5. First dose September 14, 2022 . She is on dose reduced chemotherapy due to her HIV. Status post 6 cycle. Starting from cycle #5, she started single agent maintenance immunotherapy with Imfinzi 1500 mg IV every 4 weeks.   INTERVAL HISTORY: [name] 67 y.o. female returns to the clinic today for a follow-up visit.  The patient is followed for her extensive stage small cell lung cancer.  She is currently on single agent maintenance immunotherapy with Imfinzi.  She tolerates it well without any concerning adverse side effects.  In the interval since last being seen, she called on 01/13/2023 reporting hemoptysis. She states this resolved since that time. She quantifies this to being around the size of a decent pace.  She was advised to seek emergency room evaluation which she did not do.  She is not on a blood thinner. She states she is supposed to be taking aspirin daily but is not. Otherwise she denies any changes in her health.  Denies any fever, chills, or night sweats. She lost about 3 pounds since last being seen but she does states she has a good appetite.  She is wondering if she is able to get complementary ensures today as she had previously in June 2024.  Denies any shortness of breath as she does not exert herself.  Denies any cough.   Denies any chest pain.  Denies any nausea, vomiting, diarrhea, or constipation.  She reports she needs to make an appointment with her eye doctor for visual changes and needing new glasses.  She follows closely with Dr. Barbaraann Cao for routine brain MRIs.  She denies any rashes or itching.  She recently had a restaging CT scan performed.  She is here today for evaluation and repeat blood work before undergoing cycle #7.    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  MEDICATIONS:  Current Outpatient Medications  Medication Sig Dispense Refill   Aspirin 325 MG CAPS Take 325 mg by mouth daily.     baclofen (LIORESAL) 10 MG tablet Take 1 tablet (10 mg total) by mouth 3 (three) times daily as needed for muscle spasms. 30 each 0   cholecalciferol (VITAMIN D3) 25 MCG (1000 UNIT) tablet Take 1,000 Units by mouth daily.     diphenoxylate-atropine (LOMOTIL) 2.5-0.025 MG tablet Take 1 tablet by mouth 4 (four) times daily as needed for diarrhea or loose stools. (Patient not taking: Reported on 11/15/2022) 30 tablet 0   dolutegravir-lamiVUDine (DOVATO) 50-300 MG tablet Take  1 tablet by mouth daily. 30 tablet 11   folic acid (FOLVITE) 1 MG tablet Take 1 tablet (1 mg total) by mouth daily. 90 tablet 0   guaifenesin (ROBITUSSIN) 100 MG/5ML syrup Take 200 mg by mouth 3 (three) times daily as needed for cough. (Patient not taking: Reported on 09/21/2022)     lidocaine-prilocaine (EMLA) cream Apply 1 Application topically as needed. 30 g 2   nicotine (NICODERM CQ) 7 mg/24hr patch Place 1 patch (7 mg total) onto the skin daily. 28 patch 0   nicotine polacrilex (NICORETTE) 4 MG gum Take 1 each (4 mg total) by mouth as needed for smoking cessation. 30 tablet 2    oxyCODONE-acetaminophen (PERCOCET/ROXICET) 5-325 MG tablet Take 1 tablet by mouth every 8 (eight) hours as needed for severe pain. 30 tablet 0   prochlorperazine (COMPAZINE) 10 MG tablet Take 1 tablet (10 mg total) by mouth every 6 (six) hours as needed. (Patient not taking: Reported on 09/14/2022) 30 tablet 2   thiamine (VITAMIN B-1) 100 MG tablet Take 1 tablet (100 mg total) by mouth daily. 90 tablet 0   No current facility-administered medications for this visit.    SURGICAL HISTORY:  Past Surgical History:  Procedure Laterality Date   BRONCHIAL NEEDLE ASPIRATION BIOPSY  09/02/2022   Procedure: BRONCHIAL NEEDLE ASPIRATION BIOPSIES;  Surgeon: Josephine Igo, DO;  Location: [facility];  Service: Cardiopulmonary;;   IR IMAGING GUIDED PORT INSERTION  09/29/2022   MULTIPLE TOOTH EXTRACTIONS     ORIF ANKLE FRACTURE  05/03/2012   Procedure: OPEN REDUCTION INTERNAL FIXATION (ORIF) ANKLE FRACTURE;  Surgeon: Toni Arthurs, MD;  Location: [facility] OR;  Service: Orthopedics;  Laterality: Left;   VIDEO BRONCHOSCOPY WITH ENDOBRONCHIAL ULTRASOUND Bilateral 09/02/2022   Procedure: VIDEO BRONCHOSCOPY WITH ENDOBRONCHIAL ULTRASOUND;  Surgeon: Josephine Igo, DO;  Location: [facility];  Service: Cardiopulmonary;  Laterality: Bilateral;    REVIEW OF SYSTEMS:   Review of Systems  Constitutional: Positive for 3 lb weight loss. Negative for appetite change, chills, fatigue, and fever.  HENT: Negative for mouth sores, nosebleeds, sore throat and trouble swallowing.   Eyes: Negative for eye problems and icterus.  Respiratory: Negative for cough, hemoptysis (resolved one episode), shortness of breath and wheezing.   Cardiovascular: Negative for chest pain and leg swelling.  Gastrointestinal: Negative for abdominal pain, constipation, diarrhea, nausea and vomiting.  Genitourinary: Negative for bladder incontinence, difficulty urinating, dysuria, frequency and hematuria.   Musculoskeletal: Negative for back pain, gait  problem, neck pain and neck stiffness.  Skin: Negative for itching and rash.  Neurological: Negative for dizziness, extremity weakness, gait problem, headaches, light-headedness and seizures.  Hematological: Negative for adenopathy. Does not bruise/bleed easily.  Psychiatric/Behavioral: Negative for confusion, depression and sleep disturbance. The patient is not nervous/anxious.     PHYSICAL EXAMINATION:  Blood pressure 125/79, pulse 94, temperature 98 F (36.7 C), temperature source Oral, resp. rate 16, weight 135 lb 8 oz (61.5 kg), SpO2 99%.  ECOG PERFORMANCE STATUS: 1  Physical Exam  Constitutional: Oriented to person, place, and time and thin appearing female and in no distress.  HENT:  Head: Normocephalic and atraumatic.  Mouth/Throat: Oropharynx is clear and moist. No oropharyngeal exudate.  Eyes: Conjunctivae are normal. Right eye exhibits no discharge. Left eye exhibits no discharge. No scleral icterus.  Neck: Normal range of motion. Neck supple.  Cardiovascular: Normal rate, regular rhythm, normal heart sounds and intact distal pulses.   Pulmonary/Chest: Effort normal and breath sounds normal. No respiratory distress. No wheezes.  No rales.  Abdominal: Soft. Bowel sounds are normal. Exhibits no distension and no mass. There is no tenderness.  Musculoskeletal: Normal range of motion. Exhibits no edema.  Lymphadenopathy:    No cervical adenopathy.  Neurological: Alert and oriented to person, place, and time. Exhibits muscle wasting. Gait normal. Coordination normal.  Skin: Skin is warm and dry. No rash noted. Not diaphoretic. No erythema. No pallor.  Psychiatric: Mood, memory and judgment normal.  Vitals reviewed.  LABORATORY DATA: Lab Results  Component Value Date   WBC 4.5 01/31/2023   HGB 11.6 (L) 01/31/2023   HCT 36.4 01/31/2023   MCV 100.6 (H) 01/31/2023   PLT 272 01/31/2023      Chemistry      Component Value Date/Time   NA 138 01/10/2023 0812   K 3.6  01/10/2023 0812   CL 111 01/10/2023 0812   CO2 24 01/10/2023 0812   BUN 9 01/10/2023 0812   CREATININE 0.63 01/10/2023 0812   CREATININE 0.53 09/21/2022 0942      Component Value Date/Time   CALCIUM 9.2 01/10/2023 0812   ALKPHOS 53 01/10/2023 0812   AST 11 (L) 01/10/2023 0812   ALT 7 01/10/2023 0812   BILITOT 0.4 01/10/2023 6010       RADIOGRAPHIC STUDIES:  CT CHEST ABDOMEN PELVIS W CONTRAST  Result Date: 01/19/2023 CLINICAL DATA:  Small-cell lung cancer restaging * Tracking Code: BO * EXAM: CT CHEST, ABDOMEN, AND PELVIS WITH CONTRAST TECHNIQUE: Multidetector CT imaging of the chest, abdomen and pelvis was performed following the standard protocol during bolus administration of intravenous contrast. RADIATION DOSE REDUCTION: This exam was performed according to the departmental dose-optimization program which includes automated exposure control, adjustment of the mA and/or kV according to patient size and/or use of iterative reconstruction technique. CONTRAST:  OMNIPAQUE IOHEXOL 300 MG/ML  SOLN COMPARISON:  CT chest abdomen pelvis, 11/11/2022 FINDINGS: CT CHEST FINDINGS Cardiovascular: Left chest port catheter. Aortic atherosclerosis. Normal heart size. Three-vessel coronary artery calcifications. No pericardial effusion. Mediastinum/Nodes: Unchanged treated, faintly calcified mediastinal and right hilar lymph nodes, pretracheal node measuring 2.0 x 1.1 cm (series 2, image 24). Thyroid gland, trachea, and esophagus demonstrate no significant findings. Lungs/Pleura: Moderate centrilobular and paraseptal emphysema. Slightly enlarged spiculated nodule of the anterior right upper lobe measuring 0.8 cm previously 0.5 cm (series 6, image 43). Almost completely resolved mass along the right major fissure, with only faint residual fissural thickening in this vicinity (series 6, image 89). No pleural effusion or pneumothorax. Musculoskeletal: No chest wall abnormality. No acute osseous findings. CT  ABDOMEN PELVIS FINDINGS Hepatobiliary: No solid liver abnormality is seen. Multiple small fluid attenuation cysts, benign, for which no further follow-up or characterization is required. Small gallstones. No gallbladder wall thickening, or biliary dilatation. Pancreas: Unremarkable. No pancreatic ductal dilatation or surrounding inflammatory changes. Spleen: Normal in size without significant abnormality. Adrenals/Urinary Tract: Adrenal glands are unremarkable. Kidneys are normal, without renal calculi, solid lesion, or hydronephrosis. Bladder is unremarkable. Stomach/Bowel: Stomach is within normal limits. Appendix appears normal. No evidence of bowel wall thickening, distention, or inflammatory changes. Sigmoid diverticula. Vascular/Lymphatic: Aortic atherosclerosis. No enlarged abdominal or pelvic lymph nodes. Reproductive: No mass or other abnormality. Other: No abdominal wall hernia or abnormality. No ascites. Musculoskeletal: No acute osseous findings. IMPRESSION: 1. Almost completely resolved mass along the right major fissure, with only faint residual fissural thickening in this vicinity consistent with treatment response. 2. Slightly enlarged spiculated nodule of the anterior right upper lobe measuring 0.8 cm previously 0.5 cm.  This may reflect a slightly worsening pulmonary metastasis, although remains substantially smaller than on initial staging examinations. Attention on follow-up. 3. Unchanged treated, faintly calcified mediastinal and right hilar lymph nodes. 4. No evidence of lymphadenopathy or metastatic disease in the abdomen or pelvis. 5. Emphysema. 6. Coronary artery disease. Aortic Atherosclerosis (ICD10-I70.0) and Emphysema (ICD10-J43.9). Electronically Signed   By: Jearld Lesch M.D.   On: 01/19/2023 15:08     ASSESSMENT/PLAN:  This is a very pleasant 67 year old female with extensive stage small cell lung cancer (T3, N3, M1 C) . She presented with large right lower lobe lung masses in  addition to bulky right hilar and mediastinal lymphadenopathy as well as suspicious lytic bone lesion at L1. She was diagnosed in March 2024.    She is currently on systemic chemotherapy with carboplatin for AUC of 4 on day 1 and etoposide 80 Mg/M2 on days 1, 2 and 3 with Neulasta support on day 5 as well as Imfinzi 1500 Mg IV on day 1 every 3 weeks. She had reduced dose of carboplatin and etoposide side because of her HIV status.  Starting with cycle #5, she started single agent maintenance immunotherapy with Imfinzi 1500 mg IV every 4 weeks she is status post 6 cycles and tolerated it well.    Labs were reviewed.   The patient recently had a restaging CT scan performed.  The patient was seen with Dr. Arbutus Ped today.  Dr. Arbutus Ped personally and independently reviewed the scan results and discussed results with the patient today.  The scan showed a positive response to treatment.  She has almost completely resolved mass along the right major fissure with only faint residual fissural thickening in this vicinity.  She has a slightly enlarged spiculated nodule in the anterior right upper lobe which enlarged by 3 mm.  We will keep a close eye on follow-up imaging.  Dr.  Arbutus Ped recommends that she continue on the same treatment at the same dose.  She will proceed with cycle #7 today as scheduled.  We will see her back for follow-up visit in 4 weeks before starting cycle #8.  We will continue taking Percocet for cancer related pain.  I will reach out to nutrition to see if we are able to give complementary Ensure.  However, I did let the patient know that I do believe that we were notified recently that we are no longer able to provide complementary supplemental protein drinks but I will verify.  The patient was advised to call immediately if she has any concerning symptoms in the interval. The patient voices understanding of current disease status and treatment options and is in agreement with the current  care plan. All questions were answered. The patient knows to call the clinic with any problems, questions or concerns. We can certainly see the patient much sooner if necessary   No orders of the defined types were placed in this encounter.    Zarianna Dicarlo L Mahogony Gilchrest, PA-C 01/31/23  ADDENDUM: Hematology/Oncology Attending: I had a face-to-face encounter with the patient today.  I reviewed her records, lab, scan and recommended her care plan.  This is a very pleasant 67 years old African-American female diagnosed with extensive stage small cell lung cancer in March 2024.  The patient also has a history of HIV followed by Dr. Earle Gell.  She started systemic chemotherapy initially with carboplatin, etoposide and Imfinzi for 4 cycles.  She is currently on maintenance treatment with single agent immunotherapy with Imfinzi status post 2 cycles.  She has been tolerating her treatment fairly well with no concerning adverse effects. She had repeat CT scan of the chest, abdomen and pelvis performed recently.  I personally and independently reviewed the scan images and discussed the results with the patient today. Her scan showed no concerning findings for disease progression and she has almost resolution of her disease except for a slightly enlarged spiculated nodule in the anterior right upper lobe that need close monitoring. I recommended for the patient to continue with her maintenance therapy for now. She will proceed with cycle #7 today. She will come back for follow-up visit in 4 weeks for evaluation with the next cycle of her treatment. The patient was advised to call immediately if she has any other concerning symptoms in the interval. The total time spent in the appointment was 30 minutes. Disclaimer: This note was dictated with voice recognition software. Similar sounding words can inadvertently be transcribed and may be missed upon review. Lajuana Matte, MD

## 2023-01-31 ENCOUNTER — Inpatient Hospital Stay: Payer: Medicare HMO

## 2023-01-31 ENCOUNTER — Inpatient Hospital Stay: Payer: Medicare HMO | Admitting: Physician Assistant

## 2023-01-31 VITALS — BP 125/79 | HR 94 | Temp 98.0°F | Resp 16 | Wt 135.5 lb

## 2023-01-31 DIAGNOSIS — Z5112 Encounter for antineoplastic immunotherapy: Secondary | ICD-10-CM | POA: Diagnosis not present

## 2023-01-31 DIAGNOSIS — G893 Neoplasm related pain (acute) (chronic): Secondary | ICD-10-CM | POA: Insufficient documentation

## 2023-01-31 DIAGNOSIS — C3490 Malignant neoplasm of unspecified part of unspecified bronchus or lung: Secondary | ICD-10-CM

## 2023-01-31 DIAGNOSIS — Z95828 Presence of other vascular implants and grafts: Secondary | ICD-10-CM

## 2023-01-31 DIAGNOSIS — Z21 Asymptomatic human immunodeficiency virus [HIV] infection status: Secondary | ICD-10-CM | POA: Diagnosis not present

## 2023-01-31 DIAGNOSIS — R590 Localized enlarged lymph nodes: Secondary | ICD-10-CM | POA: Insufficient documentation

## 2023-01-31 DIAGNOSIS — C3431 Malignant neoplasm of lower lobe, right bronchus or lung: Secondary | ICD-10-CM | POA: Insufficient documentation

## 2023-01-31 DIAGNOSIS — M899 Disorder of bone, unspecified: Secondary | ICD-10-CM | POA: Diagnosis not present

## 2023-01-31 LAB — CBC WITH DIFFERENTIAL (CANCER CENTER ONLY)
Abs Immature Granulocytes: 0.01 10*3/uL (ref 0.00–0.07)
Basophils Absolute: 0 10*3/uL (ref 0.0–0.1)
Basophils Relative: 0 %
Eosinophils Absolute: 0.1 10*3/uL (ref 0.0–0.5)
Eosinophils Relative: 2 %
HCT: 36.4 % (ref 36.0–46.0)
Hemoglobin: 11.6 g/dL — ABNORMAL LOW (ref 12.0–15.0)
Immature Granulocytes: 0 %
Lymphocytes Relative: 27 %
Lymphs Abs: 1.2 10*3/uL (ref 0.7–4.0)
MCH: 32 pg (ref 26.0–34.0)
MCHC: 31.9 g/dL (ref 30.0–36.0)
MCV: 100.6 fL — ABNORMAL HIGH (ref 80.0–100.0)
Monocytes Absolute: 0.3 10*3/uL (ref 0.1–1.0)
Monocytes Relative: 7 %
Neutro Abs: 2.8 10*3/uL (ref 1.7–7.7)
Neutrophils Relative %: 64 %
Platelet Count: 272 10*3/uL (ref 150–400)
RBC: 3.62 MIL/uL — ABNORMAL LOW (ref 3.87–5.11)
RDW: 12.4 % (ref 11.5–15.5)
WBC Count: 4.5 10*3/uL (ref 4.0–10.5)
nRBC: 0 % (ref 0.0–0.2)

## 2023-01-31 LAB — CMP (CANCER CENTER ONLY)
ALT: 9 U/L (ref 0–44)
AST: 13 U/L — ABNORMAL LOW (ref 15–41)
Albumin: 3.7 g/dL (ref 3.5–5.0)
Alkaline Phosphatase: 53 U/L (ref 38–126)
Anion gap: 2 — ABNORMAL LOW (ref 5–15)
BUN: 19 mg/dL (ref 8–23)
CO2: 25 mmol/L (ref 22–32)
Calcium: 9.2 mg/dL (ref 8.9–10.3)
Chloride: 111 mmol/L (ref 98–111)
Creatinine: 1.08 mg/dL — ABNORMAL HIGH (ref 0.44–1.00)
GFR, Estimated: 57 mL/min — ABNORMAL LOW (ref >60)
Glucose, Bld: 95 mg/dL (ref 70–99)
Potassium: 4.3 mmol/L (ref 3.5–5.1)
Sodium: 138 mmol/L (ref 135–145)
Total Bilirubin: 0.4 mg/dL (ref 0.3–1.2)
Total Protein: 8.7 g/dL — ABNORMAL HIGH (ref 6.5–8.1)

## 2023-01-31 MED ORDER — SODIUM CHLORIDE 0.9 % IV SOLN
1500.0000 mg | Freq: Once | INTRAVENOUS | Status: AC
  Administered 2023-01-31: 1500 mg via INTRAVENOUS
  Filled 2023-01-31: qty 30

## 2023-01-31 MED ORDER — SODIUM CHLORIDE 0.9% FLUSH
10.0000 mL | Freq: Once | INTRAVENOUS | Status: AC
  Administered 2023-01-31: 10 mL

## 2023-01-31 MED ORDER — SODIUM CHLORIDE 0.9% FLUSH
10.0000 mL | INTRAVENOUS | Status: DC | PRN
  Administered 2023-01-31: 10 mL

## 2023-01-31 MED ORDER — HEPARIN SOD (PORK) LOCK FLUSH 100 UNIT/ML IV SOLN
500.0000 [IU] | Freq: Once | INTRAVENOUS | Status: AC | PRN
  Administered 2023-01-31: 500 [IU]

## 2023-01-31 MED ORDER — SODIUM CHLORIDE 0.9 % IV SOLN: Freq: Once | INTRAVENOUS | Status: AC

## 2023-01-31 NOTE — Patient Instructions (Signed)

## 2023-02-01 ENCOUNTER — Other Ambulatory Visit: Payer: Self-pay

## 2023-02-01 ENCOUNTER — Telehealth: Payer: Self-pay

## 2023-02-01 ENCOUNTER — Telehealth: Payer: Self-pay | Admitting: Physician Assistant

## 2023-02-01 ENCOUNTER — Other Ambulatory Visit (HOSPITAL_COMMUNITY): Payer: Self-pay

## 2023-02-01 NOTE — Telephone Encounter (Signed)
I called the patient's niece (POA) and reviewed the scan from yesterday. I answered her questions. She was appreciative of the call.

## 2023-02-01 NOTE — Telephone Encounter (Signed)
[name], [name] for [name] called to get scan results. She was not able to come to appt yesterday and wanted an update on the scan. She left a message with Vaslow but he did not order the scan so thought it would be best to send to you. Lorayne Marek, RN

## 2023-02-02 ENCOUNTER — Telehealth: Payer: Self-pay | Admitting: Medical Oncology

## 2023-02-02 ENCOUNTER — Other Ambulatory Visit: Payer: Self-pay | Admitting: Physician Assistant

## 2023-02-02 ENCOUNTER — Other Ambulatory Visit: Payer: Self-pay

## 2023-02-02 ENCOUNTER — Other Ambulatory Visit (HOSPITAL_COMMUNITY): Payer: Self-pay

## 2023-02-02 DIAGNOSIS — C3490 Malignant neoplasm of unspecified part of unspecified bronchus or lung: Secondary | ICD-10-CM

## 2023-02-02 MED ORDER — OXYCODONE-ACETAMINOPHEN 5-325 MG PO TABS
1.0000 | ORAL_TABLET | Freq: Three times a day (TID) | ORAL | 0 refills | Status: DC | PRN
Start: 2023-02-02 — End: 2023-07-18
  Filled 2023-02-02: qty 30, 10d supply, fill #0

## 2023-02-02 NOTE — Telephone Encounter (Signed)
Per Dr. Liana Gerold RN -"This patient called & is asking for pain medication to be sent to [facility] & delivered to her. She said her phone is disconnected now but that she will call back later"   Please write on rx " deliver to pt"

## 2023-02-02 NOTE — Telephone Encounter (Signed)
Bone/ Joint pain - requested pain med refill.  Asking for Ensure.

## 2023-02-03 ENCOUNTER — Other Ambulatory Visit (HOSPITAL_COMMUNITY): Payer: Self-pay

## 2023-02-17 ENCOUNTER — Ambulatory Visit: Payer: Medicare HMO | Admitting: Internal Medicine

## 2023-02-17 DIAGNOSIS — R6889 Other general symptoms and signs: Secondary | ICD-10-CM | POA: Diagnosis not present

## 2023-02-20 ENCOUNTER — Other Ambulatory Visit (HOSPITAL_COMMUNITY): Payer: Self-pay

## 2023-02-22 ENCOUNTER — Other Ambulatory Visit (HOSPITAL_COMMUNITY): Payer: Self-pay

## 2023-02-22 NOTE — Progress Notes (Signed)
[facility] OFFICE PROGRESS NOTE  [name], DO [address] [place] [address]  DIAGNOSIS:  1) Extensive stage small cell lung cancer (T3, N3, M1 C) . She presented with large right lower lobe lung masses in addition to bulky right hilar and mediastinal lymphadenopathy as well as suspicious lytic bone lesion at L1. She was diagnosed in March 2024.  2) HIV diagnosed in March 2024 follows with Dr. Luciana Axe from infectious disease  PRIOR THERAPY: None   CURRENT THERAPY: Systemic chemotherapy with carboplatin for AUC of 4 on day 1 and etoposide 80 Mg/M2 on days 1, 2 and 3 with Imfinzi 1500 Mg on day 1 every 3 weeks with Neulasta support on day 5. First dose September 14, 2022 . She is on dose reduced chemotherapy due to her HIV. Status post 7 cycle. Starting from cycle #5, she started single agent maintenance immunotherapy with Imfinzi 1500 mg IV every 4 weeks.   INTERVAL HISTORY: [name] 67 y.o. female returns to the clinic today for a follow-up visit.  The patient is followed for her extensive stage small cell lung cancer.  She is currently on single agent maintenance immunotherapy with Imfinzi.  She tolerates it well without any concerning adverse side effects. She denies any changes in her health.  Denies any fever, chills, or night sweats.  Her weight is stable and she reports she has a good appetite.  However she is disappointed that she did not gain more weight.  She is hoping she can get a prescription of her insurance.  Denies any shortness of breath as she does not exert herself.  She was on supplemental oxygen when she was first diagnosed with lung cancer but has been off supplemental oxygen since early in her diagnosis.  Denies any cough.  Denies any chest pain.  Denies any nausea, vomiting, diarrhea, or constipation.  She reports she needs to make an appointment with her eye doctor for visual changes and needing new glasses; however, she supposed to get some dental  work performed tomorrow and needs to address her dental work before seeing the eye doctor.  He has a few mild "bumps" on her upper extremities.  She uses Betadine spray because she feels like this helps with the itch.  She also uses lotion.  She follows closely with Dr. Barbaraann Cao for routine brain MRIs.  She is here today for evaluation and repeat blood work before undergoing cycle #8.   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  MEDICATIONS:  Current Outpatient Medications  Medication Sig Dispense Refill   Aspirin 325 MG CAPS Take 325 mg by mouth daily.     baclofen (LIORESAL) 10 MG tablet Take 1 tablet (10 mg total) by mouth 3 (three) times daily as needed for muscle spasms. 30 each 0   cholecalciferol (VITAMIN D3) 25 MCG (1000 UNIT) tablet Take 1,000 Units by mouth daily.     diphenoxylate-atropine (LOMOTIL) 2.5-0.025 MG tablet Take 1 tablet by mouth 4 (four) times daily as needed for diarrhea or loose stools. 30 tablet 0   dolutegravir-lamiVUDine (DOVATO) 50-300 MG tablet Take 1 tablet by mouth daily. 30 tablet 11   folic acid (FOLVITE) 1 MG tablet  Take 1 tablet (1 mg total) by mouth daily. 90 tablet 0   guaifenesin (ROBITUSSIN) 100 MG/5ML syrup Take 200 mg by mouth 3 (three) times daily as needed for cough.     lidocaine-prilocaine (EMLA) cream Apply 1 Application topically as needed. 30 g 2   nicotine (NICODERM CQ) 7 mg/24hr patch Place 1 patch (7 mg total) onto the skin daily. 28 patch 0   nicotine polacrilex (NICORETTE) 4 MG gum Take 1 each (4 mg total) by mouth as needed for smoking cessation. 30 tablet 2   oxyCODONE-acetaminophen (PERCOCET/ROXICET) 5-325 MG tablet Take 1 tablet by mouth every 8 (eight) hours as needed for severe pain. 30  tablet 0   prochlorperazine (COMPAZINE) 10 MG tablet Take 1 tablet (10 mg total) by mouth every 6 (six) hours as needed. 30 tablet 2   thiamine (VITAMIN B-1) 100 MG tablet Take 1 tablet (100 mg total) by mouth daily. 90 tablet 0   No current facility-administered medications for this visit.    SURGICAL HISTORY:  Past Surgical History:  Procedure Laterality Date   BRONCHIAL NEEDLE ASPIRATION BIOPSY  09/02/2022   Procedure: BRONCHIAL NEEDLE ASPIRATION BIOPSIES;  Surgeon: Josephine Igo, DO;  Location: [facility];  Service: Cardiopulmonary;;   IR IMAGING GUIDED PORT INSERTION  09/29/2022   MULTIPLE TOOTH EXTRACTIONS     ORIF ANKLE FRACTURE  05/03/2012   Procedure: OPEN REDUCTION INTERNAL FIXATION (ORIF) ANKLE FRACTURE;  Surgeon: Toni Arthurs, MD;  Location: [facility];  Service: Orthopedics;  Laterality: Left;   VIDEO BRONCHOSCOPY WITH ENDOBRONCHIAL ULTRASOUND Bilateral 09/02/2022   Procedure: VIDEO BRONCHOSCOPY WITH ENDOBRONCHIAL ULTRASOUND;  Surgeon: Josephine Igo, DO;  Location: [facility];  Service: Cardiopulmonary;  Laterality: Bilateral;    REVIEW OF SYSTEMS:   Review of Systems  Constitutional: Negative for appetite change, chills, fatigue, fever and unexpected weight change.  HENT: Negative for mouth sores, nosebleeds, sore throat and trouble swallowing.   Eyes: Negative for eye problems and icterus.  Respiratory: Negative for cough, hemoptysis, shortness of breath and wheezing.   Cardiovascular: Negative for chest pain and leg swelling.  Gastrointestinal: Negative for abdominal pain, constipation, diarrhea, nausea and vomiting.  Genitourinary: Negative for bladder incontinence, difficulty urinating, dysuria, frequency and hematuria.   Musculoskeletal: Negative for back pain, gait problem, neck pain and neck stiffness.  Skin: Positive for dry skin and occasional mild itching. Neurological: Negative for dizziness, extremity weakness, gait problem, headaches, light-headedness and  seizures.  Hematological: Negative for adenopathy. Does not bruise/bleed easily.  Psychiatric/Behavioral: Negative for confusion, depression and sleep disturbance. The patient is not nervous/anxious.     PHYSICAL EXAMINATION:  Blood pressure 121/81, pulse 80, temperature 97.8 F (36.6 C), temperature source Oral, resp. rate 15, weight 135 lb 3.2 oz (61.3 kg), SpO2 100%.  ECOG PERFORMANCE STATUS: 1  Physical Exam  Constitutional: Oriented to person, place, and time and well-developed, well-nourished, and in no distress. No distress.  HENT:  Head: Normocephalic and atraumatic.  Mouth/Throat: Oropharynx is clear and moist. No oropharyngeal exudate.  Eyes: Conjunctivae are normal. Right eye exhibits no discharge. Left eye exhibits no discharge. No scleral icterus.  Neck: Normal range of motion. Neck supple.  Cardiovascular: Normal rate, regular rhythm, normal heart sounds and intact distal pulses.   Pulmonary/Chest: Effort normal and breath sounds normal. No respiratory distress. No wheezes. No rales.  Abdominal: Soft. Bowel sounds are normal. Exhibits no distension and no mass. There is no tenderness.  Musculoskeletal: Normal range of motion. Exhibits no edema.  Lymphadenopathy:  No cervical adenopathy.  Neurological: Alert and oriented to person, place, and time. Exhibits normal muscle tone. Gait normal. Coordination normal.  Skin: Skin is warm and dry. No significant rashes noted.  Some dry skin on her upper extremities.  Not diaphoretic. No erythema. No pallor.  Psychiatric: Mood, memory and judgment normal.  Vitals reviewed.  LABORATORY DATA: Lab Results  Component Value Date   WBC 4.5 01/31/2023   HGB 11.6 (L) 01/31/2023   HCT 36.4 01/31/2023   MCV 100.6 (H) 01/31/2023   PLT 272 01/31/2023      Chemistry      Component Value Date/Time   NA 138 01/31/2023 1105   K 4.3 01/31/2023 1105   CL 111 01/31/2023 1105   CO2 25 01/31/2023 1105   BUN 19 01/31/2023 1105    CREATININE 1.08 (H) 01/31/2023 1105   CREATININE 0.53 09/21/2022 0942      Component Value Date/Time   CALCIUM 9.2 01/31/2023 1105   ALKPHOS 53 01/31/2023 1105   AST 13 (L) 01/31/2023 1105   ALT 9 01/31/2023 1105   BILITOT 0.4 01/31/2023 1105       RADIOGRAPHIC STUDIES:  No results found.   ASSESSMENT/PLAN:  This is a very pleasant 67 year old female with extensive stage small cell lung cancer (T3, N3, M1 C) . She presented with large right lower lobe lung masses in addition to bulky right hilar and mediastinal lymphadenopathy as well as suspicious lytic bone lesion at L1. She was diagnosed in March 2024.    She is currently on systemic chemotherapy with carboplatin for AUC of 4 on day 1 and etoposide 80 Mg/M2 on days 1, 2 and 3 with Neulasta support on day 5 as well as Imfinzi 1500 Mg IV on day 1 every 3 weeks. She had reduced dose of carboplatin and etoposide side because of her HIV status.  Starting with cycle #5, she started single agent maintenance immunotherapy with Imfinzi 1500 mg IV every 4 weeks she is status post 7 cycles and tolerated it well.   He was reviewed.  Her CMP is pending at this time.  As long as her CMP is within normal limits, recommend that she proceed with cycle number 8 today as scheduled.  Will see her back for follow-up visit in 4 weeks for evaluation repeat blood work before undergoing cycle #9.  We will continue taking Percocet for cancer related pain.   We will check on seeing if she can have a prescription for Ensure.  She is advised to use lotion on her dry skin and hydrocortisone cream if needed for itching.  The patient was advised to call immediately if she has any concerning symptoms in the interval. The patient voices understanding of current disease status and treatment options and is in agreement with the current care plan. All questions were answered. The patient knows to call the clinic with any problems, questions or concerns. We can  certainly see the patient much sooner if necessary  No orders of the defined types were placed in this encounter.   The total time spent in the appointment was 20-29 minutes  Jeanenne Licea L Arloa Prak, PA-C 02/28/23

## 2023-02-24 ENCOUNTER — Other Ambulatory Visit (HOSPITAL_COMMUNITY): Payer: Self-pay

## 2023-02-24 ENCOUNTER — Encounter (HOSPITAL_COMMUNITY): Payer: Self-pay

## 2023-02-25 ENCOUNTER — Other Ambulatory Visit (HOSPITAL_COMMUNITY): Payer: Self-pay

## 2023-02-27 ENCOUNTER — Other Ambulatory Visit (HOSPITAL_COMMUNITY): Payer: Self-pay

## 2023-02-27 ENCOUNTER — Other Ambulatory Visit: Payer: Self-pay

## 2023-02-28 ENCOUNTER — Other Ambulatory Visit: Payer: Self-pay

## 2023-02-28 ENCOUNTER — Inpatient Hospital Stay: Payer: Medicare HMO

## 2023-02-28 ENCOUNTER — Inpatient Hospital Stay: Payer: Medicare HMO | Attending: Internal Medicine

## 2023-02-28 ENCOUNTER — Other Ambulatory Visit (HOSPITAL_COMMUNITY): Payer: Self-pay

## 2023-02-28 ENCOUNTER — Inpatient Hospital Stay (HOSPITAL_BASED_OUTPATIENT_CLINIC_OR_DEPARTMENT_OTHER): Payer: Medicare HMO | Admitting: Physician Assistant

## 2023-02-28 VITALS — BP 121/81 | HR 80 | Temp 97.8°F | Resp 15 | Wt 135.2 lb

## 2023-02-28 VITALS — BP 104/60 | HR 97

## 2023-02-28 DIAGNOSIS — Z5112 Encounter for antineoplastic immunotherapy: Secondary | ICD-10-CM | POA: Insufficient documentation

## 2023-02-28 DIAGNOSIS — G893 Neoplasm related pain (acute) (chronic): Secondary | ICD-10-CM | POA: Insufficient documentation

## 2023-02-28 DIAGNOSIS — Z95828 Presence of other vascular implants and grafts: Secondary | ICD-10-CM

## 2023-02-28 DIAGNOSIS — Z21 Asymptomatic human immunodeficiency virus [HIV] infection status: Secondary | ICD-10-CM | POA: Diagnosis not present

## 2023-02-28 DIAGNOSIS — M899 Disorder of bone, unspecified: Secondary | ICD-10-CM | POA: Diagnosis not present

## 2023-02-28 DIAGNOSIS — C3431 Malignant neoplasm of lower lobe, right bronchus or lung: Secondary | ICD-10-CM | POA: Insufficient documentation

## 2023-02-28 DIAGNOSIS — C3490 Malignant neoplasm of unspecified part of unspecified bronchus or lung: Secondary | ICD-10-CM

## 2023-02-28 DIAGNOSIS — L853 Xerosis cutis: Secondary | ICD-10-CM | POA: Diagnosis not present

## 2023-02-28 LAB — CMP (CANCER CENTER ONLY)
ALT: 8 U/L (ref 0–44)
AST: 13 U/L — ABNORMAL LOW (ref 15–41)
Albumin: 3.8 g/dL (ref 3.5–5.0)
Alkaline Phosphatase: 61 U/L (ref 38–126)
Anion gap: 4 — ABNORMAL LOW (ref 5–15)
BUN: 13 mg/dL (ref 8–23)
CO2: 25 mmol/L (ref 22–32)
Calcium: 9.2 mg/dL (ref 8.9–10.3)
Chloride: 109 mmol/L (ref 98–111)
Creatinine: 0.63 mg/dL (ref 0.44–1.00)
GFR, Estimated: >60 mL/min (ref >60)
Glucose, Bld: 94 mg/dL (ref 70–99)
Potassium: 3.6 mmol/L (ref 3.5–5.1)
Sodium: 138 mmol/L (ref 135–145)
Total Bilirubin: 0.3 mg/dL (ref 0.3–1.2)
Total Protein: 8.5 g/dL — ABNORMAL HIGH (ref 6.5–8.1)

## 2023-02-28 LAB — CBC WITH DIFFERENTIAL (CANCER CENTER ONLY)
Abs Immature Granulocytes: 0.01 10*3/uL (ref 0.00–0.07)
Basophils Absolute: 0 10*3/uL (ref 0.0–0.1)
Basophils Relative: 1 %
Eosinophils Absolute: 0.1 10*3/uL (ref 0.0–0.5)
Eosinophils Relative: 2 %
HCT: 37.7 % (ref 36.0–46.0)
Hemoglobin: 11.9 g/dL — ABNORMAL LOW (ref 12.0–15.0)
Immature Granulocytes: 0 %
Lymphocytes Relative: 33 %
Lymphs Abs: 1.3 10*3/uL (ref 0.7–4.0)
MCH: 31.5 pg (ref 26.0–34.0)
MCHC: 31.6 g/dL (ref 30.0–36.0)
MCV: 99.7 fL (ref 80.0–100.0)
Monocytes Absolute: 0.3 10*3/uL (ref 0.1–1.0)
Monocytes Relative: 8 %
Neutro Abs: 2.3 10*3/uL (ref 1.7–7.7)
Neutrophils Relative %: 56 %
Platelet Count: 242 10*3/uL (ref 150–400)
RBC: 3.78 MIL/uL — ABNORMAL LOW (ref 3.87–5.11)
RDW: 12.7 % (ref 11.5–15.5)
WBC Count: 4.1 10*3/uL (ref 4.0–10.5)
nRBC: 0 % (ref 0.0–0.2)

## 2023-02-28 MED ORDER — SODIUM CHLORIDE 0.9 % IV SOLN
1500.0000 mg | Freq: Once | INTRAVENOUS | Status: AC
  Administered 2023-02-28: 1500 mg via INTRAVENOUS
  Filled 2023-02-28: qty 30

## 2023-02-28 MED ORDER — SODIUM CHLORIDE 0.9% FLUSH
10.0000 mL | Freq: Once | INTRAVENOUS | Status: AC
  Administered 2023-02-28: 10 mL

## 2023-02-28 MED ORDER — SODIUM CHLORIDE 0.9% FLUSH
10.0000 mL | INTRAVENOUS | Status: DC | PRN
  Administered 2023-02-28: 10 mL

## 2023-02-28 MED ORDER — HEPARIN SOD (PORK) LOCK FLUSH 100 UNIT/ML IV SOLN
500.0000 [IU] | Freq: Once | INTRAVENOUS | Status: AC | PRN
  Administered 2023-02-28: 500 [IU]

## 2023-02-28 MED ORDER — SODIUM CHLORIDE 0.9 % IV SOLN: Freq: Once | INTRAVENOUS | Status: AC

## 2023-02-28 NOTE — Patient Instructions (Addendum)
[facility]  Discharge Instructions: Thank you for choosing [facility] to provide your oncology and hematology care.   If you have a lab appointment with the Cancer Center, please go directly to the Cancer Center and check in at the registration area.   Wear comfortable clothing and clothing appropriate for easy access to any Portacath or PICC line.   We strive to give you quality time with your provider. You may need to reschedule your appointment if you arrive late (15 or more minutes).  Arriving late affects you and other patients whose appointments are after yours.  Also, if you miss three or more appointments without notifying the office, you may be dismissed from the clinic at the provider's discretion.      For prescription refill requests, have your pharmacy contact our office and allow 72 hours for refills to be completed.    Today you received the following chemotherapy and/or immunotherapy agents: Imfinzi      To help prevent nausea and vomiting after your treatment, we encourage you to take your nausea medication as directed.  BELOW ARE SYMPTOMS THAT SHOULD BE REPORTED IMMEDIATELY: *FEVER GREATER THAN 100.4 F (38 C) OR HIGHER *CHILLS OR SWEATING *NAUSEA AND VOMITING THAT IS NOT CONTROLLED WITH YOUR NAUSEA MEDICATION *UNUSUAL SHORTNESS OF BREATH *UNUSUAL BRUISING OR BLEEDING *URINARY PROBLEMS (pain or burning when urinating, or frequent urination) *BOWEL PROBLEMS (unusual diarrhea, constipation, pain near the anus) TENDERNESS IN MOUTH AND THROAT WITH OR WITHOUT PRESENCE OF ULCERS (sore throat, sores in mouth, or a toothache) UNUSUAL RASH, SWELLING OR PAIN  UNUSUAL VAGINAL DISCHARGE OR ITCHING   Items with * indicate a potential emergency and should be followed up as soon as possible or go to the Emergency Department if any problems should occur.  Please show the CHEMOTHERAPY ALERT CARD or IMMUNOTHERAPY ALERT CARD at  check-in to the Emergency Department and triage nurse.  Should you have questions after your visit or need to cancel or reschedule your appointment, please contact [facility]  Dept: [phone]  and follow the prompts.  Office hours are 8:00 a.m. to 4:30 p.m. Monday - Friday. Please note that voicemails left after 4:00 p.m. may not be returned until the following business day.  We are closed weekends and major holidays. You have access to a nurse at all times for urgent questions. Please call the main number to the clinic Dept: [phone] and follow the prompts.   For any non-urgent questions, you may also contact your provider using MyChart. We now offer e-Visits for anyone 71 and older to request care online for non-urgent symptoms. For details visit [email or web address].   Also download the MyChart app! Go to the app store, search "MyChart", open the app, select [facility], and log in with your MyChart username and password.

## 2023-03-01 DIAGNOSIS — R6889 Other general symptoms and signs: Secondary | ICD-10-CM | POA: Diagnosis not present

## 2023-03-02 ENCOUNTER — Other Ambulatory Visit (HOSPITAL_COMMUNITY): Payer: Self-pay

## 2023-03-07 ENCOUNTER — Ambulatory Visit: Payer: Medicare HMO | Admitting: Internal Medicine

## 2023-03-16 ENCOUNTER — Ambulatory Visit: Payer: Medicare HMO | Admitting: Internal Medicine

## 2023-03-16 ENCOUNTER — Encounter: Payer: Self-pay | Admitting: Internal Medicine

## 2023-03-16 ENCOUNTER — Other Ambulatory Visit (HOSPITAL_COMMUNITY): Payer: Self-pay

## 2023-03-16 ENCOUNTER — Other Ambulatory Visit: Payer: Self-pay | Admitting: Physician Assistant

## 2023-03-16 ENCOUNTER — Other Ambulatory Visit: Payer: Self-pay

## 2023-03-16 VITALS — BP 98/63 | HR 88 | Temp 97.8°F | Wt 134.5 lb

## 2023-03-16 DIAGNOSIS — Z21 Asymptomatic human immunodeficiency virus [HIV] infection status: Secondary | ICD-10-CM | POA: Diagnosis not present

## 2023-03-16 DIAGNOSIS — B20 Human immunodeficiency virus [HIV] disease: Secondary | ICD-10-CM | POA: Diagnosis not present

## 2023-03-16 DIAGNOSIS — Z23 Encounter for immunization: Secondary | ICD-10-CM

## 2023-03-16 NOTE — Assessment & Plan Note (Signed)
She has been doing well.  I did emphasize the need to take daily without missing to avoid chance of resistance.   Will check her labs today and she can rtc in 4 months.  Flu and CoVID given today  I have personally spent 33 minutes involved in face-to-face and non-face-to-face activities for this patient on the day of the visit. Professional time spent includes the following activities: Preparing to see the patient (review of tests), Obtaining and/or reviewing separately obtained history (admission/discharge record), Performing a medically appropriate examination and/or evaluation , Ordering medications/tests/procedures, referring and communicating with other health care professionals, Documenting clinical information in the EMR, Independently interpreting results (not separately reported), Communicating results to the patient/family/caregiver, Counseling and educating the patient/family/caregiver and Care coordination (not separately reported).

## 2023-03-16 NOTE — Progress Notes (Signed)
Subjective:    Patient ID: [name], female    DOB: March 29, 1956, 67 y.o.   MRN: [id number]  HPI [name] is here for follow up of HIV She continues on Dovato with no concerns.  She does miss occcassional doses, 4 since last visit.  She continues in care for her lung cancer.  No questions today   Review of Systems  Constitutional:  Negative for fatigue.  Gastrointestinal:  Negative for diarrhea and nausea.  Skin:  Negative for rash.       Objective:   Physical Exam Eyes:     General: No scleral icterus. Pulmonary:     Effort: Pulmonary effort is normal.  Neurological:     Mental Status: She is alert.   SH: no tobacco        Assessment & Plan:

## 2023-03-17 ENCOUNTER — Other Ambulatory Visit: Payer: Self-pay

## 2023-03-17 LAB — T-HELPER CELL (CD4) - (RCID CLINIC ONLY)
CD4 % Helper T Cell: 43 % (ref 33–65)
CD4 T Cell Abs: 421 /uL (ref 400–1790)

## 2023-03-18 LAB — HIV-1 RNA QUANT-NO REFLEX-BLD
HIV 1 RNA Quant: <20 {copies}/mL — ABNORMAL HIGH
HIV-1 RNA Quant, Log: <1.3 {Log} — ABNORMAL HIGH

## 2023-03-20 ENCOUNTER — Other Ambulatory Visit (HOSPITAL_COMMUNITY): Payer: Self-pay

## 2023-03-22 ENCOUNTER — Other Ambulatory Visit (HOSPITAL_COMMUNITY): Payer: Self-pay

## 2023-03-22 ENCOUNTER — Other Ambulatory Visit (HOSPITAL_COMMUNITY): Payer: Self-pay | Admitting: Pharmacy Technician

## 2023-03-22 NOTE — Progress Notes (Signed)
Specialty Pharmacy Refill Coordination Note  [name] is a 67 y.o. female contacted today regarding refills of specialty medication(s) Dolutegravir-Lamivudine   Patient requested Delivery   Delivery date: 03/28/23   Verified address: [address], [address]  [place]   Medication will be filled on 03/27/23.

## 2023-03-27 ENCOUNTER — Other Ambulatory Visit: Payer: Self-pay

## 2023-03-28 ENCOUNTER — Encounter: Payer: Self-pay | Admitting: Internal Medicine

## 2023-03-28 ENCOUNTER — Inpatient Hospital Stay: Payer: Medicare HMO

## 2023-03-28 ENCOUNTER — Inpatient Hospital Stay: Payer: Medicare HMO | Attending: Internal Medicine | Admitting: Internal Medicine

## 2023-03-28 ENCOUNTER — Inpatient Hospital Stay: Payer: Medicare HMO | Attending: Internal Medicine

## 2023-03-28 VITALS — BP 121/73 | HR 94 | Temp 97.6°F | Resp 17 | Ht 69.0 in | Wt 135.1 lb

## 2023-03-28 VITALS — BP 114/63 | HR 79 | Resp 17

## 2023-03-28 DIAGNOSIS — M899 Disorder of bone, unspecified: Secondary | ICD-10-CM | POA: Diagnosis not present

## 2023-03-28 DIAGNOSIS — B20 Human immunodeficiency virus [HIV] disease: Secondary | ICD-10-CM | POA: Insufficient documentation

## 2023-03-28 DIAGNOSIS — Z79624 Long term (current) use of inhibitors of nucleotide synthesis: Secondary | ICD-10-CM | POA: Insufficient documentation

## 2023-03-28 DIAGNOSIS — Z5112 Encounter for antineoplastic immunotherapy: Secondary | ICD-10-CM | POA: Diagnosis not present

## 2023-03-28 DIAGNOSIS — C3490 Malignant neoplasm of unspecified part of unspecified bronchus or lung: Secondary | ICD-10-CM | POA: Diagnosis not present

## 2023-03-28 DIAGNOSIS — C3431 Malignant neoplasm of lower lobe, right bronchus or lung: Secondary | ICD-10-CM

## 2023-03-28 DIAGNOSIS — Z95828 Presence of other vascular implants and grafts: Secondary | ICD-10-CM

## 2023-03-28 LAB — CMP (CANCER CENTER ONLY)
ALT: 10 U/L (ref 0–44)
AST: 17 U/L (ref 15–41)
Albumin: 3.9 g/dL (ref 3.5–5.0)
Alkaline Phosphatase: 67 U/L (ref 38–126)
Anion gap: 5 (ref 5–15)
BUN: 12 mg/dL (ref 8–23)
CO2: 27 mmol/L (ref 22–32)
Calcium: 10 mg/dL (ref 8.9–10.3)
Chloride: 107 mmol/L (ref 98–111)
Creatinine: 0.8 mg/dL (ref 0.44–1.00)
GFR, Estimated: >60 mL/min (ref >60)
Glucose, Bld: 105 mg/dL — ABNORMAL HIGH (ref 70–99)
Potassium: 3.8 mmol/L (ref 3.5–5.1)
Sodium: 139 mmol/L (ref 135–145)
Total Bilirubin: 0.5 mg/dL (ref 0.3–1.2)
Total Protein: 8.9 g/dL — ABNORMAL HIGH (ref 6.5–8.1)

## 2023-03-28 LAB — CBC WITH DIFFERENTIAL (CANCER CENTER ONLY)
Abs Immature Granulocytes: 0.01 10*3/uL (ref 0.00–0.07)
Basophils Absolute: 0 10*3/uL (ref 0.0–0.1)
Basophils Relative: 0 %
Eosinophils Absolute: 0.1 10*3/uL (ref 0.0–0.5)
Eosinophils Relative: 3 %
HCT: 40.3 % (ref 36.0–46.0)
Hemoglobin: 13.1 g/dL (ref 12.0–15.0)
Immature Granulocytes: 0 %
Lymphocytes Relative: 25 %
Lymphs Abs: 1.2 10*3/uL (ref 0.7–4.0)
MCH: 32 pg (ref 26.0–34.0)
MCHC: 32.5 g/dL (ref 30.0–36.0)
MCV: 98.3 fL (ref 80.0–100.0)
Monocytes Absolute: 0.3 10*3/uL (ref 0.1–1.0)
Monocytes Relative: 6 %
Neutro Abs: 3.2 10*3/uL (ref 1.7–7.7)
Neutrophils Relative %: 66 %
Platelet Count: 260 10*3/uL (ref 150–400)
RBC: 4.1 MIL/uL (ref 3.87–5.11)
RDW: 13.3 % (ref 11.5–15.5)
WBC Count: 4.8 10*3/uL (ref 4.0–10.5)
nRBC: 0 % (ref 0.0–0.2)

## 2023-03-28 MED ORDER — SODIUM CHLORIDE 0.9 % IV SOLN
1500.0000 mg | Freq: Once | INTRAVENOUS | Status: AC
  Administered 2023-03-28: 1500 mg via INTRAVENOUS
  Filled 2023-03-28: qty 30

## 2023-03-28 MED ORDER — SODIUM CHLORIDE 0.9% FLUSH
10.0000 mL | Freq: Once | INTRAVENOUS | Status: AC
  Administered 2023-03-28: 10 mL

## 2023-03-28 MED ORDER — HEPARIN SOD (PORK) LOCK FLUSH 100 UNIT/ML IV SOLN
500.0000 [IU] | Freq: Once | INTRAVENOUS | Status: AC | PRN
  Administered 2023-03-28: 500 [IU]

## 2023-03-28 MED ORDER — SODIUM CHLORIDE 0.9 % IV SOLN: Freq: Once | INTRAVENOUS | Status: AC

## 2023-03-28 MED ORDER — SODIUM CHLORIDE 0.9% FLUSH
10.0000 mL | INTRAVENOUS | Status: DC | PRN
  Administered 2023-03-28: 10 mL

## 2023-03-28 NOTE — Progress Notes (Signed)
[name] [phone]   [phone]  OFFICE PROGRESS NOTE  [name], DO [address] [place] [address]  DIAGNOSIS:  1) Extensive stage small cell lung cancer (T3, N3, M1 C) . She presented with large right lower lobe lung masses in addition to bulky right hilar and mediastinal lymphadenopathy as well as suspicious lytic bone lesion at L1. She was diagnosed in March 2024.  2) HIV diagnosed in March 2024   PRIOR THERAPY: None   CURRENT THERAPY: Systemic chemotherapy with carboplatin for AUC of 4 on day 1 and etoposide 80 Mg/M2 on days 1, 2 and 3 with Imfinzi 1500 Mg on day 1 every 3 weeks with Neulasta support on day 5. First dose September 14, 2022 . She is on dose reduced chemotherapy due to her HIV. Status post 8 cycles.  Starting from cycle #5 her treatment with with maintenance immunotherapy with Imfinzi 1500 Mg IV every 4 weeks  INTERVAL HISTORY: [name] 67 y.o. female returns to the clinic today for follow-up visit. Discussed the use of AI scribe software for clinical note transcription with the patient, who gave verbal consent to proceed.  History of Present Illness   [name], a 67 year old African American individual, was diagnosed with extensive stage small cell lung cancer and HIV in March 2024. She underwent four cycles of chemotherapy and immunotherapy, including carboplatin, etoposide, and Imfinzi. Following this, she continued on Imfinzi, receiving 1500 mg IV every four weeks, for a total of eight cycles to date.  In the past four weeks, she reports no new complaints or symptoms. Her appetite has returned, and her weight has remained stable. She denies any chest pain, breathing issues, nausea, vomiting, or diarrhea. She also reports no headaches or changes in vision.  She is under the care of [name] infectious disease specialist, Dr. Luciana Axe, for her HIV management. She is on medication for HIV, specifically Dovato, and her  viral load was reported as undetectable at her last visit.  She has tolerated the treatment well and is scheduled for her ninth cycle of Imfinzi.       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  MEDICATIONS:  Current Outpatient Medications  Medication Sig Dispense Refill   Aspirin 325 MG CAPS Take 325 mg by mouth daily.     baclofen (LIORESAL) 10 MG tablet Take 1 tablet (10 mg total) by mouth 3 (three) times daily as needed for muscle spasms. 30 each 0   cholecalciferol (VITAMIN D3) 25 MCG (1000 UNIT) tablet Take 1,000 Units by mouth daily.     diphenoxylate-atropine (LOMOTIL) 2.5-0.025 MG tablet Take 1 tablet by mouth 4 (four) times daily as needed for diarrhea or loose stools. 30 tablet 0   dolutegravir-lamiVUDine (DOVATO) 50-300 MG tablet Take 1 tablet by mouth daily. 30 tablet 11   folic acid (FOLVITE) 1 MG tablet Take 1 tablet (1 mg total) by mouth daily. 90 tablet 0   guaifenesin (ROBITUSSIN) 100 MG/5ML syrup Take 200 mg by mouth 3 (three) times daily as needed for cough.     lidocaine-prilocaine (EMLA) cream Apply 1 Application topically as needed. 30 g 2   nicotine (NICODERM CQ)  7 mg/24hr patch Place 1 patch (7 mg total) onto the skin daily. 28 patch 0   nicotine polacrilex (NICORETTE) 4 MG gum Take 1 each (4 mg total) by mouth as needed for smoking cessation. 30 tablet 2   oxyCODONE-acetaminophen (PERCOCET/ROXICET) 5-325 MG tablet Take 1 tablet by mouth every 8 (eight) hours as needed for severe pain. 30 tablet 0   prochlorperazine (COMPAZINE) 10 MG tablet Take 1 tablet (10 mg total) by mouth every 6 (six) hours as needed. 30 tablet 2   thiamine (VITAMIN B-1) 100 MG tablet Take 1 tablet (100 mg total) by mouth daily. 90 tablet 0    No current facility-administered medications for this visit.    SURGICAL HISTORY:  Past Surgical History:  Procedure Laterality Date   BRONCHIAL NEEDLE ASPIRATION BIOPSY  09/02/2022   Procedure: BRONCHIAL NEEDLE ASPIRATION BIOPSIES;  Surgeon: Josephine Igo, DO;  Location: [facility];  Service: Cardiopulmonary;;   IR IMAGING GUIDED PORT INSERTION  09/29/2022   MULTIPLE TOOTH EXTRACTIONS     ORIF ANKLE FRACTURE  05/03/2012   Procedure: OPEN REDUCTION INTERNAL FIXATION (ORIF) ANKLE FRACTURE;  Surgeon: Toni Arthurs, MD;  Location: [facility];  Service: Orthopedics;  Laterality: Left;   VIDEO BRONCHOSCOPY WITH ENDOBRONCHIAL ULTRASOUND Bilateral 09/02/2022   Procedure: VIDEO BRONCHOSCOPY WITH ENDOBRONCHIAL ULTRASOUND;  Surgeon: Josephine Igo, DO;  Location: [facility];  Service: Cardiopulmonary;  Laterality: Bilateral;    REVIEW OF SYSTEMS:  A comprehensive review of systems was negative.   PHYSICAL EXAMINATION: General appearance: alert, cooperative, and no distress Head: Normocephalic, without obvious abnormality, atraumatic Neck: no adenopathy, no JVD, supple, symmetrical, trachea midline, and thyroid not enlarged, symmetric, no tenderness/mass/nodules Lymph nodes: Cervical, supraclavicular, and axillary nodes normal. Resp: clear to auscultation bilaterally Back: symmetric, no curvature. ROM normal. No CVA tenderness. Cardio: regular rate and rhythm, S1, S2 normal, no murmur, click, rub or gallop GI: soft, non-tender; bowel sounds normal; no masses,  no organomegaly Extremities: extremities normal, atraumatic, no cyanosis or edema  ECOG PERFORMANCE STATUS: 0 - Asymptomatic  Blood pressure 121/73, pulse 94, temperature 97.6 F (36.4 C), temperature source Oral, resp. rate 17, height 5\' 9"  (1.753 m), weight 135 lb 1.6 oz (61.3 kg), SpO2 100%.  LABORATORY DATA: Lab Results  Component Value Date   WBC 4.1 02/28/2023   HGB 11.9 (L) 02/28/2023   HCT 37.7 02/28/2023   MCV 99.7  02/28/2023   PLT 242 02/28/2023      Chemistry      Component Value Date/Time   NA 138 02/28/2023 0833   K 3.6 02/28/2023 0833   CL 109 02/28/2023 0833   CO2 25 02/28/2023 0833   BUN 13 02/28/2023 0833   CREATININE 0.63 02/28/2023 0833   CREATININE 0.53 09/21/2022 0942      Component Value Date/Time   CALCIUM 9.2 02/28/2023 0833   ALKPHOS 61 02/28/2023 0833   AST 13 (L) 02/28/2023 0833   ALT 8 02/28/2023 0833   BILITOT 0.3 02/28/2023 0833       RADIOGRAPHIC STUDIES: No results found.  ASSESSMENT AND PLAN: This is a very pleasant 67 years old African-American female with Extensive stage small cell lung cancer (T3, N3, M1 C) . She presented with large right lower lobe lung masses in addition to bulky right hilar and mediastinal lymphadenopathy as well as suspicious lytic bone lesion at L1. She was diagnosed in March 2024.  The patient also has HIV diagnosed in March 2024 She is currently undergoing systemic chemotherapy with carboplatin for  AUC of 4 on day 1 and etoposide 80 Mg/M2 on days 1, 2 and 3 with Imfinzi 1500 Mg on day 1 every 3 weeks with Neulasta support on day 5. First dose September 14, 2022 . She is on dose reduced chemotherapy due to her HIV. Status post 8 cycles.  Starting cycle #5 her treatment is with maintenance immunotherapy with Imfinzi 1500 Mg IV every 4 weeks.  She has been tolerating her treatment fairly well. Extensive Stage Small Cell Lung Cancer Stable on Imfinzi 1500mg  IV every four weeks. No new complaints or symptoms. -Continue Imfinzi 1500mg  IV every four weeks. -Order CT chest, abdomen, and pelvis in three weeks to assess disease status.  HIV Viral load undetectable on Dovato. Followed by Dr. Luciana Axe from Infectious Disease. -Continue current HIV management with Novato as per Infectious Disease recommendations.  General Health Maintenance Stable weight and appetite. No new complaints or symptoms. -Continue current management and follow-up in four weeks.   The patient was advised to call immediately if she has any other concerning symptoms in the interval.   The patient voices understanding of current disease status and treatment options and is in agreement with the current care plan.  All questions were answered. The patient knows to call the clinic with any problems, questions or concerns. We can certainly see the patient much sooner if necessary.  The total time spent in the appointment was 20 minutes.  Disclaimer: This note was dictated with voice recognition software. Similar sounding words can inadvertently be transcribed and may not be corrected upon review.

## 2023-04-03 ENCOUNTER — Telehealth: Payer: Self-pay | Admitting: Internal Medicine

## 2023-04-03 NOTE — Telephone Encounter (Signed)
Called patients regarding November/December appointments, spoke with patient's niece regarding appointments.

## 2023-04-13 ENCOUNTER — Other Ambulatory Visit (HOSPITAL_COMMUNITY): Payer: Self-pay

## 2023-04-15 ENCOUNTER — Other Ambulatory Visit (HOSPITAL_COMMUNITY): Payer: Self-pay

## 2023-04-17 ENCOUNTER — Ambulatory Visit (HOSPITAL_COMMUNITY)
Admission: RE | Admit: 2023-04-17 | Discharge: 2023-04-17 | Disposition: A | Discharge status: Home / Self Care | Payer: Medicare HMO | Source: Ambulatory Visit | Attending: Internal Medicine | Admitting: Internal Medicine

## 2023-04-17 ENCOUNTER — Encounter (HOSPITAL_COMMUNITY): Payer: Self-pay

## 2023-04-17 ENCOUNTER — Other Ambulatory Visit: Payer: Self-pay | Admitting: Internal Medicine

## 2023-04-17 ENCOUNTER — Other Ambulatory Visit (HOSPITAL_COMMUNITY): Payer: Self-pay

## 2023-04-17 DIAGNOSIS — C3431 Malignant neoplasm of lower lobe, right bronchus or lung: Secondary | ICD-10-CM

## 2023-04-17 DIAGNOSIS — I700 Atherosclerosis of aorta: Secondary | ICD-10-CM | POA: Diagnosis not present

## 2023-04-17 DIAGNOSIS — C3490 Malignant neoplasm of unspecified part of unspecified bronchus or lung: Secondary | ICD-10-CM

## 2023-04-17 MED ORDER — IOHEXOL 300 MG/ML  SOLN
100.0000 mL | Freq: Once | INTRAMUSCULAR | Status: AC | PRN
  Administered 2023-04-17: 100 mL via INTRAVENOUS

## 2023-04-17 MED ORDER — HEPARIN SOD (PORK) LOCK FLUSH 100 UNIT/ML IV SOLN
500.0000 [IU] | Freq: Once | INTRAVENOUS | Status: AC
  Administered 2023-04-17: 500 [IU] via INTRAVENOUS

## 2023-04-17 MED ORDER — HEPARIN SOD (PORK) LOCK FLUSH 100 UNIT/ML IV SOLN
INTRAVENOUS | Status: AC
Start: 2023-04-17 — End: ?
  Filled 2023-04-17: qty 5

## 2023-04-18 ENCOUNTER — Other Ambulatory Visit: Payer: Self-pay

## 2023-04-19 ENCOUNTER — Other Ambulatory Visit (HOSPITAL_COMMUNITY): Payer: Self-pay

## 2023-04-20 NOTE — Progress Notes (Signed)
[facility] OFFICE PROGRESS NOTE  [name], DO [address] [place] [address]  DIAGNOSIS: 1) Extensive stage small cell lung cancer (T3, N3, M1 C) . She presented with large right lower lobe lung masses in addition to bulky right hilar and mediastinal lymphadenopathy as well as suspicious lytic bone lesion at L1. She was diagnosed in March 2024.  2) HIV diagnosed in March 2024 follows with Dr. Luciana Axe from infectious disease  PRIOR THERAPY: None  CURRENT THERAPY: Systemic chemotherapy with carboplatin for AUC of 4 on day 1 and etoposide 80 Mg/M2 on days 1, 2 and 3 with Imfinzi 1500 Mg on day 1 every 3 weeks with Neulasta support on day 5. First dose September 14, 2022 . She is on dose reduced chemotherapy due to her HIV. Status post 9 cycle. Starting from cycle #5, she started single agent maintenance immunotherapy with Imfinzi 1500 mg IV every 4 weeks.   INTERVAL HISTORY: [name] 67 y.o. female returns to the clinic today for a follow-up visit.  The patient is followed for her extensive stage small cell lung cancer.  She is currently on single agent maintenance immunotherapy with Imfinzi.  She tolerates it well without any concerning adverse side effects except for dry skin and itching. She has been taking benadryl at night which is not helping the itch. She is not using anything topical. Denies any fever, chills, or night sweats.  She reports that her appetite is picking up.  She states that she "feels good".  He is recovering from a mild cold with some nasal congestion but reports it is getting better.  She reports that she had her flu and COVID-vaccine performed recently. Denies any shortness of breath as she does not exert herself.  She was on supplemental oxygen when she was first diagnosed with lung cancer but has been off supplemental oxygen since early in her diagnosis.  Denies any chest pain.  Denies any nausea, vomiting, diarrhea, or constipation.  She reports  she needs to make an appointment with her eye doctor for visual changes and needing new glasses; however, she supposed to get some dental work performed later this month and needs to address her dental work before seeing the eye doctor. She follows closely with Dr. Barbaraann Cao for routine brain MRIs. She recently had a restaging CT scan. She is here today for evaluation and to review her scan before undergoing cycle #10.   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  MEDICATIONS:  Current Outpatient Medications  Medication Sig Dispense Refill   Aspirin 325 MG CAPS Take 325 mg by mouth daily.     baclofen (LIORESAL) 10 MG tablet Take 1 tablet (10 mg total) by mouth 3 (three) times daily as needed for muscle spasms. 30 each 0   cholecalciferol (VITAMIN D3) 25 MCG (1000 UNIT) tablet Take 1,000 Units by mouth daily.     diphenoxylate-atropine (LOMOTIL) 2.5-0.025 MG tablet Take 1 tablet by mouth 4 (four) times daily as needed for diarrhea or loose stools. 30 tablet 0   dolutegravir-lamiVUDine (DOVATO) 50-300 MG tablet Take 1 tablet by mouth daily. 30 tablet 11   folic acid (FOLVITE) 1  MG tablet Take 1 tablet (1 mg total) by mouth daily. 90 tablet 0   guaifenesin (ROBITUSSIN) 100 MG/5ML syrup Take 200 mg by mouth 3 (three) times daily as needed for cough.     lidocaine-prilocaine (EMLA) cream Apply 1 Application topically as needed. 30 g 2   nicotine (NICODERM CQ) 7 mg/24hr patch Place 1 patch (7 mg total) onto the skin daily. 28 patch 0   nicotine polacrilex (NICORETTE) 4 MG gum Take 1 each (4 mg total) by mouth as needed for smoking cessation. 30 tablet 2   oxyCODONE-acetaminophen (PERCOCET/ROXICET) 5-325 MG tablet Take 1 tablet by mouth  every 8 (eight) hours as needed for severe pain. 30 tablet 0   prochlorperazine (COMPAZINE) 10 MG tablet Take 1 tablet (10 mg total) by mouth every 6 (six) hours as needed. 30 tablet 2   thiamine (VITAMIN B-1) 100 MG tablet Take 1 tablet (100 mg total) by mouth daily. 90 tablet 0   triamcinolone cream (KENALOG) 0.1 % Apply 1 Application topically 2 (two) times daily. 453.6 g 0   No current facility-administered medications for this visit.    SURGICAL HISTORY:  Past Surgical History:  Procedure Laterality Date   BRONCHIAL NEEDLE ASPIRATION BIOPSY  09/02/2022   Procedure: BRONCHIAL NEEDLE ASPIRATION BIOPSIES;  Surgeon: Josephine Igo, DO;  Location: [facility];  Service: Cardiopulmonary;;   IR IMAGING GUIDED PORT INSERTION  09/29/2022   MULTIPLE TOOTH EXTRACTIONS     ORIF ANKLE FRACTURE  05/03/2012   Procedure: OPEN REDUCTION INTERNAL FIXATION (ORIF) ANKLE FRACTURE;  Surgeon: Toni Arthurs, MD;  Location: [facility] OR;  Service: Orthopedics;  Laterality: Left;   VIDEO BRONCHOSCOPY WITH ENDOBRONCHIAL ULTRASOUND Bilateral 09/02/2022   Procedure: VIDEO BRONCHOSCOPY WITH ENDOBRONCHIAL ULTRASOUND;  Surgeon: Josephine Igo, DO;  Location: [facility];  Service: Cardiopulmonary;  Laterality: Bilateral;    REVIEW OF SYSTEMS:   Review of Systems  Constitutional: Negative for appetite change, chills, fatigue, fever and unexpected weight change.  HENT: Positive for mild nasal congestion. Negative for mouth sores, nosebleeds, sore throat and trouble swallowing.   Eyes: Negative for eye problems and icterus.  Respiratory: Negative for cough, hemoptysis, shortness of breath and wheezing.   Cardiovascular: Negative for chest pain and leg swelling.  Gastrointestinal: Negative for abdominal pain, constipation, diarrhea, nausea and vomiting.  Genitourinary: Negative for bladder incontinence, difficulty urinating, dysuria, frequency and hematuria.   Musculoskeletal: Negative for back pain, gait problem, neck  pain and neck stiffness.  Skin: Positive for dry skin and itching.  Neurological: Negative for dizziness, extremity weakness, gait problem, headaches, light-headedness and seizures.  Hematological: Negative for adenopathy. Does not bruise/bleed easily.  Psychiatric/Behavioral: Negative for confusion, depression and sleep disturbance. The patient is not nervous/anxious.   PHYSICAL EXAMINATION:  Blood pressure (!) 133/93, pulse (!) 101, temperature (!) 97.4 F (36.3 C), temperature source Temporal, resp. rate 17, weight 135 lb 6.4 oz (61.4 kg), SpO2 96%.  ECOG PERFORMANCE STATUS: 1  Physical Exam  Constitutional: Oriented to person, place, and time and well-developed, well-nourished, and in no distress.  HENT:  Head: Normocephalic and atraumatic.  Mouth/Throat: Oropharynx is clear and moist. No oropharyngeal exudate.  Eyes: Conjunctivae are normal. Right eye exhibits no discharge. Left eye exhibits no discharge. No scleral icterus.  Neck: Normal range of motion. Neck supple.  Cardiovascular: Normal rate, regular rhythm, normal heart sounds and intact distal pulses.   Pulmonary/Chest: Effort normal and breath sounds normal. No respiratory distress. No wheezes. No rales.  Abdominal: Soft. Bowel sounds are  normal. Exhibits no distension and no mass. There is no tenderness.  Musculoskeletal: Normal range of motion. Exhibits no edema.  Lymphadenopathy:    No cervical adenopathy.  Neurological: Alert and oriented to person, place, and time. Exhibits normal muscle tone. Gait normal. Coordination normal.  Skin: No significant rashes noted.  Some dry skin on her upper extremities.  Not diaphoretic. No erythema. No pallor.  Psychiatric: Mood, memory and judgment normal.  Vitals reviewed.   LABORATORY DATA: Lab Results  Component Value Date   WBC 7.1 04/25/2023   HGB 12.6 04/25/2023   HCT 38.3 04/25/2023   MCV 99.0 04/25/2023   PLT 268 04/25/2023      Chemistry      Component Value  Date/Time   NA 139 04/25/2023 0848   K 3.7 04/25/2023 0848   CL 108 04/25/2023 0848   CO2 27 04/25/2023 0848   BUN 11 04/25/2023 0848   CREATININE 0.78 04/25/2023 0848   CREATININE 0.53 09/21/2022 0942      Component Value Date/Time   CALCIUM 9.8 04/25/2023 0848   ALKPHOS 67 04/25/2023 0848   AST 14 (L) 04/25/2023 0848   ALT 9 04/25/2023 0848   BILITOT 0.5 04/25/2023 0848       RADIOGRAPHIC STUDIES:  CT CHEST ABDOMEN PELVIS W CONTRAST  Result Date: 04/24/2023 CLINICAL DATA:  Staging small-cell lung cancer. Chemotherapy. History of HIV. Previous adenopathy * Tracking Code: BO * EXAM: CT CHEST, ABDOMEN, AND PELVIS WITH CONTRAST TECHNIQUE: Multidetector CT imaging of the chest, abdomen and pelvis was performed following the standard protocol during bolus administration of intravenous contrast. RADIATION DOSE REDUCTION: This exam was performed according to the departmental dose-optimization program which includes automated exposure control, adjustment of the mA and/or kV according to patient size and/or use of iterative reconstruction technique. CONTRAST:  OMNIPAQUE IOHEXOL 300 MG/ML  SOLN COMPARISON:  CT 01/19/2023 and older. FINDINGS: CT CHEST FINDINGS Cardiovascular: Heart is nonenlarged. Trace pericardial fluid. Coronary artery calcifications are seen. The thoracic aorta has a normal course and caliber with scattered vascular calcifications. These are partially calcified. There is significant plaque extending along the origin of the brachiocephalic artery with a 50% diameter stenosis, unchanged from previous. There is a left upper chest port in place which is accessed. Tip of the catheter extends to the right atrium. Mediastinum/Nodes: Preserved thyroid gland. Normal caliber thoracic esophagus. No specific abnormal lymph node enlargement identified in the axillary regions or left hilum. Few prominent mediastinal nodes are again seen. Some of these have calcification. The previous right  paratracheal node which measured 20 x 11 mm, today on series 2, image 23 measures 13 by 8 mm. Other nodes in the mediastinum are similar to slightly smaller. Right hilar node again seen as well. Previous short axis dimension 13 mm and today on series 2, image 30 13 mm. No new nodal enlargement identified at this time. Lungs/Pleura: There is some linear opacity lung bases likely scar or atelectasis. No pleural effusion. Scattered centrilobular emphysematous lung changes identified. Previous anterior right upper lobe nodule superiorly which measured 8 mm, today on series 4, image 40 measures 6 mm. No new lung nodules identified. Musculoskeletal: Osteopenia. Scattered degenerative changes. Old left rib deformities. CT ABDOMEN PELVIS FINDINGS Hepatobiliary: Stable small hepatic benign cystic lesions. No new space-occupying liver lesions. Patent portal vein. Gallbladder is nondilated. Pancreas: Unremarkable. No pancreatic ductal dilatation or surrounding inflammatory changes. Spleen: Normal in size without focal abnormality. Adrenals/Urinary Tract: Adrenal glands are unremarkable. Kidneys are normal, without renal  calculi, focal lesion, or hydronephrosis. Bladder is unremarkable. Stomach/Bowel: Stomach is within normal limits. Appendix appears normal. No evidence of bowel wall thickening, distention, or inflammatory changes. Prominent colonic stool. Vascular/Lymphatic: Aortic atherosclerosis. No enlarged abdominal or pelvic lymph nodes. Reproductive: Uterus and bilateral adnexa are unremarkable. Other: No abdominal wall hernia or abnormality. No abdominopelvic ascites. Musculoskeletal: Curvature of the spine. Degenerative changes. Bridging osteophytes along the right sacroiliac joint. IMPRESSION: Overall no significant interval change. Stable prominent lymph nodes in the mediastinum and right hilum. Right apical small nodule is stable. The nodule seen elsewhere along the course of the major fissure is not seen today. No  new mass lesion, fluid collection or lymph node enlargement. Electronically Signed   By: Karen Kays M.D.   On: 04/24/2023 18:19     ASSESSMENT/PLAN:  This is a very pleasant 67 year old female with extensive stage small cell lung cancer (T3, N3, M1 C) . She presented with large right lower lobe lung masses in addition to bulky right hilar and mediastinal lymphadenopathy as well as suspicious lytic bone lesion at L1. She was diagnosed in March 2024.    She is currently on systemic chemotherapy with carboplatin for AUC of 4 on day 1 and etoposide 80 Mg/M2 on days 1, 2 and 3 with Neulasta support on day 5 as well as Imfinzi 1500 Mg IV on day 1 every 3 weeks. She had reduced dose of carboplatin and etoposide side because of her HIV status.  Starting with cycle #5, she started single agent maintenance immunotherapy with Imfinzi 1500 mg IV every 4 weeks she is status post 9 cycles and tolerated it well.   The patient was seen with Dr. Arbutus Ped today.  Dr. Arbutus Ped personally and independently reviewed the scan and discussed results with the patient today.  The scan showed no evidence of disease progression.  Dr. Arbutus Ped recommends she continue on the same treatment at the same dose  He was reviewed. Recommend that she proceed with cycle number 10 today as scheduled.   Will see her back for follow-up visit in 4 weeks for evaluation repeat blood work before undergoing cycle #11.   We will continue taking Percocet for cancer related pain.    I sent her prescription for Kenalog cream to use on localized area of itching is interfering with her sleep.  The patient was advised to call immediately if she has any concerning symptoms in the interval. The patient voices understanding of current disease status and treatment options and is in agreement with the current care plan. All questions were answered. The patient knows to call the clinic with any problems, questions or concerns. We can certainly see the  patient much sooner if necessary    No orders of the defined types were placed in this encounter.   Laurice Kimmons L Zaylee Cornia, PA-C 04/25/23  ADDENDUM: Hematology/Oncology Attending: I had a face-to-face encounter with the patient today.  I reviewed her records, lab, scan and recommended her care plan.  This is a very pleasant 67 years old African-American female with extensive stage small cell lung cancer as well as history of HIV diagnosed in March 2024.  The patient started systemic chemotherapy with carboplatin, etoposide and Imfinzi for 4 cycles and starting from cycle #5 she is on maintenance treatment with Imfinzi 1500 Mg IV every 4 weeks status post 5 more cycles.  She has been tolerating her treatment fairly well with no concerning adverse effects. The patient had repeat CT scan of the chest, abdomen and  pelvis performed recently.  I personally and independently reviewed the scan and discussed the result with the patient today. Her scan showed no concerning findings for disease recurrence or metastasis. I recommended for her to continue her current maintenance treatment with Imfinzi and she will proceed with cycle #10 today. The patient was advised to call immediately if she has any other concerning symptoms in the interval. The total time spent in the appointment was 30 minutes. Disclaimer: This note was dictated with voice recognition software. Similar sounding words can inadvertently be transcribed and may be missed upon review. Lajuana Matte, MD

## 2023-04-25 ENCOUNTER — Other Ambulatory Visit: Payer: Self-pay

## 2023-04-25 ENCOUNTER — Other Ambulatory Visit (HOSPITAL_COMMUNITY): Payer: Self-pay

## 2023-04-25 ENCOUNTER — Inpatient Hospital Stay: Payer: Medicare HMO | Attending: Internal Medicine

## 2023-04-25 ENCOUNTER — Inpatient Hospital Stay: Payer: Medicare HMO

## 2023-04-25 ENCOUNTER — Encounter: Payer: Self-pay | Admitting: Physician Assistant

## 2023-04-25 ENCOUNTER — Encounter: Payer: Self-pay | Admitting: Internal Medicine

## 2023-04-25 ENCOUNTER — Inpatient Hospital Stay (HOSPITAL_BASED_OUTPATIENT_CLINIC_OR_DEPARTMENT_OTHER): Payer: Medicare HMO | Attending: Internal Medicine | Admitting: Physician Assistant

## 2023-04-25 VITALS — BP 133/93 | HR 101 | Temp 97.4°F | Resp 17 | Wt 135.4 lb

## 2023-04-25 VITALS — BP 119/75 | HR 89 | Temp 98.7°F | Resp 20

## 2023-04-25 DIAGNOSIS — R590 Localized enlarged lymph nodes: Secondary | ICD-10-CM | POA: Diagnosis not present

## 2023-04-25 DIAGNOSIS — L299 Pruritus, unspecified: Secondary | ICD-10-CM

## 2023-04-25 DIAGNOSIS — Z5112 Encounter for antineoplastic immunotherapy: Secondary | ICD-10-CM | POA: Insufficient documentation

## 2023-04-25 DIAGNOSIS — C3490 Malignant neoplasm of unspecified part of unspecified bronchus or lung: Secondary | ICD-10-CM | POA: Diagnosis not present

## 2023-04-25 DIAGNOSIS — G893 Neoplasm related pain (acute) (chronic): Secondary | ICD-10-CM | POA: Diagnosis not present

## 2023-04-25 DIAGNOSIS — C3431 Malignant neoplasm of lower lobe, right bronchus or lung: Secondary | ICD-10-CM

## 2023-04-25 DIAGNOSIS — Z79620 Long term (current) use of immunosuppressive biologic: Secondary | ICD-10-CM | POA: Diagnosis not present

## 2023-04-25 DIAGNOSIS — B20 Human immunodeficiency virus [HIV] disease: Secondary | ICD-10-CM | POA: Diagnosis not present

## 2023-04-25 DIAGNOSIS — M8580 Other specified disorders of bone density and structure, unspecified site: Secondary | ICD-10-CM | POA: Diagnosis not present

## 2023-04-25 DIAGNOSIS — R6889 Other general symptoms and signs: Secondary | ICD-10-CM | POA: Diagnosis not present

## 2023-04-25 LAB — CBC WITH DIFFERENTIAL (CANCER CENTER ONLY)
Abs Immature Granulocytes: 0.02 10*3/uL (ref 0.00–0.07)
Basophils Absolute: 0 10*3/uL (ref 0.0–0.1)
Basophils Relative: 0 %
Eosinophils Absolute: 0.3 10*3/uL (ref 0.0–0.5)
Eosinophils Relative: 4 %
HCT: 38.3 % (ref 36.0–46.0)
Hemoglobin: 12.6 g/dL (ref 12.0–15.0)
Immature Granulocytes: 0 %
Lymphocytes Relative: 20 %
Lymphs Abs: 1.4 10*3/uL (ref 0.7–4.0)
MCH: 32.6 pg (ref 26.0–34.0)
MCHC: 32.9 g/dL (ref 30.0–36.0)
MCV: 99 fL (ref 80.0–100.0)
Monocytes Absolute: 0.4 10*3/uL (ref 0.1–1.0)
Monocytes Relative: 5 %
Neutro Abs: 4.9 10*3/uL (ref 1.7–7.7)
Neutrophils Relative %: 71 %
Platelet Count: 268 10*3/uL (ref 150–400)
RBC: 3.87 MIL/uL (ref 3.87–5.11)
RDW: 13.4 % (ref 11.5–15.5)
WBC Count: 7.1 10*3/uL (ref 4.0–10.5)
nRBC: 0 % (ref 0.0–0.2)

## 2023-04-25 LAB — CMP (CANCER CENTER ONLY)
ALT: 9 U/L (ref 0–44)
AST: 14 U/L — ABNORMAL LOW (ref 15–41)
Albumin: 3.9 g/dL (ref 3.5–5.0)
Alkaline Phosphatase: 67 U/L (ref 38–126)
Anion gap: 4 — ABNORMAL LOW (ref 5–15)
BUN: 11 mg/dL (ref 8–23)
CO2: 27 mmol/L (ref 22–32)
Calcium: 9.8 mg/dL (ref 8.9–10.3)
Chloride: 108 mmol/L (ref 98–111)
Creatinine: 0.78 mg/dL (ref 0.44–1.00)
GFR, Estimated: >60 mL/min (ref >60)
Glucose, Bld: 119 mg/dL — ABNORMAL HIGH (ref 70–99)
Potassium: 3.7 mmol/L (ref 3.5–5.1)
Sodium: 139 mmol/L (ref 135–145)
Total Bilirubin: 0.5 mg/dL (ref <1.2)
Total Protein: 8.9 g/dL — ABNORMAL HIGH (ref 6.5–8.1)

## 2023-04-25 LAB — TSH: TSH: 0.802 u[IU]/mL (ref 0.350–4.500)

## 2023-04-25 MED ORDER — SODIUM CHLORIDE 0.9% FLUSH
10.0000 mL | INTRAVENOUS | Status: DC | PRN
  Administered 2023-04-25: 10 mL

## 2023-04-25 MED ORDER — TRIAMCINOLONE ACETONIDE 0.1 % EX CREA
1.0000 | TOPICAL_CREAM | Freq: Two times a day (BID) | CUTANEOUS | 0 refills | Status: DC
  Filled 2023-04-25: qty 454, 90d supply, fill #0

## 2023-04-25 MED ORDER — SODIUM CHLORIDE 0.9 % IV SOLN: Freq: Once | INTRAVENOUS | Status: AC

## 2023-04-25 MED ORDER — DURVALUMAB 500 MG/10ML IV SOLN
1500.0000 mg | Freq: Once | INTRAVENOUS | Status: AC
  Administered 2023-04-25: 1500 mg via INTRAVENOUS
  Filled 2023-04-25: qty 30

## 2023-04-25 MED ORDER — HEPARIN SOD (PORK) LOCK FLUSH 100 UNIT/ML IV SOLN
500.0000 [IU] | Freq: Once | INTRAVENOUS | Status: AC | PRN
  Administered 2023-04-25: 500 [IU]

## 2023-04-25 NOTE — Patient Instructions (Signed)
 [facility] - A DEPT OF [facility]  Discharge Instructions: Thank you for choosing [facility] to provide your oncology and hematology care.   If you have a lab appointment with the Cancer Center, please go directly to the Cancer Center and check in at the registration area.   Wear comfortable clothing and clothing appropriate for easy access to any Portacath or PICC line.   We strive to give you quality time with your provider. You may need to reschedule your appointment if you arrive late (15 or more minutes).  Arriving late affects you and other patients whose appointments are after yours.  Also, if you miss three or more appointments without notifying the office, you may be dismissed from the clinic at the provider's discretion.      For prescription refill requests, have your pharmacy contact our office and allow 72 hours for refills to be completed.    Today you received the following chemotherapy and/or immunotherapy agents: Imfinzi.       To help prevent nausea and vomiting after your treatment, we encourage you to take your nausea medication as directed.  BELOW ARE SYMPTOMS THAT SHOULD BE REPORTED IMMEDIATELY: *FEVER GREATER THAN 100.4 F (38 C) OR HIGHER *CHILLS OR SWEATING *NAUSEA AND VOMITING THAT IS NOT CONTROLLED WITH YOUR NAUSEA MEDICATION *UNUSUAL SHORTNESS OF BREATH *UNUSUAL BRUISING OR BLEEDING *URINARY PROBLEMS (pain or burning when urinating, or frequent urination) *BOWEL PROBLEMS (unusual diarrhea, constipation, pain near the anus) TENDERNESS IN MOUTH AND THROAT WITH OR WITHOUT PRESENCE OF ULCERS (sore throat, sores in mouth, or a toothache) UNUSUAL RASH, SWELLING OR PAIN  UNUSUAL VAGINAL DISCHARGE OR ITCHING   Items with * indicate a potential emergency and should be followed up as soon as possible or go to the Emergency Department if any problems should occur.  Please show the CHEMOTHERAPY ALERT CARD or IMMUNOTHERAPY  ALERT CARD at check-in to the Emergency Department and triage nurse.  Should you have questions after your visit or need to cancel or reschedule your appointment, please contact [facility] - A DEPT OF [facility]  Dept: [phone]  and follow the prompts.  Office hours are 8:00 a.m. to 4:30 p.m. Monday - Friday. Please note that voicemails left after 4:00 p.m. may not be returned until the following business day.  We are closed weekends and major holidays. You have access to a nurse at all times for urgent questions. Please call the main number to the clinic Dept: [phone] and follow the prompts.   For any non-urgent questions, you may also contact your provider using MyChart. We now offer e-Visits for anyone 43 and older to request care online for non-urgent symptoms. For details visit [email or web address].   Also download the MyChart app! Go to the app store, search "MyChart", open the app, select [facility], and log in with your MyChart username and password.

## 2023-04-27 ENCOUNTER — Telehealth: Payer: Self-pay

## 2023-04-27 NOTE — Telephone Encounter (Signed)
Pt LVM in regards to a cold. Spoke with pt and she reports a runny nose and cough.  Denies fever. Pt stated that Robitussin does not work for her cough, takes Claritin everyday and Dayquil and Nyquil has worked in the past for colds. Informed her to try the dayquil and nyquil and if over the weekend she doesn't feel better to give our office a call. Pt verbalized understanding.

## 2023-05-01 DIAGNOSIS — R6889 Other general symptoms and signs: Secondary | ICD-10-CM | POA: Diagnosis not present

## 2023-05-02 ENCOUNTER — Other Ambulatory Visit: Payer: Self-pay

## 2023-05-03 ENCOUNTER — Other Ambulatory Visit: Payer: Self-pay

## 2023-05-03 NOTE — Progress Notes (Signed)
Specialty Pharmacy Ongoing Clinical Assessment Note  [name] is a 67 y.o. female who is being followed by the specialty pharmacy service for RxSp HIV   Patient's specialty medication(s) reviewed today: Dolutegravir-Lamivudine   Missed doses in the last 4 weeks: 0   Patient/Caregiver did not have any additional questions or concerns.   Therapeutic benefit summary: Patient is achieving benefit   Adverse events/side effects summary: No adverse events/side effects   Patient's therapy is appropriate to: Continue    Goals Addressed             This Visit's Progress    Achieve Undetectable HIV Viral Load < 20       Patient is on track. Patient will maintain adherence. Viral load remains undetectable since 11/28/22.          Follow up:  3 months  Otto Herb Specialty Pharmacist

## 2023-05-03 NOTE — Progress Notes (Signed)
Specialty Pharmacy Refill Coordination Note  [name] is a 67 y.o. female contacted today regarding refills of specialty medication(s) Dolutegravir-Lamivudine   Patient requested Delivery   Delivery date: 05/10/23   Verified address: [address], [address]  [place]   Medication will be filled on 05/09/23.

## 2023-05-04 ENCOUNTER — Telehealth: Payer: Self-pay | Admitting: Medical Oncology

## 2023-05-04 ENCOUNTER — Other Ambulatory Visit: Payer: Self-pay | Admitting: Physician Assistant

## 2023-05-04 DIAGNOSIS — J9611 Chronic respiratory failure with hypoxia: Secondary | ICD-10-CM

## 2023-05-04 DIAGNOSIS — C3431 Malignant neoplasm of lower lobe, right bronchus or lung: Secondary | ICD-10-CM

## 2023-05-04 NOTE — Telephone Encounter (Signed)
Cough /cold symptoms x 2 weeks. "Robitussin is not working, it just makes me cough up phlegm".   Today her cough sounds wet and she said her throat hurts. Denies fever.  11/11-CT impression-"Overall no significant interval change." 11/12 -Imfinzi.

## 2023-05-04 NOTE — Telephone Encounter (Signed)
Her medicaid transportation requires 3 day notice for her appt tomorrow. Other arrangements have been made .  Debra notified of the following message:  An exception has been made by [place] for the cancer center to pay for a [employer]icab to transport  her to her appts tomorrow.  " This patient is no longer receiving transportation from [place] because after cursing me and a few of our colleagues out numerous times this year, a couple of weeks ago she exceeded the limitations of [place] being able to extend anymore Delorise Shiner and exceptions for her.  But, I will make an exception for her this time since you asked and this is such short notice, but please do not make it a habit of asking [place] to assist her with transportation moving forward.   Please call her and advise that she is getting an exception this time, and that she will be picked up around 12:15pm tomorrow by [employer]."

## 2023-05-04 NOTE — Telephone Encounter (Signed)
LVM -per Cassie I instructed pt to do a home COVID test and if negative then come in to [facility]?  Pt said she cannot afford a covid test and has no one to take her.to drug store.

## 2023-05-05 ENCOUNTER — Ambulatory Visit (HOSPITAL_COMMUNITY)
Admission: RE | Admit: 2023-05-05 | Discharge: 2023-05-05 | Disposition: A | Discharge status: Home / Self Care | Payer: Medicare HMO | Source: Ambulatory Visit | Attending: Physician Assistant | Admitting: Physician Assistant

## 2023-05-05 ENCOUNTER — Other Ambulatory Visit: Payer: Self-pay

## 2023-05-05 ENCOUNTER — Inpatient Hospital Stay: Payer: Medicare HMO

## 2023-05-05 ENCOUNTER — Encounter: Payer: Self-pay | Admitting: Internal Medicine

## 2023-05-05 ENCOUNTER — Encounter: Payer: Self-pay | Admitting: Pharmacist

## 2023-05-05 ENCOUNTER — Encounter: Payer: Self-pay | Admitting: Physician Assistant

## 2023-05-05 ENCOUNTER — Other Ambulatory Visit (HOSPITAL_COMMUNITY): Payer: Self-pay

## 2023-05-05 ENCOUNTER — Inpatient Hospital Stay (HOSPITAL_BASED_OUTPATIENT_CLINIC_OR_DEPARTMENT_OTHER): Payer: Medicare HMO | Admitting: Physician Assistant

## 2023-05-05 VITALS — BP 120/66 | HR 94 | Temp 99.1°F | Resp 17 | Wt 139.2 lb

## 2023-05-05 DIAGNOSIS — R0602 Shortness of breath: Secondary | ICD-10-CM | POA: Diagnosis not present

## 2023-05-05 DIAGNOSIS — M8580 Other specified disorders of bone density and structure, unspecified site: Secondary | ICD-10-CM | POA: Diagnosis not present

## 2023-05-05 DIAGNOSIS — J9611 Chronic respiratory failure with hypoxia: Secondary | ICD-10-CM | POA: Diagnosis not present

## 2023-05-05 DIAGNOSIS — C3431 Malignant neoplasm of lower lobe, right bronchus or lung: Secondary | ICD-10-CM | POA: Insufficient documentation

## 2023-05-05 DIAGNOSIS — R590 Localized enlarged lymph nodes: Secondary | ICD-10-CM | POA: Diagnosis not present

## 2023-05-05 DIAGNOSIS — B20 Human immunodeficiency virus [HIV] disease: Secondary | ICD-10-CM | POA: Diagnosis not present

## 2023-05-05 DIAGNOSIS — R051 Acute cough: Secondary | ICD-10-CM | POA: Diagnosis not present

## 2023-05-05 DIAGNOSIS — Z79620 Long term (current) use of immunosuppressive biologic: Secondary | ICD-10-CM | POA: Diagnosis not present

## 2023-05-05 DIAGNOSIS — G893 Neoplasm related pain (acute) (chronic): Secondary | ICD-10-CM | POA: Diagnosis not present

## 2023-05-05 DIAGNOSIS — Z5112 Encounter for antineoplastic immunotherapy: Secondary | ICD-10-CM | POA: Diagnosis not present

## 2023-05-05 MED ORDER — BENZONATATE 100 MG PO CAPS
100.0000 mg | ORAL_CAPSULE | Freq: Three times a day (TID) | ORAL | 0 refills | Status: DC | PRN
  Filled 2023-05-05: qty 20, 7d supply, fill #0

## 2023-05-05 NOTE — Progress Notes (Signed)
Symptom Management Consult Note [facility]    Patient Care Team: [name], DO as PCP - General (Family Medicine)    Name / MRN / DOB: [name]  [id number]  1955/11/16   Date of visit: 05/05/2023   Chief Complaint/Reason for visit: cough   Current Therapy: Durvalumab  Last treatment:  Day 1   Cycle 10 on 04/25/23   ASSESSMENT & PLAN: Patient is a 67 y.o. female with oncologic history of extensive small cell lung cancer followed by Dr. Arbutus Ped.  I have viewed most recent oncology note and lab work.    #Extensive small cell lung cancer  - Next appointment with oncologist is 05/23/23   #Upper Respiratory Tract Infection (URTI) Persistent cold symptoms including sneezing, coughing, and runny nose for three weeks. Patient is very well appearing, afebrile, HDS. Clear lung exam. Likely viral etiology as no signs of bacterial infection. Discussed that antibiotics are not needed for viral infections and the importance of monitoring for fever as an indicator of potential bacterial infection. -Chest x-ray without acute infectious processes. I viewed image and agree with radiologist impression. -Reviewed labs from recent visit x 10 days ago. Patient had normal white count and has over the last 4 months which is reassuring. She does not want lab work today since she is finally starting to feel better. - Resume OTC vitamins - Try OTC antihistamine to Zyrtec, Allegra, or Claritin - Use Delsym for cough suppression. Prescription sent to pharmacy for [facility] to help decrease cough as well.  Strict ED precautions discussed should symptoms worsen.   Heme/Onc History: Oncology History  Small cell lung cancer (HCC)  09/06/2022 Initial Diagnosis   Small cell lung cancer (HCC)   09/14/2022 -  Chemotherapy   Patient is on Treatment Plan : LUNG SMALL CELL EXTENSIVE STAGE Durvalumab + Carboplatin D1 + Etoposide D1-3 q21d x 4 Cycles / Durvalumab q28d          Interval history-: Discussed the use of AI scribe software for clinical note transcription with the patient, who gave verbal consent to proceed.   [name] is a 67 y.o. female with oncologic history as above presenting to [facility] today with chief complaint of cough. Patient presents unaccompanied to clinic.  Patient presents with persistent cold symptoms following recent vaccinations for influenza and COVID-19 on 03/16/23. The symptoms, which began approximately three weeks post-vaccination, include sneezing, coughing, and a runny nose. The patient reports coughing up phlegm, initially green but now white, for the past couple of weeks. She denies any fever or exposure to sick contacts, as she lives alone and has limited in-person interactions due to her ongoing cancer treatment. She has been closely monitoring her temperature and has been afebrile. She tried Robitussin last week without much symptom improvement. The patient also reports a significant reduction in her smoking habit, from a pack a day to a pack every two days. She has been using nicotine patches as part of her smoking cessation efforts. The patient also mentions concerns about potential environmental factors at home, including dust from vents and possible mold exposure due to a water leak. She is scheduled for a home inspection to address these issues.     ROS  All other systems are reviewed and are negative for acute change except as noted in the HPI.    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   signed out ama-has never gone back to have worked up     Past Surgical History:  Procedure Laterality Date   BRONCHIAL NEEDLE ASPIRATION BIOPSY  09/02/2022   Procedure: BRONCHIAL NEEDLE ASPIRATION BIOPSIES;  Surgeon: Josephine Igo, DO;   Location: [facility] ENDOSCOPY;  Service: Cardiopulmonary;;   IR IMAGING GUIDED PORT INSERTION  09/29/2022   MULTIPLE TOOTH EXTRACTIONS     ORIF ANKLE FRACTURE  05/03/2012   Procedure: OPEN REDUCTION INTERNAL FIXATION (ORIF) ANKLE FRACTURE;  Surgeon: Toni Arthurs, MD;  Location: [facility] OR;  Service: Orthopedics;  Laterality: Left;   VIDEO BRONCHOSCOPY WITH ENDOBRONCHIAL ULTRASOUND Bilateral 09/02/2022   Procedure: VIDEO BRONCHOSCOPY WITH ENDOBRONCHIAL ULTRASOUND;  Surgeon: Josephine Igo, DO;  Location: [facility] ENDOSCOPY;  Service: Cardiopulmonary;  Laterality: Bilateral;    Social History   Socioeconomic History   Marital status: Widowed    Spouse name: Not on file   Number of children: Not on file   Years of education: Not on file   Highest education level: Not on file  Occupational History   Not on file  Tobacco Use   Smoking status: Every Day    Current packs/day: 0.25    Average packs/day: 0.3 packs/day for 30.0 years (7.5 ttl pk-yrs)    Types: Cigarettes   Smokeless tobacco: Not on file  Vaping Use   Vaping status: Never Used  Substance and Sexual Activity   Alcohol use: Not Currently    Comment: 3x's / month    Drug use: Not Currently    Types: Cocaine, "Crack" cocaine    Comment: reports she quit smoking crack cocaine around 2020   Sexual activity: Not Currently  Other Topics Concern   Not on file  Social History Narrative   Not on file   Social Determinants of Health   Financial Resource Strain: Not on file  Food Insecurity: No Food Insecurity (09/02/2022)   Hunger Vital Sign    Worried About Running Out of Food in the Last Year: Never true    Ran Out of Food in the Last Year: Never true  Transportation Needs: No Transportation Needs (12/05/2022)   PRAPARE - Administrator, Civil Service (Medical): No    Lack of Transportation (Non-Medical): No  Physical Activity: Not on file  Stress: Not on file  Social Connections: Not on file  Intimate Partner Violence: Not At  Risk (09/02/2022)   Humiliation, Afraid, Rape, and Kick questionnaire    Fear of Current or Ex-Partner: No    Emotionally Abused: No    Physically Abused: No    Sexually Abused: No    Family History  Problem Relation Age of Onset   Hypertension Mother    Diabetes Mother    Heart disease Sister    Heart disease Sister      Current Outpatient Medications:    benzonatate (TESSALON PERLES) 100 MG capsule, Take 1 capsule (100 mg total) by mouth 3 (three) times daily as needed for cough., Disp: 20 capsule, Rfl: 0   Aspirin 325 MG CAPS, Take 325 mg by mouth daily., Disp: , Rfl:    baclofen (LIORESAL) 10 MG tablet, Take 1 tablet (10 mg total) by mouth 3 (three) times daily as needed for muscle spasms., Disp: 30 each, Rfl: 0   cholecalciferol (VITAMIN D3) 25 MCG (1000 UNIT) tablet, Take 1,000 Units by mouth daily.,  Disp: , Rfl:    diphenoxylate-atropine (LOMOTIL) 2.5-0.025 MG tablet, Take 1 tablet by mouth 4 (four) times daily as needed for diarrhea or loose stools., Disp: 30 tablet, Rfl: 0   dolutegravir-lamiVUDine (DOVATO) 50-300 MG tablet, Take 1 tablet by mouth daily., Disp: 30 tablet, Rfl: 11   folic acid (FOLVITE) 1 MG tablet, Take 1 tablet (1 mg total) by mouth daily., Disp: 90 tablet, Rfl: 0   guaifenesin (ROBITUSSIN) 100 MG/5ML syrup, Take 200 mg by mouth 3 (three) times daily as needed for cough., Disp: , Rfl:    lidocaine-prilocaine (EMLA) cream, Apply 1 Application topically as needed., Disp: 30 g, Rfl: 2   nicotine (NICODERM CQ) 7 mg/24hr patch, Place 1 patch (7 mg total) onto the skin daily., Disp: 28 patch, Rfl: 0   nicotine polacrilex (NICORETTE) 4 MG gum, Take 1 each (4 mg total) by mouth as needed for smoking cessation., Disp: 30 tablet, Rfl: 2   oxyCODONE-acetaminophen (PERCOCET/ROXICET) 5-325 MG tablet, Take 1 tablet by mouth every 8 (eight) hours as needed for severe pain., Disp: 30 tablet, Rfl: 0   prochlorperazine (COMPAZINE) 10 MG tablet, Take 1 tablet (10 mg total) by  mouth every 6 (six) hours as needed., Disp: 30 tablet, Rfl: 2   thiamine (VITAMIN B-1) 100 MG tablet, Take 1 tablet (100 mg total) by mouth daily., Disp: 90 tablet, Rfl: 0   triamcinolone cream (KENALOG) 0.1 %, Apply 1 Application topically 2 (two) times daily., Disp: 454 g, Rfl: 0  PHYSICAL EXAM: ECOG FS:1 - Symptomatic but completely ambulatory    Vitals:   05/05/23 1333  BP: 120/66  Pulse: 94  Resp: 17  Temp: 99.1 F (37.3 C)  TempSrc: Temporal  SpO2: 98%  Weight: 139 lb 4 oz (63.2 kg)   Physical Exam Vitals and nursing note reviewed.  Constitutional:      Appearance: She is not ill-appearing or toxic-appearing.  HENT:     Head: Normocephalic.     Comments: No sinus tenderness    Right Ear: External ear normal.     Left Ear: External ear normal.     Mouth/Throat:     Mouth: Mucous membranes are moist.     Comments: Minor erythema to oropharynx, no edema, no exudate, no tonsillar swelling, voice normal, neck supple without lymphadenopathy Eyes:     Conjunctiva/sclera: Conjunctivae normal.  Cardiovascular:     Rate and Rhythm: Normal rate and regular rhythm.     Pulses: Normal pulses.     Heart sounds: Normal heart sounds.  Pulmonary:     Effort: Pulmonary effort is normal. No respiratory distress.     Breath sounds: Normal breath sounds. No stridor. No wheezing, rhonchi or rales.  Chest:     Chest wall: No tenderness.  Abdominal:     General: There is no distension.  Musculoskeletal:     Cervical back: Normal range of motion.  Skin:    General: Skin is warm and dry.  Neurological:     Mental Status: She is alert.        LABORATORY DATA: I have reviewed the data as listed    Latest Ref Rng & Units 04/25/2023    8:48 AM 03/28/2023   10:05 AM 02/28/2023    8:33 AM  CBC  WBC 4.0 - 10.5 K/uL 7.1  4.8  4.1   Hemoglobin 12.0 - 15.0 g/dL 40.9  81.1  91.4   Hematocrit 36.0 - 46.0 % 38.3  40.3  37.7   Platelets 150 -  400 K/uL 268  260  242         Latest  Ref Rng & Units 04/25/2023    8:48 AM 03/28/2023   10:05 AM 02/28/2023    8:33 AM  CMP  Glucose 70 - 99 mg/dL 161  096  94   BUN 8 - 23 mg/dL 11  12  13    Creatinine 0.44 - 1.00 mg/dL 0.45  4.09  8.11   Sodium 135 - 145 mmol/L 139  139  138   Potassium 3.5 - 5.1 mmol/L 3.7  3.8  3.6   Chloride 98 - 111 mmol/L 108  107  109   CO2 22 - 32 mmol/L 27  27  25    Calcium 8.9 - 10.3 mg/dL 9.8  91.4  9.2   Total Protein 6.5 - 8.1 g/dL 8.9  8.9  8.5   Total Bilirubin <1.2 mg/dL 0.5  0.5  0.3   Alkaline Phos 38 - 126 U/L 67  67  61   AST 15 - 41 U/L 14  17  13    ALT 0 - 44 U/L 9  10  8         RADIOGRAPHIC STUDIES (from last 24 hours if applicable) I have personally reviewed the radiological images as listed and agreed with the findings in the report. DG Chest 2 View  Result Date: 05/05/2023 CLINICAL DATA:  Shortness of breath. History of right-sided lung cancer. EXAM: CHEST - 2 VIEW COMPARISON:  September 01, 2022. FINDINGS: The heart size and mediastinal contours are within normal limits. Left internal jugular Port-A-Cath is noted. No acute pulmonary abnormality is noted. Large right lung opacity noted on prior exam is no longer visualized. The visualized skeletal structures are unremarkable. IMPRESSION: No acute pulmonary abnormality seen at this time. Electronically Signed   By: Lupita Raider M.D.   On: 05/05/2023 13:15        Visit Diagnosis: 1. 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 Acute cough      No orders of the defined types were placed in this encounter.   All questions were answered. The patient knows to call the clinic with any problems, questions or concerns. No barriers to learning was detected.  A total of more than 30 minutes were spent on this encounter with face-to-face time and non-face-to-face time, including preparing to see the patient, ordering tests and/or medications, counseling the patient and coordination of care as outlined above.     Thank you for allowing me to participate in the care of this patient.    Shanon Ace, PA-C Department of Hematology/Oncology [facility] at [facility] Phone: [phone]  [phone]    05/05/2023 3:07 PM

## 2023-05-05 NOTE — Progress Notes (Signed)
Called pt as a curtesy reminder about Taxi picking her up at 1215, CXR and appt with [facility] @ 1330. Pt verbalized understanding.

## 2023-05-09 ENCOUNTER — Other Ambulatory Visit: Payer: Self-pay

## 2023-05-09 ENCOUNTER — Other Ambulatory Visit (HOSPITAL_COMMUNITY): Payer: Self-pay

## 2023-05-10 ENCOUNTER — Other Ambulatory Visit: Payer: Self-pay

## 2023-05-12 ENCOUNTER — Other Ambulatory Visit: Payer: Self-pay | Admitting: Oncology

## 2023-05-12 ENCOUNTER — Encounter: Payer: Self-pay | Admitting: Physician Assistant

## 2023-05-12 ENCOUNTER — Other Ambulatory Visit (HOSPITAL_COMMUNITY): Payer: Self-pay

## 2023-05-12 ENCOUNTER — Telehealth: Payer: Self-pay | Admitting: *Deleted

## 2023-05-12 ENCOUNTER — Encounter: Payer: Self-pay | Admitting: Internal Medicine

## 2023-05-12 DIAGNOSIS — R052 Subacute cough: Secondary | ICD-10-CM

## 2023-05-12 MED ORDER — BENZONATATE 100 MG PO CAPS
100.0000 mg | ORAL_CAPSULE | Freq: Three times a day (TID) | ORAL | 0 refills | Status: DC | PRN
  Filled 2023-05-12 – 2023-05-15 (×2): qty 20, 7d supply, fill #0

## 2023-05-12 NOTE — Telephone Encounter (Signed)
Returned patient call.  Relayed the below information from [facility] pharmacy.  Pt stated understanding and will call number listed.  Please contact [facility] at [facility] [phone] to provide an updated payment method. We are unable to ship your prescription until the new payment method is received. Thank you for choosing [name] for your prescription needs.

## 2023-05-15 ENCOUNTER — Encounter: Payer: Self-pay | Admitting: Internal Medicine

## 2023-05-15 ENCOUNTER — Encounter: Payer: Self-pay | Admitting: Physician Assistant

## 2023-05-15 ENCOUNTER — Other Ambulatory Visit: Payer: Self-pay

## 2023-05-15 ENCOUNTER — Other Ambulatory Visit (HOSPITAL_COMMUNITY): Payer: Self-pay

## 2023-05-16 ENCOUNTER — Other Ambulatory Visit: Payer: Self-pay

## 2023-05-17 ENCOUNTER — Other Ambulatory Visit (HOSPITAL_COMMUNITY): Payer: Self-pay

## 2023-05-18 ENCOUNTER — Telehealth: Payer: Self-pay | Admitting: *Deleted

## 2023-05-18 NOTE — Telephone Encounter (Signed)
PC to patient, informed her we need to change her phone appointment with Dr Barbaraann Cao on 05/22/23 because her MRI is on 05/23/23 & he wants to discuss results during her MD visit.  Phone visit rescheduled to 05/29/23 at 3:00 pm.  Patient verbalizes understanding.

## 2023-05-19 ENCOUNTER — Other Ambulatory Visit: Payer: Self-pay | Admitting: Physician Assistant

## 2023-05-19 ENCOUNTER — Encounter: Payer: Self-pay | Admitting: Internal Medicine

## 2023-05-19 ENCOUNTER — Encounter: Payer: Self-pay | Admitting: Physician Assistant

## 2023-05-19 ENCOUNTER — Other Ambulatory Visit (HOSPITAL_COMMUNITY): Payer: Self-pay

## 2023-05-19 ENCOUNTER — Telehealth: Payer: Self-pay

## 2023-05-19 ENCOUNTER — Other Ambulatory Visit: Payer: Self-pay

## 2023-05-19 DIAGNOSIS — R052 Subacute cough: Secondary | ICD-10-CM

## 2023-05-19 MED ORDER — GUAIFENESIN ER 600 MG PO TB12
600.0000 mg | ORAL_TABLET | Freq: Two times a day (BID) | ORAL | 0 refills | Status: DC | PRN
  Filled 2023-05-19: qty 20, 10d supply, fill #0

## 2023-05-19 MED ORDER — LORATADINE 10 MG PO TABS
10.0000 mg | ORAL_TABLET | Freq: Every day | ORAL | 0 refills | Status: DC
  Filled 2023-05-19: qty 30, 30d supply, fill #0

## 2023-05-19 MED ORDER — BENZONATATE 100 MG PO CAPS
100.0000 mg | ORAL_CAPSULE | Freq: Three times a day (TID) | ORAL | 0 refills | Status: DC | PRN
  Filled 2023-05-19: qty 20, 7d supply, fill #0

## 2023-05-19 NOTE — Telephone Encounter (Signed)
Pt called and LVM stating that she is still having cold symptoms- runny nose, cough and sneezing and wants something sent to her pharmacy to deliver to her house because of transportation issues. Per Burney, PA- sent in Hoover, Claritin and mucinex to [facility]. Pt needs to follow up with PCP or infectious disease for these symptoms.  Tried to contact patient to let her know. LVM for return call.

## 2023-05-22 ENCOUNTER — Encounter: Payer: Self-pay | Admitting: Physician Assistant

## 2023-05-22 ENCOUNTER — Encounter: Payer: Self-pay | Admitting: Internal Medicine

## 2023-05-22 ENCOUNTER — Inpatient Hospital Stay: Payer: Medicare HMO | Admitting: Internal Medicine

## 2023-05-23 ENCOUNTER — Inpatient Hospital Stay: Payer: Medicare HMO | Attending: Internal Medicine

## 2023-05-23 ENCOUNTER — Inpatient Hospital Stay (HOSPITAL_BASED_OUTPATIENT_CLINIC_OR_DEPARTMENT_OTHER): Payer: Medicare HMO | Admitting: Internal Medicine

## 2023-05-23 ENCOUNTER — Ambulatory Visit (HOSPITAL_COMMUNITY)
Admission: RE | Admit: 2023-05-23 | Discharge: 2023-05-23 | Disposition: A | Discharge status: Home / Self Care | Payer: Medicare HMO | Source: Ambulatory Visit | Attending: Internal Medicine | Admitting: Internal Medicine

## 2023-05-23 ENCOUNTER — Inpatient Hospital Stay: Payer: Medicare HMO

## 2023-05-23 VITALS — BP 131/80 | HR 91 | Temp 99.1°F | Resp 17 | Wt 138.3 lb

## 2023-05-23 DIAGNOSIS — B20 Human immunodeficiency virus [HIV] disease: Secondary | ICD-10-CM | POA: Insufficient documentation

## 2023-05-23 DIAGNOSIS — Z7982 Long term (current) use of aspirin: Secondary | ICD-10-CM | POA: Insufficient documentation

## 2023-05-23 DIAGNOSIS — C3431 Malignant neoplasm of lower lobe, right bronchus or lung: Secondary | ICD-10-CM

## 2023-05-23 DIAGNOSIS — C3490 Malignant neoplasm of unspecified part of unspecified bronchus or lung: Secondary | ICD-10-CM | POA: Diagnosis not present

## 2023-05-23 DIAGNOSIS — C3410 Malignant neoplasm of upper lobe, unspecified bronchus or lung: Secondary | ICD-10-CM

## 2023-05-23 DIAGNOSIS — M899 Disorder of bone, unspecified: Secondary | ICD-10-CM | POA: Diagnosis not present

## 2023-05-23 DIAGNOSIS — Z95828 Presence of other vascular implants and grafts: Secondary | ICD-10-CM

## 2023-05-23 DIAGNOSIS — Z5112 Encounter for antineoplastic immunotherapy: Secondary | ICD-10-CM | POA: Diagnosis not present

## 2023-05-23 DIAGNOSIS — R6889 Other general symptoms and signs: Secondary | ICD-10-CM | POA: Diagnosis not present

## 2023-05-23 DIAGNOSIS — I639 Cerebral infarction, unspecified: Secondary | ICD-10-CM | POA: Diagnosis not present

## 2023-05-23 DIAGNOSIS — R9089 Other abnormal findings on diagnostic imaging of central nervous system: Secondary | ICD-10-CM | POA: Insufficient documentation

## 2023-05-23 DIAGNOSIS — Z8673 Personal history of transient ischemic attack (TIA), and cerebral infarction without residual deficits: Secondary | ICD-10-CM | POA: Diagnosis not present

## 2023-05-23 DIAGNOSIS — I6782 Cerebral ischemia: Secondary | ICD-10-CM | POA: Diagnosis not present

## 2023-05-23 LAB — CBC WITH DIFFERENTIAL (CANCER CENTER ONLY)
Abs Immature Granulocytes: 0.01 10*3/uL (ref 0.00–0.07)
Basophils Absolute: 0 10*3/uL (ref 0.0–0.1)
Basophils Relative: 1 %
Eosinophils Absolute: 0.2 10*3/uL (ref 0.0–0.5)
Eosinophils Relative: 4 %
HCT: 36.4 % (ref 36.0–46.0)
Hemoglobin: 12.1 g/dL (ref 12.0–15.0)
Immature Granulocytes: 0 %
Lymphocytes Relative: 30 %
Lymphs Abs: 1.3 10*3/uL (ref 0.7–4.0)
MCH: 33.1 pg (ref 26.0–34.0)
MCHC: 33.2 g/dL (ref 30.0–36.0)
MCV: 99.5 fL (ref 80.0–100.0)
Monocytes Absolute: 0.3 10*3/uL (ref 0.1–1.0)
Monocytes Relative: 7 %
Neutro Abs: 2.4 10*3/uL (ref 1.7–7.7)
Neutrophils Relative %: 58 %
Platelet Count: 242 10*3/uL (ref 150–400)
RBC: 3.66 MIL/uL — ABNORMAL LOW (ref 3.87–5.11)
RDW: 13.1 % (ref 11.5–15.5)
WBC Count: 4.2 10*3/uL (ref 4.0–10.5)
nRBC: 0 % (ref 0.0–0.2)

## 2023-05-23 LAB — CMP (CANCER CENTER ONLY)
ALT: 10 U/L (ref 0–44)
AST: 14 U/L — ABNORMAL LOW (ref 15–41)
Albumin: 3.8 g/dL (ref 3.5–5.0)
Alkaline Phosphatase: 69 U/L (ref 38–126)
Anion gap: 6 (ref 5–15)
BUN: 11 mg/dL (ref 8–23)
CO2: 25 mmol/L (ref 22–32)
Calcium: 9.3 mg/dL (ref 8.9–10.3)
Chloride: 109 mmol/L (ref 98–111)
Creatinine: 0.75 mg/dL (ref 0.44–1.00)
GFR, Estimated: >60 mL/min (ref >60)
Glucose, Bld: 90 mg/dL (ref 70–99)
Potassium: 4.1 mmol/L (ref 3.5–5.1)
Sodium: 140 mmol/L (ref 135–145)
Total Bilirubin: 0.3 mg/dL (ref <1.2)
Total Protein: 8.5 g/dL — ABNORMAL HIGH (ref 6.5–8.1)

## 2023-05-23 MED ORDER — GADOBUTROL 1 MMOL/ML IV SOLN
6.0000 mL | Freq: Once | INTRAVENOUS | Status: AC | PRN
  Administered 2023-05-23: 6 mL via INTRAVENOUS

## 2023-05-23 MED ORDER — SODIUM CHLORIDE 0.9 % IV SOLN: Freq: Once | INTRAVENOUS | Status: AC

## 2023-05-23 MED ORDER — SODIUM CHLORIDE 0.9% FLUSH
10.0000 mL | INTRAVENOUS | Status: DC | PRN
  Administered 2023-05-23: 10 mL

## 2023-05-23 MED ORDER — HEPARIN SOD (PORK) LOCK FLUSH 100 UNIT/ML IV SOLN
500.0000 [IU] | Freq: Once | INTRAVENOUS | Status: AC | PRN
  Administered 2023-05-23: 500 [IU]

## 2023-05-23 MED ORDER — DURVALUMAB 500 MG/10ML IV SOLN
1500.0000 mg | Freq: Once | INTRAVENOUS | Status: AC
  Administered 2023-05-23: 1500 mg via INTRAVENOUS
  Filled 2023-05-23: qty 30

## 2023-05-23 MED ORDER — SODIUM CHLORIDE 0.9% FLUSH
10.0000 mL | Freq: Once | INTRAVENOUS | Status: AC
  Administered 2023-05-23: 10 mL

## 2023-05-23 NOTE — Progress Notes (Signed)
[facility] [phone]   [phone]  OFFICE PROGRESS NOTE  Joan Skeeters, DO [address] [place] [address]  DIAGNOSIS:  1) Extensive stage small cell lung cancer (T3, N3, M1 C) . She presented with large right lower lobe lung masses in addition to bulky right hilar and mediastinal lymphadenopathy as well as suspicious lytic bone lesion at L1. She was diagnosed in March 2024.  2) HIV diagnosed in March 2024   PRIOR THERAPY: None   CURRENT THERAPY: Systemic chemotherapy with carboplatin for AUC of 4 on day 1 and etoposide 80 Mg/M2 on days 1, 2 and 3 with Imfinzi 1500 Mg on day 1 every 3 weeks with Neulasta support on day 5. First dose September 14, 2022 . She is on dose reduced chemotherapy due to her HIV. Status post 8 cycles.  Starting from cycle #5 her treatment with with maintenance immunotherapy with Imfinzi 1500 Mg IV every 4 weeks  INTERVAL HISTORY: [name] 67 y.o. female returns to the clinic today for follow-up visit.  Discussed the use of AI scribe software for clinical note transcription with the patient, who gave verbal consent to proceed.  History of Present Illness   [name], a 68 year old patient, was diagnosed with extensive stage small cell lung cancer and HIV in March 2024. The patient underwent four cycles of chemotherapy with carboplatin, etoposide, and Imfinzi, followed by Imfinizi every four weeks starting from cycle number five.  The patient reports feeling well overall, with no specific complaints or side effects from the treatment. However, she has experienced intermittent numbness in the right hand, which she is unsure if it is related to the cancer treatment or the HIV. She has an upcoming appointment with her HIV doctor to discuss this symptom.  In addition to the cancer and HIV, the patient has a history of stroke, which was incidentally discovered during a recent MRI of the brain. The MRI was performed to  rule out brain metastasis, but instead revealed evidence of an old stroke. The patient is under the care of a neuro-oncologist for this issue.  The patient is currently taking daily aspirin, which may be helping with the stroke symptoms. She is scheduled to return in four weeks for the next round of treatment.      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  MEDICATIONS:  Current Outpatient Medications  Medication Sig Dispense Refill   Aspirin 325 MG CAPS Take 325 mg by mouth daily.     baclofen (LIORESAL) 10 MG tablet Take 1 tablet (10 mg total) by mouth 3 (three) times daily as needed for muscle spasms. 30 each 0   benzonatate (TESSALON PERLES) 100 MG capsule Take 1 capsule (100 mg total) by mouth 3 (three) times daily as needed for cough. 20 capsule 0   cholecalciferol (VITAMIN D3) 25 MCG (1000 UNIT) tablet Take 1,000 Units by mouth daily.     diphenoxylate-atropine (LOMOTIL) 2.5-0.025 MG tablet Take 1 tablet by mouth 4 (four) times daily as needed for diarrhea or loose stools. 30 tablet 0   dolutegravir-lamiVUDine (DOVATO) 50-300 MG tablet Take 1 tablet by mouth daily. 30  tablet 11   folic acid (FOLVITE) 1 MG tablet Take 1 tablet (1 mg total) by mouth daily. 90 tablet 0   guaiFENesin (MUCINEX) 600 MG 12 hr tablet Take 1 tablet (600 mg total) by mouth 2 (two) times daily as needed. 30 tablet 0   lidocaine-prilocaine (EMLA) cream Apply 1 Application topically as needed. 30 g 2   loratadine (CLARITIN) 10 MG tablet Take 1 tablet (10 mg total) by mouth daily. 30 tablet 0   nicotine (NICODERM CQ) 7 mg/24hr patch Place 1 patch (7 mg total) onto the skin daily. 28 patch 0   nicotine polacrilex (NICORETTE) 4 MG gum Take 1 each (4  mg total) by mouth as needed for smoking cessation. 30 tablet 2   oxyCODONE-acetaminophen (PERCOCET/ROXICET) 5-325 MG tablet Take 1 tablet by mouth every 8 (eight) hours as needed for severe pain. 30 tablet 0   prochlorperazine (COMPAZINE) 10 MG tablet Take 1 tablet (10 mg total) by mouth every 6 (six) hours as needed. 30 tablet 2   thiamine (VITAMIN B-1) 100 MG tablet Take 1 tablet (100 mg total) by mouth daily. 90 tablet 0   triamcinolone cream (KENALOG) 0.1 % Apply 1 Application topically 2 (two) times daily. 454 g 0   No current facility-administered medications for this visit.    SURGICAL HISTORY:  Past Surgical History:  Procedure Laterality Date   BRONCHIAL NEEDLE ASPIRATION BIOPSY  09/02/2022   Procedure: BRONCHIAL NEEDLE ASPIRATION BIOPSIES;  Surgeon: Josephine Igo, DO;  Location: [facility];  Service: Cardiopulmonary;;   IR IMAGING GUIDED PORT INSERTION  09/29/2022   MULTIPLE TOOTH EXTRACTIONS     ORIF ANKLE FRACTURE  05/03/2012   Procedure: OPEN REDUCTION INTERNAL FIXATION (ORIF) ANKLE FRACTURE;  Surgeon: Toni Arthurs, MD;  Location: [facility] OR;  Service: Orthopedics;  Laterality: Left;   VIDEO BRONCHOSCOPY WITH ENDOBRONCHIAL ULTRASOUND Bilateral 09/02/2022   Procedure: VIDEO BRONCHOSCOPY WITH ENDOBRONCHIAL ULTRASOUND;  Surgeon: Josephine Igo, DO;  Location: [facility];  Service: Cardiopulmonary;  Laterality: Bilateral;    REVIEW OF SYSTEMS:  A comprehensive review of systems was negative except for: Constitutional: positive for fatigue   PHYSICAL EXAMINATION: General appearance: alert, cooperative, and no distress Head: Normocephalic, without obvious abnormality, atraumatic Neck: no adenopathy, no JVD, supple, symmetrical, trachea midline, and thyroid not enlarged, symmetric, no tenderness/mass/nodules Lymph nodes: Cervical, supraclavicular, and axillary nodes normal. Resp: clear to auscultation bilaterally Back: symmetric, no curvature. ROM normal. No CVA  tenderness. Cardio: regular rate and rhythm, S1, S2 normal, no murmur, click, rub or gallop GI: soft, non-tender; bowel sounds normal; no masses,  no organomegaly Extremities: extremities normal, atraumatic, no cyanosis or edema  ECOG PERFORMANCE STATUS: 1 - Symptomatic but completely ambulatory  Blood pressure 131/80, pulse 91, temperature 99.1 F (37.3 C), temperature source Temporal, resp. rate 17, weight 138 lb 4.8 oz (62.7 kg), SpO2 99%.  LABORATORY DATA: Lab Results  Component Value Date   WBC 4.2 05/23/2023   HGB 12.1 05/23/2023   HCT 36.4 05/23/2023   MCV 99.5 05/23/2023   PLT 242 05/23/2023      Chemistry      Component Value Date/Time   NA 140 05/23/2023 0809   K 4.1 05/23/2023 0809   CL 109 05/23/2023 0809   CO2 25 05/23/2023 0809   BUN 11 05/23/2023 0809   CREATININE 0.75 05/23/2023 0809   CREATININE 0.53 09/21/2022 0942      Component Value Date/Time   CALCIUM 9.3 05/23/2023 0809   ALKPHOS 69 05/23/2023 0809  AST 14 (L) 05/23/2023 0809   ALT 10 05/23/2023 0809   BILITOT 0.3 05/23/2023 0809       RADIOGRAPHIC STUDIES: MR BRAIN W WO CONTRAST  Result Date: 05/23/2023 CLINICAL DATA:  Brain/CNS neoplasm, assess treatment response. History of small cell lung cancer and HIV infection. EXAM: MRI HEAD WITHOUT AND WITH CONTRAST TECHNIQUE: Multiplanar, multiecho pulse sequences of the brain and surrounding structures were obtained without and with intravenous contrast. CONTRAST:  6mL GADAVIST GADOBUTROL 1 MMOL/ML IV SOLN COMPARISON:  11/11/2022.  09/15/2022. FINDINGS: Brain: No abnormal diffusion on today's study. Resolution of any diffusion abnormality associated with the right frontal opercular infarction. Mild chronic small-vessel ischemic change again affects pons. No focal cerebellar insult. Cerebral hemispheres otherwise show old small vessel infarctions of the basal ganglia and white matter to a minimal degree. Old ischemic changes in the right parietal region  present as seen previously. Resolution of multiple small foci of cortical enhancement in the right hemisphere consistent with prior infarctions. Single newly seen focus of cortical enhancement in the right parietal region, marked with arrows, consistent with an interval subacute cortical insult. 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 Carotid evaluation recommended. Small foci of hemosiderin deposition associated with some of the previous right MCA territory insults appears similar. No evidence of acute hemorrhage. Vascular: Major vessels at the base of the brain show flow. Skull and upper cervical spine: Negative Sinuses/Orbits: Minimal seasonal mucosal inflammatory changes. No advanced sinusitis. Orbits negative. Other: None IMPRESSION: 1. Findings are consistent with strokes rather than tumor. Dominant infarction in the right frontal operculum now looks chronic without any features worrisome for neoplastic disease. 2. Other smaller cortical infarctions previously seen scattered within the right middle cerebral artery territory affecting the frontal and parietal cortical brain no longer show enhancement. There is a newly seen small subacute cortical infarction in the right parietal region since the study of May. 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 3. Low level hemosiderin deposition in many of the regions right MCA territory insult. No evidence of acute hemorrhage. Electronically Signed   By: Paulina Fusi M.D.   On: 05/23/2023 08:33   DG Chest 2 View  Result Date: 05/05/2023 CLINICAL DATA:  Shortness of breath. History of right-sided lung cancer. EXAM: CHEST - 2 VIEW COMPARISON:  September 01, 2022. FINDINGS: The heart size and mediastinal contours are within normal limits. Left internal jugular Port-A-Cath is noted. No acute  pulmonary abnormality is noted. Large right lung opacity noted on prior exam is no longer visualized. The visualized skeletal structures are unremarkable. IMPRESSION: No acute pulmonary abnormality seen at this time. Electronically Signed   By: Lupita Raider M.D.   On: 05/05/2023 13:15    ASSESSMENT AND PLAN: This is a very pleasant 67 years old African-American female with Extensive stage small cell lung cancer (T3, N3, M1 C) . She presented with large right lower lobe lung masses in addition to bulky right hilar and mediastinal lymphadenopathy as well as suspicious lytic bone lesion at L1. She was diagnosed in March 2024.  The patient also has HIV diagnosed in March 2024 She is currently undergoing systemic chemotherapy with carboplatin for AUC of 4 on day 1 and etoposide 80 Mg/M2 on days 1, 2 and 3 with Imfinzi 1500 Mg on day 1 every 3 weeks with Neulasta support on day 5. First dose September 14, 2022 . She is on dose reduced chemotherapy due to her HIV. Status post 10 cycles.  Starting cycle #5 her treatment is with maintenance immunotherapy with Imfinzi 1500 Mg IV every 4 weeks.  She has been tolerating her treatment fairly well.  Extensive Stage Small Cell Lung Cancer Diagnosed in March 2024. Undergoing chemotherapy and immunotherapy. Completed four cycles of carboplatin, etoposide, and Imfinzi, and on Imfinizi every four weeks since cycle five. Reports right hand numbness, possibly treatment or HIV-related. Lab work stable. Recent MRI showed no brain metastasis but an old stroke. Discussed with neuro-oncologist Dr. Barbaraann Cao for further evaluation. - Proceed with cycle eleven of Imfinzi today - Schedule next treatment in four weeks - Plan for next scan in February  Old Stroke Identified on recent MRI. Reports intermittent right hand numbness, possibly stroke-related. Under care of Dr. Barbaraann Cao. Continues daily aspirin. - Continue daily aspirin - Follow up with Dr. Barbaraann Cao for further evaluation and  management  HIV Diagnosed in March 2024. Under treatment. Reports right hand numbness, possibly a side effect of HIV medications. Appointment with HIV specialist next week. - Discuss numbness with HIV specialist at next appointment  Follow-up - Schedule follow-up appointment in four weeks.   The patient was advised to call immediately if she has any concerning symptoms in the interval. The patient voices understanding of current disease status and treatment options and is in agreement with the current care plan.  All questions were answered. The patient knows to call the clinic with any problems, questions or concerns. We can certainly see the patient much sooner if necessary.  The total time spent in the appointment was 20 minutes.  Disclaimer: This note was dictated with voice recognition software. Similar sounding words can inadvertently be transcribed and may not be corrected upon review.

## 2023-05-23 NOTE — Patient Instructions (Signed)
 [facility] - A DEPT OF [facility]  Discharge Instructions: Thank you for choosing [facility] to provide your oncology and hematology care.   If you have a lab appointment with the Cancer Center, please go directly to the Cancer Center and check in at the registration area.   Wear comfortable clothing and clothing appropriate for easy access to any Portacath or PICC line.   We strive to give you quality time with your provider. You may need to reschedule your appointment if you arrive late (15 or more minutes).  Arriving late affects you and other patients whose appointments are after yours.  Also, if you miss three or more appointments without notifying the office, you may be dismissed from the clinic at the provider's discretion.      For prescription refill requests, have your pharmacy contact our office and allow 72 hours for refills to be completed.    Today you received the following chemotherapy and/or immunotherapy agents: Imfinzi      To help prevent nausea and vomiting after your treatment, we encourage you to take your nausea medication as directed.  BELOW ARE SYMPTOMS THAT SHOULD BE REPORTED IMMEDIATELY: *FEVER GREATER THAN 100.4 F (38 C) OR HIGHER *CHILLS OR SWEATING *NAUSEA AND VOMITING THAT IS NOT CONTROLLED WITH YOUR NAUSEA MEDICATION *UNUSUAL SHORTNESS OF BREATH *UNUSUAL BRUISING OR BLEEDING *URINARY PROBLEMS (pain or burning when urinating, or frequent urination) *BOWEL PROBLEMS (unusual diarrhea, constipation, pain near the anus) TENDERNESS IN MOUTH AND THROAT WITH OR WITHOUT PRESENCE OF ULCERS (sore throat, sores in mouth, or a toothache) UNUSUAL RASH, SWELLING OR PAIN  UNUSUAL VAGINAL DISCHARGE OR ITCHING   Items with * indicate a potential emergency and should be followed up as soon as possible or go to the Emergency Department if any problems should occur.  Please show the CHEMOTHERAPY ALERT CARD or IMMUNOTHERAPY  ALERT CARD at check-in to the Emergency Department and triage nurse.  Should you have questions after your visit or need to cancel or reschedule your appointment, please contact [facility] - A DEPT OF [facility]  Dept: [phone]  and follow the prompts.  Office hours are 8:00 a.m. to 4:30 p.m. Monday - Friday. Please note that voicemails left after 4:00 p.m. may not be returned until the following business day.  We are closed weekends and major holidays. You have access to a nurse at all times for urgent questions. Please call the main number to the clinic Dept: [phone] and follow the prompts.   For any non-urgent questions, you may also contact your provider using MyChart. We now offer e-Visits for anyone 80 and older to request care online for non-urgent symptoms. For details visit [email or web address].   Also download the MyChart app! Go to the app store, search "MyChart", open the app, select , and log in with your MyChart username and password.

## 2023-05-29 ENCOUNTER — Other Ambulatory Visit (HOSPITAL_COMMUNITY): Payer: Self-pay

## 2023-05-29 ENCOUNTER — Inpatient Hospital Stay (HOSPITAL_BASED_OUTPATIENT_CLINIC_OR_DEPARTMENT_OTHER): Payer: Medicare HMO | Admitting: Internal Medicine

## 2023-05-29 DIAGNOSIS — I639 Cerebral infarction, unspecified: Secondary | ICD-10-CM | POA: Insufficient documentation

## 2023-05-29 MED ORDER — CLOPIDOGREL BISULFATE 75 MG PO TABS
75.0000 mg | ORAL_TABLET | Freq: Every day | ORAL | 2 refills | Status: DC
  Filled 2023-05-29: qty 30, 30d supply, fill #0
  Filled 2023-06-27: qty 30, 30d supply, fill #1
  Filled 2023-07-28: qty 30, 30d supply, fill #2

## 2023-05-29 NOTE — Progress Notes (Signed)
I connected with [name] on 05/29/23 at  3:00 PM EST by telephone visit and verified that I am speaking with the correct person using two identifiers.  I discussed the limitations, risks, security and privacy concerns of performing an evaluation and management service by telemedicine and the availability of in-person appointments. I also discussed with the patient that there may be a patient responsible charge related to this service. The patient expressed understanding and agreed to proceed.  Other persons participating in the visit and their role in the encounter:  n/a  Patient's location:  Home Provider's location:  Office Chief Complaint:  Cerebrovascular accident (CVA), unspecified mechanism (HCC) - Plan: ECHOCARDIOGRAM COMPLETE BUBBLE STUDY, CT ANGIO HEAD NECK W WO CM  History of Present Ilness: [name] reports no clinical changes today.  No new or progressive neurologic symptoms.  Continues to follow with Dr. Arbutus Ped for small cell.  Remains independent with gait, no headaches or seizures.  Observations: Language and cognition at baseline  Imaging:  [facility] Clinician Interpretation: I have personally reviewed the CNS images as listed.  My interpretation, in the context of the patient's clinical presentation, is  new infarct R MCA, subacute  MR BRAIN W WO CONTRAST Result Date: 05/23/2023 CLINICAL DATA:  Brain/CNS neoplasm, assess treatment response. History of small cell lung cancer and HIV infection. EXAM: MRI HEAD WITHOUT AND WITH CONTRAST TECHNIQUE: Multiplanar, multiecho pulse sequences of the brain and surrounding structures were obtained without and with intravenous contrast. CONTRAST:  6mL GADAVIST GADOBUTROL 1 MMOL/ML IV SOLN COMPARISON:  11/11/2022.  09/15/2022. FINDINGS: Brain: No abnormal diffusion on today's study. Resolution of any diffusion abnormality associated with the right frontal opercular infarction. Mild chronic small-vessel ischemic change again affects pons.  No focal cerebellar insult. Cerebral hemispheres otherwise show old small vessel infarctions of the basal ganglia and white matter to a minimal degree. Old ischemic changes in the right parietal region present as seen previously. Resolution of multiple small foci of cortical enhancement in the right hemisphere consistent with prior infarctions. Single newly seen focus of cortical enhancement in the right parietal region, marked with arrows, consistent with an interval subacute cortical insult. 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 Carotid evaluation recommended. Small foci of hemosiderin deposition associated with some of the previous right MCA territory insults appears similar. No evidence of acute hemorrhage. Vascular: Major vessels at the base of the brain show flow. Skull and upper cervical spine: Negative Sinuses/Orbits: Minimal seasonal mucosal inflammatory changes. No advanced sinusitis. Orbits negative. Other: None IMPRESSION: 1. Findings are consistent with strokes rather than tumor. Dominant infarction in the right frontal operculum now looks chronic without any features worrisome for neoplastic disease. 2. Other smaller cortical infarctions previously seen scattered within the right middle cerebral artery territory affecting the frontal and parietal cortical brain no longer show enhancement. There is a newly seen small subacute cortical infarction in the right parietal region since the study of May. 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 3. Low level hemosiderin deposition in many of the regions right MCA territory insult. No evidence of acute hemorrhage. Electronically Signed   By: Paulina Fusi M.D.   On: 05/23/2023 08:33   DG Chest 2 View Result Date: 05/05/2023 CLINICAL DATA:  Shortness of breath. History of  right-sided lung cancer. EXAM: CHEST - 2 VIEW COMPARISON:  September 01, 2022. FINDINGS:  The heart size and mediastinal contours are within normal limits. Left internal jugular Port-A-Cath is noted. No acute pulmonary abnormality is noted. Large right lung opacity noted on prior exam is no longer visualized. The visualized skeletal structures are unremarkable. IMPRESSION: No acute pulmonary abnormality seen at this time. Electronically Signed   By: Lupita Raider M.D.   On: 05/05/2023 13:15    Assessment and Plan: Cerebrovascular accident (CVA), unspecified mechanism (HCC) - Plan: ECHOCARDIOGRAM COMPLETE BUBBLE STUDY, CT ANGIO HEAD NECK W WO CM  Etiology of right frontal lesion, followed over past 7 months with 3 MRIs, is now clearly vascular.  In addition, there is now a "new" subacute infarct distally within R MCA territory.  Stroke mechanism is unclear at this time, strong suspicion for large vessel atheromatous disease.  Recommended discontinuing ASA 325 and starting Plavix 75mg  daily given failure of Aspirin, with which she had maintained compliance.  Could also consider anticoagulation given concurrent malignancy.  Will recommend obtaining CT angio head and neck, as well as transthoracic echocardiogram to optimize secondary stroke prevention and identify mechanism.    Follow Up Instructions: We ask that [name] return to clinic in 1 months following testing, or sooner as needed.  I discussed the assessment and treatment plan with the patient.  The patient was provided an opportunity to ask questions and all were answered.  The patient agreed with the plan and demonstrated understanding of the instructions.    The patient was advised to call back or seek an in-person evaluation if the symptoms worsen or if the condition fails to improve as anticipated.    Henreitta Leber, MD   I provided 22 minutes of non face-to-face telephone visit time during this encounter, and > 50% was spent  counseling as documented under my assessment & plan.

## 2023-05-30 ENCOUNTER — Other Ambulatory Visit: Payer: Self-pay

## 2023-05-30 ENCOUNTER — Other Ambulatory Visit (HOSPITAL_COMMUNITY): Payer: Self-pay

## 2023-05-30 NOTE — Progress Notes (Signed)
Clinical Intervention Note  Clinical Intervention Notes: patient started loratadine and benzonatate, no DDIs were identified   Clinical Intervention Outcomes: Prevention of an adverse drug event   [name]

## 2023-05-30 NOTE — Progress Notes (Signed)
Specialty Pharmacy Refill Coordination Note  [name] is a 67 y.o. female contacted today regarding refills of specialty medication(s) Dolutegravir-lamiVUDine (DOVATO)   Patient requested Delivery   Delivery date: 06/06/23   Verified address: [address], [address] [place] [address]   Medication will be filled on 06/05/23.

## 2023-05-31 DIAGNOSIS — R6889 Other general symptoms and signs: Secondary | ICD-10-CM | POA: Diagnosis not present

## 2023-06-01 ENCOUNTER — Telehealth: Payer: Self-pay | Admitting: *Deleted

## 2023-06-01 NOTE — Telephone Encounter (Signed)
Received PC from patient requesting that we schedule her CT & echocardiogram.  Appointments made - informed patient CT is scheduled on 06/13/23 at 9:00 at [facility], she is not to have breakfast that morning, can have liquids only.  Echocardiogram is scheduled at [facility] on 06/23/23 at 8:00, entrance C.  She verbalizes understanding.

## 2023-06-05 ENCOUNTER — Other Ambulatory Visit: Payer: Self-pay

## 2023-06-13 ENCOUNTER — Ambulatory Visit (HOSPITAL_COMMUNITY)
Admission: RE | Admit: 2023-06-13 | Discharge: 2023-06-13 | Disposition: A | Discharge status: Home / Self Care | Payer: Medicare HMO | Source: Ambulatory Visit | Attending: Internal Medicine | Admitting: Internal Medicine

## 2023-06-13 DIAGNOSIS — I639 Cerebral infarction, unspecified: Secondary | ICD-10-CM | POA: Diagnosis not present

## 2023-06-13 DIAGNOSIS — I6523 Occlusion and stenosis of bilateral carotid arteries: Secondary | ICD-10-CM | POA: Diagnosis not present

## 2023-06-13 DIAGNOSIS — I6501 Occlusion and stenosis of right vertebral artery: Secondary | ICD-10-CM | POA: Diagnosis not present

## 2023-06-13 DIAGNOSIS — G9389 Other specified disorders of brain: Secondary | ICD-10-CM | POA: Diagnosis not present

## 2023-06-13 DIAGNOSIS — C3490 Malignant neoplasm of unspecified part of unspecified bronchus or lung: Secondary | ICD-10-CM | POA: Diagnosis not present

## 2023-06-13 DIAGNOSIS — R6889 Other general symptoms and signs: Secondary | ICD-10-CM | POA: Diagnosis not present

## 2023-06-13 MED ORDER — IOHEXOL 350 MG/ML SOLN
75.0000 mL | Freq: Once | INTRAVENOUS | Status: AC | PRN
  Administered 2023-06-13: 75 mL via INTRAVENOUS

## 2023-06-13 MED ORDER — HEPARIN SOD (PORK) LOCK FLUSH 100 UNIT/ML IV SOLN
INTRAVENOUS | Status: AC
  Filled 2023-06-13: qty 5

## 2023-06-13 MED ORDER — HEPARIN SOD (PORK) LOCK FLUSH 100 UNIT/ML IV SOLN
500.0000 [IU] | Freq: Once | INTRAVENOUS | Status: AC
  Administered 2023-06-13: 500 [IU] via INTRAVENOUS

## 2023-06-14 ENCOUNTER — Encounter: Payer: Self-pay | Admitting: Internal Medicine

## 2023-06-14 ENCOUNTER — Encounter: Payer: Self-pay | Admitting: Physician Assistant

## 2023-06-16 NOTE — Progress Notes (Signed)
 [facility] OFFICE PROGRESS NOTE  [name], DO [address] [place] [address]  DIAGNOSIS:  1) Extensive stage small cell lung cancer (T3, N3, M1 C) . She presented with large right lower lobe lung masses in addition to bulky right hilar and mediastinal lymphadenopathy as well as suspicious lytic bone lesion at L1. She was diagnosed in March 2024.  2) HIV diagnosed in March 2024 follows with Dr. Efrain from infectious disease  PRIOR THERAPY: None  CURRENT THERAPY: Systemic chemotherapy with carboplatin  for AUC of 4 on day 1 and etoposide  80 Mg/M2 on days 1, 2 and 3 with Imfinzi  1500 Mg on day 1 every 3 weeks with Neulasta  support on day 5. First dose September 14, 2022 . She is on dose reduced chemotherapy due to her HIV. Status post 11 cycle. Starting from cycle #5, she started single agent maintenance immunotherapy with Imfinzi  1500 mg IV every 4 weeks.   INTERVAL HISTORY: [name] 68 y.o. female returns to the clinic today for a follow-up visit.  The patient is followed for history of small cell lung cancer.  She is currently on maintenance immunotherapy with Imfinzi .   She states that she feels good. Denies any shortness of breath as she does not exert herself.  She had a cough for which she uses Nyquil. Her cough is improved at this time. She was on supplemental oxygen  when she was first diagnosed with lung cancer but has been off supplemental oxygen  since early in her diagnosis.  Denies any chest pain. Denies any nausea, vomiting, diarrhea, or constipation.  She follows closely with Dr. Buckley for routine brain MRIs. She recently had a head/neck CTA. She is expected to have echocardiogram performed on 06/23/23.  She is here today for evaluation before undergoing cycle #10.      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  MEDICATIONS:  Current Outpatient Medications  Medication Sig Dispense Refill   baclofen  (LIORESAL ) 10 MG tablet Take 1 tablet (10 mg total) by mouth 3 (three) times daily as needed for muscle spasms. 30 each 0   benzonatate  (TESSALON  PERLES) 100 MG capsule Take 1 capsule (100 mg total) by mouth 3 (three) times daily as needed for cough. 20 capsule 0   cholecalciferol (VITAMIN D3) 25 MCG (1000 UNIT) tablet Take 1,000 Units by mouth daily.     clopidogrel  (PLAVIX ) 75 MG tablet Take 1 tablet (75 mg total) by mouth daily. 30 tablet 2   diphenoxylate -atropine  (LOMOTIL ) 2.5-0.025 MG tablet Take 1 tablet by mouth 4 (four) times daily as needed for diarrhea or loose stools. 30 tablet 0   dolutegravir -lamiVUDine  (DOVATO ) 50-300 MG tablet Take 1 tablet by mouth daily. 30 tablet 11   folic acid  (FOLVITE ) 1 MG tablet Take 1 tablet (1 mg total) by mouth daily. 90 tablet 0   guaiFENesin  (MUCINEX ) 600 MG 12 hr tablet Take 1 tablet (600 mg total) by mouth 2 (two) times daily as needed. 30 tablet 0   lidocaine -prilocaine  (EMLA ) cream Apply 1 Application topically as needed. 30 g 2   loratadine  (CLARITIN ) 10 MG tablet Take 1 tablet (10 mg total) by mouth daily. 30 tablet  0   nicotine  (NICODERM CQ ) 7 mg/24hr patch Place 1 patch (7 mg total) onto the skin daily. 28 patch 0   nicotine  polacrilex (NICORETTE ) 4 MG gum Take 1 each (4 mg total) by mouth as needed for smoking cessation. 30 tablet 2   oxyCODONE -acetaminophen  (PERCOCET/ROXICET) 5-325 MG tablet Take 1 tablet by mouth every 8 (eight) hours as needed for severe pain. 30 tablet 0   prochlorperazine  (COMPAZINE ) 10 MG tablet Take 1 tablet (10 mg total) by mouth every 6 (six) hours as needed. 30 tablet 2   thiamine  (VITAMIN B-1) 100 MG tablet Take 1 tablet (100 mg total) by mouth daily. 90 tablet 0   triamcinolone  cream (KENALOG ) 0.1 %  Apply 1 Application topically 2 (two) times daily. 454 g 0   No current facility-administered medications for this visit.    SURGICAL HISTORY:  Past Surgical History:  Procedure Laterality Date   BRONCHIAL NEEDLE ASPIRATION BIOPSY  09/02/2022   Procedure: BRONCHIAL NEEDLE ASPIRATION BIOPSIES;  Surgeon: Brenna Adine CROME, DO;  Location: [facility];  Service: Cardiopulmonary;;   IR IMAGING GUIDED PORT INSERTION  09/29/2022   MULTIPLE TOOTH EXTRACTIONS     ORIF ANKLE FRACTURE  05/03/2012   Procedure: OPEN REDUCTION INTERNAL FIXATION (ORIF) ANKLE FRACTURE;  Surgeon: Norleen Armor, MD;  Location: [facility] OR;  Service: Orthopedics;  Laterality: Left;   VIDEO BRONCHOSCOPY WITH ENDOBRONCHIAL ULTRASOUND Bilateral 09/02/2022   Procedure: VIDEO BRONCHOSCOPY WITH ENDOBRONCHIAL ULTRASOUND;  Surgeon: Brenna Adine CROME, DO;  Location: [facility];  Service: Cardiopulmonary;  Laterality: Bilateral;    REVIEW OF SYSTEMS:   Review of Systems  Constitutional: Negative for appetite change, chills, fatigue, fever and unexpected weight change.  HENT: Negative for mouth sores, nosebleeds, sore throat and trouble swallowing.   Eyes: Negative for eye problems and icterus.  Respiratory: Positive for improved cough. Negative for hemoptysis, shortness of breath and wheezing.   Cardiovascular: Negative for chest pain and leg swelling.  Gastrointestinal: Negative for abdominal pain, constipation, diarrhea, nausea and vomiting.  Genitourinary: Negative for bladder incontinence, difficulty urinating, dysuria, frequency and hematuria.   Musculoskeletal: Negative for back pain, gait problem, neck pain and neck stiffness.  Skin: Negative for itching and rash.  Neurological: Negative for dizziness, extremity weakness, gait problem, headaches, light-headedness and seizures.  Hematological: Negative for adenopathy. Does not bruise/bleed easily.  Psychiatric/Behavioral: Negative for confusion, depression and sleep disturbance. The  patient is not nervous/anxious.     PHYSICAL EXAMINATION:  Blood pressure 113/72, pulse 91, temperature (!) 97.1 F (36.2 C), temperature source Temporal, resp. rate 15, weight 138 lb (62.6 kg), SpO2 96%.  ECOG PERFORMANCE STATUS: 1  Physical Exam  Constitutional: Oriented to person, place, and time and well-developed, well-nourished, and in no distress.  HENT:  Head: Normocephalic and atraumatic.  Mouth/Throat: Oropharynx is clear and moist. No oropharyngeal exudate.  Eyes: Conjunctivae are normal. Right eye exhibits no discharge. Left eye exhibits no discharge. No scleral icterus.  Neck: Normal range of motion. Neck supple.  Cardiovascular: Normal rate, regular rhythm, normal heart sounds and intact distal pulses.   Pulmonary/Chest: Effort normal and breath sounds normal. No respiratory distress. No wheezes. No rales.  Abdominal: Soft. Bowel sounds are normal. Exhibits no distension and no mass. There is no tenderness.  Musculoskeletal: Normal range of motion. Exhibits no edema.  Lymphadenopathy:    No cervical adenopathy.  Neurological: Alert and oriented to person, place, and time. Exhibits normal muscle tone. Gait normal. Coordination normal.  Skin: No significant rashes noted.  Some  dry skin on her upper extremities.  Not diaphoretic. No erythema. No pallor.  Psychiatric: Mood, memory and judgment normal.  Vitals reviewed.   LABORATORY DATA: Lab Results  Component Value Date   WBC 4.8 06/20/2023   HGB 12.6 06/20/2023   HCT 37.3 06/20/2023   MCV 97.6 06/20/2023   PLT 261 06/20/2023      Chemistry      Component Value Date/Time   NA 140 05/23/2023 0809   K 4.1 05/23/2023 0809   CL 109 05/23/2023 0809   CO2 25 05/23/2023 0809   BUN 11 05/23/2023 0809   CREATININE 0.75 05/23/2023 0809   CREATININE 0.53 09/21/2022 0942      Component Value Date/Time   CALCIUM 9.3 05/23/2023 0809   ALKPHOS 69 05/23/2023 0809   AST 14 (L) 05/23/2023 0809   ALT 10 05/23/2023 0809    BILITOT 0.3 05/23/2023 0809       RADIOGRAPHIC STUDIES:  CT ANGIO HEAD NECK W WO CM Result Date: 06/13/2023 CLINICAL DATA:  Stroke/TIA. Determine embolic source. Small cell lung cancer. EXAM: CT ANGIOGRAPHY HEAD AND NECK WITH AND WITHOUT CONTRAST TECHNIQUE: Multidetector CT imaging of the head and neck was performed using the standard protocol during bolus administration of intravenous contrast. Multiplanar CT image reconstructions and MIPs were obtained to evaluate the vascular anatomy. Carotid stenosis measurements (when applicable) are obtained utilizing NASCET criteria, using the distal internal carotid diameter as the denominator. RADIATION DOSE REDUCTION: This exam was performed according to the departmental dose-optimization program which includes automated exposure control, adjustment of the mA and/or kV according to patient size and/or use of iterative reconstruction technique. CONTRAST:  75mL OMNIPAQUE  IOHEXOL  350 MG/ML SOLN COMPARISON:  MR head without and with contrast 05/23/2023 in 11/11/2022. FINDINGS: CT HEAD FINDINGS Brain: No chronic encephalomalacia within the right frontal operculum is stable. No acute infarct, hemorrhage, or mass lesion is present. White matter is otherwise within normal limits. Deep brain nuclei are within normal limits. The ventricles are of normal size. No significant extraaxial fluid collection is present. The brainstem and cerebellum are within normal limits. Midline structures are within normal limits. Vascular: No hyperdense vessel or unexpected calcification. Skull: Calvarium is intact. No focal lytic or blastic lesions are present. No significant extracranial soft tissue lesion is present. Sinuses/Orbits: Moderate mucosal thickening is present throughout the ethmoid air cells in bilateral maxillary sinuses. Moderate mucosal thickening is present in the left sphenoid sinus. The frontal sinuses are hypoplastic. No fluid levels are present. Mastoid air cells are  clear. The globes and orbits are within normal limits. Review of the MIP images confirms the above findings CTA NECK FINDINGS Aortic arch: Atherosclerotic changes are present at the aortic arch and great vessel origins. No significant stenoses are present at the great vessel origins. No aneurysm or dissection is present in the aorta. Right carotid system: The right common carotid artery is within normal limits. Atherosclerotic changes are present at the bifurcation. The lumen is narrowed to 2.2 mm. This compares with 4 mm more distally. Left carotid system: The left common carotid artery is within normal limits. A high-grade stenosis is present in the proximal left internal carotid artery. The more distal lumen is symmetric to the right. Vertebral arteries: The left there scratched at the vertebral arteries originate from the subclavian arteries bilaterally. The high-grade stenosis is present proximal right vertebral artery just distal to the origin. No significant stenosis is present in the proximal left vertebral artery. No other significant stenoses are present in  either vertebral artery in the neck. Skeleton: The vertebral body heights alignment are normal. Endplate degenerative changes are present in the lower cervical spine in particular. No focal osseous lesions are present. Multiple dental caries are present. Other neck: Soft tissues the neck are otherwise unremarkable. Salivary glands are within normal limits. Thyroid  is normal. No significant adenopathy is present. No focal mucosal or submucosal lesions are present. Upper chest: Centrilobular emphysematous changes are present. No nodule or mass lesion is present. The thoracic inlet is within normal limits. Review of the MIP images confirms the above findings CTA HEAD FINDINGS Anterior circulation: Minimal calcifications are present within the cavernous internal carotid arteries bilaterally. No significant stenosis is present through the ICA termini. The A1  and M1 segments are normal. No definite anterior communicating arteries present. The MCA bifurcations are normal bilaterally. The ACA and MCA branch vessels are normal bilaterally. No aneurysm is present. Posterior circulation: The left vertebral artery is dominant. PICA origins are visualized and normal. Vertebrobasilar junction basilar artery is normal. The superior cerebellar arteries are patent. Both posterior cerebral arteries originate from the basilar tip. The PCA branch vessels are normal bilaterally. No aneurysm is present. Venous sinuses: The dural sinuses are patent. The straight sinus and deep cerebral veins are intact. Cortical veins are within normal limits. No significant vascular malformation is evident. Anatomic variants: None Review of the MIP images confirms the above findings IMPRESSION: 1. Stable chronic encephalomalacia within the right frontal operculum. 2. No acute intracranial abnormality or significant interval change. 3. 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 4. High-grade stenosis of the proximal right vertebral artery just distal to the origin. 5. No other significant proximal stenosis, aneurysm, or branch vessel occlusion within the Circle of Willis. No significant focal stenosis within the right carotid circulation. A less than 50% stenosis is present at the proximal right ICA. 6. Multiple dental caries. 7. Aortic Atherosclerosis (ICD10-I70.0) and Emphysema (ICD10-J43.9). Electronically Signed   By: Lonni Necessary M.D.   On: 06/13/2023 11:25   MR BRAIN W WO CONTRAST Result Date: 05/23/2023 CLINICAL DATA:  Brain/CNS neoplasm, assess treatment response. History of small cell lung cancer and HIV infection. EXAM: MRI HEAD WITHOUT AND WITH CONTRAST TECHNIQUE: Multiplanar, multiecho pulse sequences of the brain and surrounding structures were obtained without and with intravenous contrast. CONTRAST:  6mL GADAVIST  GADOBUTROL  1 MMOL/ML IV SOLN COMPARISON:   11/11/2022.  09/15/2022. FINDINGS: Brain: No abnormal diffusion on today's study. Resolution of any diffusion abnormality associated with the right frontal opercular infarction. Mild chronic small-vessel ischemic change again affects pons. No focal cerebellar insult. Cerebral hemispheres otherwise show old small vessel infarctions of the basal ganglia and white matter to a minimal degree. Old ischemic changes in the right parietal region present as seen previously. Resolution of multiple small foci of cortical enhancement in the right hemisphere consistent with prior infarctions. Single newly seen focus of cortical enhancement in the right parietal region, marked with arrows, consistent with an interval subacute cortical insult. 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 Carotid evaluation recommended. Small foci of hemosiderin deposition associated with some of the previous right MCA territory insults appears similar. No evidence of acute hemorrhage. Vascular: Major vessels at the base of the brain show flow. Skull and upper cervical spine: Negative Sinuses/Orbits: Minimal seasonal mucosal inflammatory changes. No advanced sinusitis. Orbits negative. Other: None IMPRESSION: 1. Findings are consistent with strokes rather than tumor. Dominant infarction in the right frontal operculum now  looks chronic without any features worrisome for neoplastic disease. 2. Other smaller cortical infarctions previously seen scattered within the right middle cerebral artery territory affecting the frontal and parietal cortical brain no longer show enhancement. There is a newly seen small subacute cortical infarction in the right parietal region since the study of May. 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 3. Low level hemosiderin deposition in many of the  regions right MCA territory insult. No evidence of acute hemorrhage. Electronically Signed   By: Oneil Officer M.D.   On: 05/23/2023 08:33     ASSESSMENT/PLAN:  This is a very pleasant 68 year old female with extensive stage small cell lung cancer (T3, N3, M1 C) . She presented with large right lower lobe lung masses in addition to bulky right hilar and mediastinal lymphadenopathy as well as suspicious lytic bone lesion at L1. She was diagnosed in March 2024.    She is currently on systemic chemotherapy with carboplatin  for AUC of 4 on day 1 and etoposide  80 Mg/M2 on days 1, 2 and 3 with Neulasta  support on day 5 as well as Imfinzi  1500 Mg IV on day 1 every 3 weeks. She had reduced dose of carboplatin  and etoposide  side because of her HIV status.  Starting with cycle #5, she started single agent maintenance immunotherapy with Imfinzi  1500 mg IV every 4 weeks she is status post 10 cycles and tolerated it well.   Labs were reviewed. Recommend she proceed with cycle #11 today as scheduled. Her Cmp is pending. As long as this is within parameters, she is ok to treat.   We will see her back for labs and follow up in 4 weeks for evaluation and repeat blood work before undergoing cycle #12.   I will arrange for a restaging CT scan of the CAP prior to her next appointment.   The patient was advised to call immediately if she has any concerning symptoms in the interval. The patient voices understanding of current disease status and treatment options and is in agreement with the current care plan. All questions were answered. The patient knows to call the clinic with any problems, questions or concerns. We can certainly see the patient much sooner if necessary            Orders Placed This Encounter  Procedures   CT CHEST ABDOMEN PELVIS W CONTRAST    Standing Status:   Future    Expected Date:   07/11/2023    Expiration Date:   06/19/2024    If indicated for the ordered procedure, I authorize the  administration of contrast media per Radiology protocol:   Yes    Does the patient have a contrast media/X-ray dye allergy?:   No    Preferred imaging location?:   [facility]    If indicated for the ordered procedure, I authorize the administration of oral contrast media per Radiology protocol:   Yes   CBC with Differential (Cancer Center Only)    Standing Status:   Future    Expected Date:   07/18/2023    Expiration Date:   07/17/2024   CMP (Cancer Center only)    Standing Status:   Future    Expected Date:   07/18/2023    Expiration Date:   07/17/2024   CBC with Differential (Cancer Center Only)    Standing Status:   Future  Expected Date:   08/15/2023    Expiration Date:   08/14/2024   CMP (Cancer Center only)    Standing Status:   Future    Expected Date:   08/15/2023    Expiration Date:   08/14/2024   CBC with Differential (Cancer Center Only)    Standing Status:   Future    Expected Date:   09/12/2023    Expiration Date:   09/11/2024   CMP (Cancer Center only)    Standing Status:   Future    Expected Date:   09/12/2023    Expiration Date:   09/11/2024   The total time spent in the appointment was 20-29 minutes  Fidencio Duddy L Archer Vise, PA-C 06/20/23

## 2023-06-19 ENCOUNTER — Encounter: Payer: Self-pay | Admitting: Physician Assistant

## 2023-06-19 ENCOUNTER — Encounter: Payer: Self-pay | Admitting: Internal Medicine

## 2023-06-20 ENCOUNTER — Inpatient Hospital Stay (HOSPITAL_BASED_OUTPATIENT_CLINIC_OR_DEPARTMENT_OTHER): Payer: 59 | Admitting: Physician Assistant

## 2023-06-20 ENCOUNTER — Inpatient Hospital Stay: Payer: 59 | Attending: Internal Medicine

## 2023-06-20 ENCOUNTER — Inpatient Hospital Stay: Payer: 59

## 2023-06-20 VITALS — BP 113/72 | HR 91 | Temp 97.1°F | Resp 15 | Wt 138.0 lb

## 2023-06-20 DIAGNOSIS — Z5112 Encounter for antineoplastic immunotherapy: Secondary | ICD-10-CM | POA: Insufficient documentation

## 2023-06-20 DIAGNOSIS — C3490 Malignant neoplasm of unspecified part of unspecified bronchus or lung: Secondary | ICD-10-CM | POA: Diagnosis not present

## 2023-06-20 DIAGNOSIS — C3431 Malignant neoplasm of lower lobe, right bronchus or lung: Secondary | ICD-10-CM | POA: Diagnosis not present

## 2023-06-20 DIAGNOSIS — Z95828 Presence of other vascular implants and grafts: Secondary | ICD-10-CM

## 2023-06-20 LAB — CMP (CANCER CENTER ONLY)
ALT: 13 U/L (ref 0–44)
AST: 17 U/L (ref 15–41)
Albumin: 3.9 g/dL (ref 3.5–5.0)
Alkaline Phosphatase: 68 U/L (ref 38–126)
Anion gap: 4 — ABNORMAL LOW (ref 5–15)
BUN: 17 mg/dL (ref 8–23)
CO2: 24 mmol/L (ref 22–32)
Calcium: 9.8 mg/dL (ref 8.9–10.3)
Chloride: 109 mmol/L (ref 98–111)
Creatinine: 0.83 mg/dL (ref 0.44–1.00)
GFR, Estimated: >60 mL/min (ref >60)
Glucose, Bld: 86 mg/dL (ref 70–99)
Potassium: 4 mmol/L (ref 3.5–5.1)
Sodium: 137 mmol/L (ref 135–145)
Total Bilirubin: 0.4 mg/dL (ref 0.0–1.2)
Total Protein: 8.9 g/dL — ABNORMAL HIGH (ref 6.5–8.1)

## 2023-06-20 LAB — CBC WITH DIFFERENTIAL (CANCER CENTER ONLY)
Abs Immature Granulocytes: 0.02 10*3/uL (ref 0.00–0.07)
Basophils Absolute: 0 10*3/uL (ref 0.0–0.1)
Basophils Relative: 1 %
Eosinophils Absolute: 0.3 10*3/uL (ref 0.0–0.5)
Eosinophils Relative: 6 %
HCT: 37.3 % (ref 36.0–46.0)
Hemoglobin: 12.6 g/dL (ref 12.0–15.0)
Immature Granulocytes: 0 %
Lymphocytes Relative: 25 %
Lymphs Abs: 1.2 10*3/uL (ref 0.7–4.0)
MCH: 33 pg (ref 26.0–34.0)
MCHC: 33.8 g/dL (ref 30.0–36.0)
MCV: 97.6 fL (ref 80.0–100.0)
Monocytes Absolute: 0.3 10*3/uL (ref 0.1–1.0)
Monocytes Relative: 6 %
Neutro Abs: 3 10*3/uL (ref 1.7–7.7)
Neutrophils Relative %: 62 %
Platelet Count: 261 10*3/uL (ref 150–400)
RBC: 3.82 MIL/uL — ABNORMAL LOW (ref 3.87–5.11)
RDW: 12.7 % (ref 11.5–15.5)
WBC Count: 4.8 10*3/uL (ref 4.0–10.5)
nRBC: 0 % (ref 0.0–0.2)

## 2023-06-20 MED ORDER — SODIUM CHLORIDE 0.9% FLUSH
10.0000 mL | INTRAVENOUS | Status: DC | PRN
  Administered 2023-06-20: 10 mL

## 2023-06-20 MED ORDER — SODIUM CHLORIDE 0.9% FLUSH
10.0000 mL | Freq: Once | INTRAVENOUS | Status: AC
Start: 2023-06-20 — End: 2023-06-20
  Administered 2023-06-20: 10 mL

## 2023-06-20 MED ORDER — SODIUM CHLORIDE 0.9 % IV SOLN: Freq: Once | INTRAVENOUS | Status: AC

## 2023-06-20 MED ORDER — SODIUM CHLORIDE 0.9 % IV SOLN
1500.0000 mg | Freq: Once | INTRAVENOUS | Status: AC
  Administered 2023-06-20: 1500 mg via INTRAVENOUS
  Filled 2023-06-20: qty 30

## 2023-06-20 MED ORDER — HEPARIN SOD (PORK) LOCK FLUSH 100 UNIT/ML IV SOLN
500.0000 [IU] | Freq: Once | INTRAVENOUS | Status: AC | PRN
  Administered 2023-06-20: 500 [IU]

## 2023-06-20 NOTE — Patient Instructions (Signed)

## 2023-06-21 ENCOUNTER — Telehealth: Payer: Self-pay

## 2023-06-21 ENCOUNTER — Other Ambulatory Visit: Payer: Self-pay

## 2023-06-21 NOTE — Telephone Encounter (Signed)
 TC to Dr. Eward office received--pt stating that her oral surgeon needs instruction regarding holding her Plavix  prior to surgery that is scheduled 07/24/23. Asked that she have surgeon's office fax [facility] whatever they are requesting prior to surgery. She reports that their staff has already informed her that they will be reaching out to MD's office. No problems or concerns reported at this time.

## 2023-06-22 ENCOUNTER — Other Ambulatory Visit: Payer: Self-pay

## 2023-06-22 ENCOUNTER — Encounter: Payer: Self-pay | Admitting: Physician Assistant

## 2023-06-22 ENCOUNTER — Encounter: Payer: Self-pay | Admitting: Internal Medicine

## 2023-06-22 NOTE — Progress Notes (Signed)
 Clinical Intervention Note  Clinical Intervention Notes: Patient reported starting Plavix . Per Up to Date, no DDI. No further intervention needed at this time.   Clinical Intervention Outcomes: Prevention of an adverse drug event   Mitzie GORMAN [facility] Specialty Pharmacist

## 2023-06-22 NOTE — Progress Notes (Signed)
 Specialty Pharmacy Refill Coordination Note  [name] is a 68 y.o. female contacted today regarding refills of specialty medication(s) Dolutegravir -lamiVUDine  (DOVATO )   Patient requested Delivery   Delivery date: 07/03/23   Verified address: [address], [address]   [place]  [address]   Medication will be filled on 06/30/23.

## 2023-06-23 ENCOUNTER — Other Ambulatory Visit: Payer: Self-pay

## 2023-06-23 ENCOUNTER — Ambulatory Visit (HOSPITAL_COMMUNITY): Admission: RE | Admit: 2023-06-23 | Payer: 59 | Source: Ambulatory Visit

## 2023-06-24 ENCOUNTER — Other Ambulatory Visit: Payer: Self-pay

## 2023-06-26 ENCOUNTER — Encounter: Payer: Self-pay | Admitting: Physician Assistant

## 2023-06-26 ENCOUNTER — Encounter: Payer: Self-pay | Admitting: Internal Medicine

## 2023-06-27 ENCOUNTER — Encounter: Payer: Self-pay | Admitting: Internal Medicine

## 2023-06-27 ENCOUNTER — Other Ambulatory Visit (HOSPITAL_COMMUNITY): Payer: Self-pay

## 2023-06-27 ENCOUNTER — Encounter: Payer: Self-pay | Admitting: Physician Assistant

## 2023-06-28 ENCOUNTER — Telehealth: Payer: Self-pay | Admitting: Medical Oncology

## 2023-06-28 ENCOUNTER — Other Ambulatory Visit: Payer: Self-pay | Admitting: Medical Oncology

## 2023-06-28 NOTE — Telephone Encounter (Signed)
 Pt requested "Diclofenac  Refill" . She got it in March as inpt and rubbed it on her back " it helped ease the back pain from coughing and the pain in her right hand. I told her to contact her PCP , which she has not .

## 2023-06-30 ENCOUNTER — Other Ambulatory Visit: Payer: Self-pay

## 2023-07-03 ENCOUNTER — Encounter: Payer: Self-pay | Admitting: Physician Assistant

## 2023-07-03 ENCOUNTER — Encounter: Payer: Self-pay | Admitting: Internal Medicine

## 2023-07-10 ENCOUNTER — Encounter: Payer: Self-pay | Admitting: Internal Medicine

## 2023-07-10 ENCOUNTER — Encounter: Payer: Self-pay | Admitting: Physician Assistant

## 2023-07-11 ENCOUNTER — Ambulatory Visit (HOSPITAL_COMMUNITY)
Admission: RE | Admit: 2023-07-11 | Discharge: 2023-07-11 | Disposition: A | Discharge status: Home / Self Care | Payer: 59 | Source: Ambulatory Visit | Attending: Physician Assistant | Admitting: Physician Assistant

## 2023-07-11 DIAGNOSIS — C3490 Malignant neoplasm of unspecified part of unspecified bronchus or lung: Secondary | ICD-10-CM | POA: Diagnosis present

## 2023-07-11 MED ORDER — HEPARIN SOD (PORK) LOCK FLUSH 100 UNIT/ML IV SOLN
INTRAVENOUS | Status: AC
  Filled 2023-07-11: qty 5

## 2023-07-11 MED ORDER — IOHEXOL 300 MG/ML  SOLN
100.0000 mL | Freq: Once | INTRAMUSCULAR | Status: AC | PRN
  Administered 2023-07-11: 100 mL via INTRAVENOUS

## 2023-07-12 ENCOUNTER — Other Ambulatory Visit: Payer: Self-pay | Admitting: Internal Medicine

## 2023-07-12 ENCOUNTER — Ambulatory Visit (HOSPITAL_COMMUNITY)
Admission: RE | Admit: 2023-07-12 | Discharge: 2023-07-12 | Disposition: A | Discharge status: Home / Self Care | Payer: 59 | Source: Ambulatory Visit | Attending: Internal Medicine

## 2023-07-12 DIAGNOSIS — C3431 Malignant neoplasm of lower lobe, right bronchus or lung: Secondary | ICD-10-CM

## 2023-07-12 DIAGNOSIS — I6389 Other cerebral infarction: Secondary | ICD-10-CM

## 2023-07-12 DIAGNOSIS — I639 Cerebral infarction, unspecified: Secondary | ICD-10-CM | POA: Diagnosis present

## 2023-07-12 DIAGNOSIS — Z8673 Personal history of transient ischemic attack (TIA), and cerebral infarction without residual deficits: Secondary | ICD-10-CM | POA: Diagnosis not present

## 2023-07-12 LAB — ECHOCARDIOGRAM COMPLETE BUBBLE STUDY
Area-P 1/2: 3.65 cm2
Calc EF: 54.6 %
S' Lateral: 3.4 cm
Single Plane A2C EF: 56.9 %
Single Plane A4C EF: 56.2 %

## 2023-07-12 NOTE — Progress Notes (Deleted)
 [facility] OFFICE PROGRESS NOTE  [name], DO [address] [place] [address]  DIAGNOSIS: 1) Extensive stage small cell lung cancer (T3, N3, M1 C) . She presented with large right lower lobe lung masses in addition to bulky right hilar and mediastinal lymphadenopathy as well as suspicious lytic bone lesion at L1. She was diagnosed in March 2024.  2) HIV diagnosed in March 2024 follows with Dr. Efrain from infectious disease  PRIOR THERAPY: None  CURRENT THERAPY: Systemic chemotherapy with carboplatin  for AUC of 4 on day 1 and etoposide  80 Mg/M2 on days 1, 2 and 3 with Imfinzi  1500 Mg on day 1 every 3 weeks with Neulasta  support on day 5. First dose September 14, 2022 . She is on dose reduced chemotherapy due to her HIV. Status post 12 cycles. Starting from cycle #5, she started single agent maintenance immunotherapy with Imfinzi  1500 mg IV every 4 weeks.   INTERVAL HISTORY: [name] 68 y.o. female returns returns to the clinic today for a follow-up visit.  The patient is followed for history of small cell lung cancer.  She is currently on maintenance immunotherapy with Imfinzi .    She states that she ***. Denies any shortness of breath as she does not exert herself.  She had a cough for which she uses Nyquil. Her cough is improved at this time***. She was on supplemental oxygen  when she was first diagnosed with lung cancer but has been off supplemental oxygen  since early in her diagnosis.  Denies any chest pain. Denies any nausea, vomiting, diarrhea, or constipation.  She follows closely with Dr. Buckley for routine brain MRIs. She recently had a restaging CT scan performed. 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  MEDICATIONS:  Current Outpatient Medications  Medication Sig Dispense Refill   baclofen  (LIORESAL ) 10 MG tablet Take 1 tablet (10 mg total) by mouth 3 (three) times daily as needed for muscle spasms. 30 each 0   benzonatate  (TESSALON  PERLES) 100 MG capsule Take 1 capsule (100 mg total) by mouth 3 (three) times daily as needed for cough. 20 capsule 0   cholecalciferol (VITAMIN D3) 25 MCG (1000 UNIT) tablet Take 1,000 Units by mouth daily.     clopidogrel  (PLAVIX ) 75 MG tablet Take 1 tablet (75 mg total) by mouth daily. 30 tablet 2   diphenoxylate -atropine  (LOMOTIL ) 2.5-0.025 MG tablet Take 1 tablet by mouth 4 (four) times daily as needed for diarrhea or loose stools. 30 tablet 0   dolutegravir -lamiVUDine  (DOVATO ) 50-300 MG tablet Take 1 tablet by mouth daily. 30 tablet 11   folic acid  (FOLVITE ) 1 MG tablet Take 1 tablet (1 mg total) by mouth daily. 90 tablet 0   guaiFENesin  (MUCINEX ) 600 MG 12 hr tablet Take 1 tablet (600 mg total) by mouth 2 (two) times daily as needed. 30 tablet 0   lidocaine -prilocaine  (EMLA ) cream Apply 1 Application topically as needed. 30 g 2   loratadine  (CLARITIN ) 10 MG tablet Take 1 tablet (10 mg total) by mouth daily. 30 tablet 0   nicotine  (NICODERM  CQ) 7 mg/24hr patch Place 1 patch (7 mg total) onto the skin daily. 28 patch 0   nicotine  polacrilex (NICORETTE ) 4 MG gum Take 1 each (4 mg total) by mouth as needed for smoking cessation. 30 tablet 2   oxyCODONE -acetaminophen  (PERCOCET/ROXICET) 5-325 MG tablet Take 1 tablet by mouth every 8 (eight) hours as needed for severe pain. 30 tablet 0   prochlorperazine  (COMPAZINE ) 10 MG tablet Take 1 tablet (10 mg total) by mouth every 6 (six) hours as needed. 30 tablet 2   thiamine  (VITAMIN B-1) 100 MG tablet Take 1 tablet (100 mg total) by mouth daily. 90 tablet 0   triamcinolone  cream (KENALOG ) 0.1 % Apply 1 Application  topically 2 (two) times daily. 454 g 0   No current facility-administered medications for this visit.    SURGICAL HISTORY:  Past Surgical History:  Procedure Laterality Date   BRONCHIAL NEEDLE ASPIRATION BIOPSY  09/02/2022   Procedure: BRONCHIAL NEEDLE ASPIRATION BIOPSIES;  Surgeon: Brenna Adine CROME, DO;  Location: [facility];  Service: Cardiopulmonary;;   IR IMAGING GUIDED PORT INSERTION  09/29/2022   MULTIPLE TOOTH EXTRACTIONS     ORIF ANKLE FRACTURE  05/03/2012   Procedure: OPEN REDUCTION INTERNAL FIXATION (ORIF) ANKLE FRACTURE;  Surgeon: Norleen Armor, MD;  Location: [facility] OR;  Service: Orthopedics;  Laterality: Left;   VIDEO BRONCHOSCOPY WITH ENDOBRONCHIAL ULTRASOUND Bilateral 09/02/2022   Procedure: VIDEO BRONCHOSCOPY WITH ENDOBRONCHIAL ULTRASOUND;  Surgeon: Brenna Adine CROME, DO;  Location: [facility];  Service: Cardiopulmonary;  Laterality: Bilateral;    REVIEW OF SYSTEMS:   Review of Systems  Constitutional: Negative for appetite change, chills, fatigue, fever and unexpected weight change.  HENT:   Negative for mouth sores, nosebleeds, sore throat and trouble swallowing.   Eyes: Negative for eye problems and icterus.  Respiratory: Negative for cough, hemoptysis, shortness of breath and wheezing.   Cardiovascular: Negative for chest pain and leg swelling.  Gastrointestinal: Negative for abdominal pain, constipation, diarrhea, nausea and vomiting.  Genitourinary: Negative for bladder incontinence, difficulty urinating, dysuria, frequency and hematuria.   Musculoskeletal: Negative for back pain, gait problem, neck pain and neck stiffness.  Skin: Negative for itching and rash.  Neurological: Negative for dizziness, extremity weakness, gait problem, headaches, light-headedness and seizures.  Hematological: Negative for adenopathy. Does not bruise/bleed easily.  Psychiatric/Behavioral: Negative for confusion, depression and sleep disturbance. The patient is not nervous/anxious.      PHYSICAL EXAMINATION:  There were no vitals taken for this visit.  ECOG PERFORMANCE STATUS: {CHL ONC ECOG [id number]  Physical Exam  Constitutional: Oriented to person, place, and time and well-developed, well-nourished, and in no distress. No distress.  HENT:  Head: Normocephalic and atraumatic.  Mouth/Throat: Oropharynx is clear and moist. No oropharyngeal exudate.  Eyes: Conjunctivae are normal. Right eye exhibits no discharge. Left eye exhibits no discharge. No scleral icterus.  Neck: Normal range of motion. Neck supple.  Cardiovascular: Normal rate, regular rhythm, normal heart sounds and intact distal pulses.   Pulmonary/Chest: Effort normal and breath sounds normal. No respiratory distress. No wheezes. No rales.  Abdominal: Soft. Bowel sounds are normal. Exhibits no distension and no mass. There is no tenderness.  Musculoskeletal: Normal range of motion. Exhibits no edema.  Lymphadenopathy:    No cervical adenopathy.  Neurological: Alert and oriented to person, place, and time. Exhibits normal muscle tone. Gait normal. Coordination normal.  Skin: Skin is warm and dry. No rash noted. Not diaphoretic. No erythema. No pallor.  Psychiatric: Mood, memory and judgment normal.  Vitals  reviewed.  LABORATORY DATA: Lab Results  Component Value Date   WBC 4.8 06/20/2023   HGB 12.6 06/20/2023   HCT 37.3 06/20/2023   MCV 97.6 06/20/2023   PLT 261 06/20/2023      Chemistry      Component Value Date/Time   NA 137 06/20/2023 0919   K 4.0 06/20/2023 0919   CL 109 06/20/2023 0919   CO2 24 06/20/2023 0919   BUN 17 06/20/2023 0919   CREATININE 0.83 06/20/2023 0919   CREATININE 0.53 09/21/2022 0942      Component Value Date/Time   CALCIUM 9.8 06/20/2023 0919   ALKPHOS 68 06/20/2023 0919   AST 17 06/20/2023 0919   ALT 13 06/20/2023 0919   BILITOT 0.4 06/20/2023 0919       RADIOGRAPHIC STUDIES:  ECHOCARDIOGRAM COMPLETE BUBBLE STUDY Result Date: 07/12/2023     ECHOCARDIOGRAM REPORT   Patient Name:   MATASHA SMIGELSKI Date of Exam: 07/12/2023 Medical Rec #:  [id number]      Height:       69.0 in Accession #:    [id number]     Weight:       138.0 lb Date of Birth:  Oct 04, 1955      BSA:          1.765 m Patient Age:    67 years       BP:           144/77 mmHg Patient Gender: F              HR:           80 bpm. Exam Location:  Outpatient Procedure: 2D Echo, Color Doppler, Cardiac Doppler and Saline Contrast Bubble            Study Indications:    Stroke 434.91 / I63.9  History:        Patient has no prior history of Echocardiogram examinations.  Sonographer:    Tinnie Gosling RDCS Referring Phys: [id number] ARTHEA K VASLOW IMPRESSIONS  1. Left ventricular ejection fraction, by estimation, is 55 to 60%. The left ventricle has normal function. The left ventricle has no regional wall motion abnormalities. Left ventricular diastolic parameters were normal.  2. Right ventricular systolic function is normal. The right ventricular size is normal. Tricuspid regurgitation signal is inadequate for assessing PA pressure.  3. The mitral valve is normal in structure. Trivial mitral valve regurgitation. No evidence of mitral stenosis.  4. The aortic valve is tricuspid. Aortic valve regurgitation is not visualized. Aortic valve sclerosis is present, with no evidence of aortic valve stenosis.  5. The inferior vena cava is normal in size with greater than 50% respiratory variability, suggesting right atrial pressure of 3 mmHg.  6. Agitated saline contrast bubble study was negative, with no evidence of any interatrial shunt. FINDINGS  Left Ventricle: Left ventricular ejection fraction, by estimation, is 55 to 60%. The left ventricle has normal function. The left ventricle has no regional wall motion abnormalities. The left ventricular internal cavity size was normal in size. There is  no left ventricular hypertrophy. Left ventricular diastolic parameters were normal. Normal left ventricular filling  pressure. Right Ventricle: The right ventricular size is normal. No increase in right ventricular wall thickness. Right ventricular systolic function is normal. Tricuspid regurgitation signal is inadequate for assessing PA pressure. Left Atrium: Left atrial size was normal in size. Right Atrium: Right atrial size was normal in size. Pericardium: There is no evidence of pericardial effusion. Mitral Valve:  The mitral valve is normal in structure. Trivial mitral valve regurgitation. No evidence of mitral valve stenosis. Tricuspid Valve: The tricuspid valve is normal in structure. Tricuspid valve regurgitation is not demonstrated. No evidence of tricuspid stenosis. Aortic Valve: The aortic valve is tricuspid. Aortic valve regurgitation is not visualized. Aortic valve sclerosis is present, with no evidence of aortic valve stenosis. Pulmonic Valve: The pulmonic valve was normal in structure. Pulmonic valve regurgitation is not visualized. No evidence of pulmonic stenosis. Aorta: The aortic root is normal in size and structure. Venous: The inferior vena cava is normal in size with greater than 50% respiratory variability, suggesting right atrial pressure of 3 mmHg. IAS/Shunts: No atrial level shunt detected by color flow Doppler. Agitated saline contrast was given intravenously to evaluate for intracardiac shunting. Agitated saline contrast bubble study was negative, with no evidence of any interatrial shunt.  LEFT VENTRICLE PLAX 2D LVIDd:         4.80 cm     Diastology LVIDs:         3.40 cm     LV e' medial:    8.16 cm/s LV PW:         0.80 cm     LV E/e' medial:  6.5 LV IVS:        0.80 cm     LV e' lateral:   10.30 cm/s LVOT diam:     2.00 cm     LV E/e' lateral: 5.1 LV SV:         45 LV SV Index:   25 LVOT Area:     3.14 cm  LV Volumes (MOD) LV vol d, MOD A2C: 74.7 ml LV vol d, MOD A4C: 77.8 ml LV vol s, MOD A2C: 32.2 ml LV vol s, MOD A4C: 34.1 ml LV SV MOD A2C:     42.5 ml LV SV MOD A4C:     77.8 ml LV SV MOD BP:       43.1 ml RIGHT VENTRICLE            IVC RV S prime:     9.46 cm/s  IVC diam: 1.40 cm TAPSE (M-mode): 2.0 cm LEFT ATRIUM             Index LA diam:        2.60 cm 1.47 cm/m LA Vol (A2C):   25.6 ml 14.51 ml/m LA Vol (A4C):   31.0 ml 17.57 ml/m LA Biplane Vol: 28.5 ml 16.15 ml/m  AORTIC VALVE LVOT Vmax:   86.80 cm/s LVOT Vmean:  48.400 cm/s LVOT VTI:    0.142 m  AORTA Ao Root diam: 2.80 cm MITRAL VALVE MV Area (PHT): 3.65 cm    SHUNTS MV Decel Time: 208 msec    Systemic VTI:  0.14 m MV E velocity: 52.70 cm/s  Systemic Diam: 2.00 cm MV A velocity: 80.60 cm/s MV E/A ratio:  0.65 Wilbert Bihari MD Electronically signed by Wilbert Bihari MD Signature Date/Time: 07/12/2023/10:55:40 AM    Final    CT ANGIO HEAD NECK W WO CM Result Date: 06/13/2023 CLINICAL DATA:  Stroke/TIA. Determine embolic source. Small cell lung cancer. EXAM: CT ANGIOGRAPHY HEAD AND NECK WITH AND WITHOUT CONTRAST TECHNIQUE: Multidetector CT imaging of the head and neck was performed using the standard protocol during bolus administration of intravenous contrast. Multiplanar CT image reconstructions and MIPs were obtained to evaluate the vascular anatomy. Carotid stenosis measurements (when applicable) are obtained utilizing NASCET criteria, using the distal internal carotid diameter as  the denominator. RADIATION DOSE REDUCTION: This exam was performed according to the departmental dose-optimization program which includes automated exposure control, adjustment of the mA and/or kV according to patient size and/or use of iterative reconstruction technique. CONTRAST:  75mL OMNIPAQUE  IOHEXOL  350 MG/ML SOLN COMPARISON:  MR head without and with contrast 05/23/2023 in 11/11/2022. FINDINGS: CT HEAD FINDINGS Brain: No chronic encephalomalacia within the right frontal operculum is stable. No acute infarct, hemorrhage, or mass lesion is present. White matter is otherwise within normal limits. Deep brain nuclei are within normal limits. The ventricles are of  normal size. No significant extraaxial fluid collection is present. The brainstem and cerebellum are within normal limits. Midline structures are within normal limits. Vascular: No hyperdense vessel or unexpected calcification. Skull: Calvarium is intact. No focal lytic or blastic lesions are present. No significant extracranial soft tissue lesion is present. Sinuses/Orbits: Moderate mucosal thickening is present throughout the ethmoid air cells in bilateral maxillary sinuses. Moderate mucosal thickening is present in the left sphenoid sinus. The frontal sinuses are hypoplastic. No fluid levels are present. Mastoid air cells are clear. The globes and orbits are within normal limits. Review of the MIP images confirms the above findings CTA NECK FINDINGS Aortic arch: Atherosclerotic changes are present at the aortic arch and great vessel origins. No significant stenoses are present at the great vessel origins. No aneurysm or dissection is present in the aorta. Right carotid system: The right common carotid artery is within normal limits. Atherosclerotic changes are present at the bifurcation. The lumen is narrowed to 2.2 mm. This compares with 4 mm more distally. Left carotid system: The left common carotid artery is within normal limits. A high-grade stenosis is present in the proximal left internal carotid artery. The more distal lumen is symmetric to the right. Vertebral arteries: The left there scratched at the vertebral arteries originate from the subclavian arteries bilaterally. The high-grade stenosis is present proximal right vertebral artery just distal to the origin. No significant stenosis is present in the proximal left vertebral artery. No other significant stenoses are present in either vertebral artery in the neck. Skeleton: The vertebral body heights alignment are normal. Endplate degenerative changes are present in the lower cervical spine in particular. No focal osseous lesions are present. Multiple  dental caries are present. Other neck: Soft tissues the neck are otherwise unremarkable. Salivary glands are within normal limits. Thyroid  is normal. No significant adenopathy is present. No focal mucosal or submucosal lesions are present. Upper chest: Centrilobular emphysematous changes are present. No nodule or mass lesion is present. The thoracic inlet is within normal limits. Review of the MIP images confirms the above findings CTA HEAD FINDINGS Anterior circulation: Minimal calcifications are present within the cavernous internal carotid arteries bilaterally. No significant stenosis is present through the ICA termini. The A1 and M1 segments are normal. No definite anterior communicating arteries present. The MCA bifurcations are normal bilaterally. The ACA and MCA branch vessels are normal bilaterally. No aneurysm is present. Posterior circulation: The left vertebral artery is dominant. PICA origins are visualized and normal. Vertebrobasilar junction basilar artery is normal. The superior cerebellar arteries are patent. Both posterior cerebral arteries originate from the basilar tip. The PCA branch vessels are normal bilaterally. No aneurysm is present. Venous sinuses: The dural sinuses are patent. The straight sinus and deep cerebral veins are intact. Cortical veins are within normal limits. No significant vascular malformation is evident. Anatomic variants: None Review of the MIP images confirms the above findings IMPRESSION: 1. 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 2. No acute intracranial abnormality or significant interval change. 3. 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 4. High-grade stenosis of the proximal right vertebral artery just distal to the origin. 5. No other significant proximal stenosis, aneurysm, or branch vessel occlusion within the Circle of Willis. No significant focal stenosis within the right carotid circulation. A less than 50%  stenosis is present at the proximal right ICA. 6. Multiple dental caries. 7. Aortic Atherosclerosis (ICD10-I70.0) and Emphysema (ICD10-J43.9). Electronically Signed   By: Lonni Necessary M.D.   On: 06/13/2023 11:25     ASSESSMENT/PLAN:  This is a very pleasant 68 year old female with extensive stage small cell lung cancer (T3, N3, M1 C) . She presented with large right lower lobe lung masses in addition to bulky right hilar and mediastinal lymphadenopathy as well as suspicious lytic bone lesion at L1. She was diagnosed in March 2024.    She is currently on systemic chemotherapy with carboplatin  for AUC of 4 on day 1 and etoposide  80 Mg/M2 on days 1, 2 and 3 with Neulasta  support on day 5 as well as Imfinzi  1500 Mg IV on day 1 every 3 weeks. She had reduced dose of carboplatin  and etoposide  side because of her HIV status.  Starting with cycle #5, she started single agent maintenance immunotherapy with Imfinzi  1500 mg IV every 4 weeks she is status post 12 cycles and tolerated it well.   The patient was seen with Dr. Sherrod today.  Dr. Sherrod personally and independently reviewed the scan and discussed results with the patient today.  The scan showed ***.  Dr. Sherrod recommends ***   Labs were reviewed. Recommend she proceed with cycle #13 today as scheduled. Her Cmp is pending. As long as this is within parameters, she is ok to treat.    We will see her back for labs and follow up in 4 weeks for evaluation and repeat blood work before undergoing cycle #14.   The patient was advised to call immediately if she has any concerning symptoms in the interval. The patient voices understanding of current disease status and treatment options and is in agreement with the current care plan. All questions were answered. The patient knows to call the clinic with any problems, questions or concerns. We can certainly see the patient much sooner if necessary   No orders of the defined types were placed in  this encounter.    I spent {CHL ONC TIME VISIT - DTPQU:[id number]} counseling the patient face to face. The total time spent in the appointment was {CHL ONC TIME VISIT - DTPQU:[id number]}.  Mikeala Girdler L Berlinda Farve, PA-C 07/12/23

## 2023-07-13 ENCOUNTER — Other Ambulatory Visit (HOSPITAL_COMMUNITY): Payer: Self-pay

## 2023-07-18 ENCOUNTER — Other Ambulatory Visit: Payer: Self-pay

## 2023-07-18 ENCOUNTER — Ambulatory Visit (INDEPENDENT_AMBULATORY_CARE_PROVIDER_SITE_OTHER): Payer: 59 | Admitting: Internal Medicine

## 2023-07-18 ENCOUNTER — Encounter: Payer: Self-pay | Admitting: Internal Medicine

## 2023-07-18 VITALS — BP 99/68 | HR 100 | Temp 98.2°F | Wt 140.0 lb

## 2023-07-18 DIAGNOSIS — Z21 Asymptomatic human immunodeficiency virus [HIV] infection status: Secondary | ICD-10-CM | POA: Diagnosis not present

## 2023-07-18 DIAGNOSIS — F17210 Nicotine dependence, cigarettes, uncomplicated: Secondary | ICD-10-CM | POA: Diagnosis not present

## 2023-07-18 DIAGNOSIS — R9089 Other abnormal findings on diagnostic imaging of central nervous system: Secondary | ICD-10-CM

## 2023-07-18 DIAGNOSIS — F17200 Nicotine dependence, unspecified, uncomplicated: Secondary | ICD-10-CM

## 2023-07-18 MED ORDER — DOLUTEGRAVIR-LAMIVUDINE 50-300 MG PO TABS
1.0000 | ORAL_TABLET | Freq: Every day | ORAL | 11 refills | Status: AC
  Filled 2023-07-18 – 2023-08-15 (×2): qty 30, 30d supply, fill #0
  Filled 2023-09-25: qty 30, 30d supply, fill #1
  Filled 2023-10-31: qty 30, 30d supply, fill #2
  Filled 2023-11-27 – 2023-12-04 (×2): qty 30, 30d supply, fill #3
  Filled 2023-12-27: qty 30, 30d supply, fill #4
  Filled 2024-01-19: qty 30, 30d supply, fill #5
  Filled 2024-02-23: qty 30, 30d supply, fill #6
  Filled 2024-03-22: qty 30, 30d supply, fill #7
  Filled 2024-04-19 – 2024-04-22 (×2): qty 30, 30d supply, fill #8
  Filled 2024-05-27 – 2024-06-10 (×3): qty 30, 30d supply, fill #9
  Filled 2024-07-04 – 2024-07-16 (×2): qty 30, 30d supply, fill #10

## 2023-07-18 NOTE — Progress Notes (Signed)
   Subjective:    Patient ID: [name], female    DOB: May 02, 1956, 68 y.o.   MRN: [id number]  HPI [name] is here for follow-up of HIV. She continues on Dovato  and denies any missed doses.  She had endorsed several missed doses prior to her last appointment in October.  He is undergoing evaluation for hand numbness.  Otherwise no new complaints.   Review of Systems  Constitutional:  Negative for fatigue.  Gastrointestinal:  Negative for diarrhea.  Skin:  Negative for rash.       Objective:   Physical Exam Eyes:     General: No scleral icterus. Pulmonary:     Effort: Pulmonary effort is normal.  Neurological:     Mental Status: She is alert.   SH: + tobacco        Assessment & Plan:

## 2023-07-18 NOTE — Assessment & Plan Note (Signed)
Continues to do well on Dovato with no missed doses since her last visit.  No issues with getting or taking it.  She will continue with this and refills provided.  Follow-up in 6 months

## 2023-07-18 NOTE — Assessment & Plan Note (Signed)
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  He is undergoing evaluation

## 2023-07-18 NOTE — Assessment & Plan Note (Signed)
Discussed the importance of cessation

## 2023-07-19 ENCOUNTER — Encounter: Payer: Self-pay | Admitting: Internal Medicine

## 2023-07-19 ENCOUNTER — Other Ambulatory Visit (HOSPITAL_COMMUNITY): Payer: Self-pay

## 2023-07-19 ENCOUNTER — Other Ambulatory Visit: Payer: Self-pay | Admitting: Physician Assistant

## 2023-07-19 ENCOUNTER — Inpatient Hospital Stay: Payer: 59 | Admitting: Physician Assistant

## 2023-07-19 ENCOUNTER — Telehealth: Payer: Self-pay

## 2023-07-19 ENCOUNTER — Inpatient Hospital Stay: Payer: 59

## 2023-07-19 ENCOUNTER — Ambulatory Visit: Payer: 59

## 2023-07-19 ENCOUNTER — Other Ambulatory Visit: Payer: Self-pay

## 2023-07-19 ENCOUNTER — Inpatient Hospital Stay: Payer: 59 | Attending: Internal Medicine

## 2023-07-19 DIAGNOSIS — C3431 Malignant neoplasm of lower lobe, right bronchus or lung: Secondary | ICD-10-CM

## 2023-07-19 DIAGNOSIS — Z139 Encounter for screening, unspecified: Secondary | ICD-10-CM

## 2023-07-19 NOTE — Telephone Encounter (Signed)
 Pt reports having a transportation issue today which led to her missing her appointments. Found out that [facility] no longer handles pt's transportation and that she has been told to arrange transportation through her insurance. TC to pt to inform of this, and she says she knew about this and that her issue today was with the transportation she receives through [employer], not [employer]. Advised that C. Heilingoetter, PA has ordered a SW referral to help resolve issues which are hindering her care. She verbalizes understanding.

## 2023-07-20 ENCOUNTER — Inpatient Hospital Stay: Payer: 59 | Attending: Internal Medicine | Admitting: Licensed Clinical Social Worker

## 2023-07-20 ENCOUNTER — Other Ambulatory Visit: Payer: Self-pay

## 2023-07-20 DIAGNOSIS — Z21 Asymptomatic human immunodeficiency virus [HIV] infection status: Secondary | ICD-10-CM | POA: Insufficient documentation

## 2023-07-20 DIAGNOSIS — C3431 Malignant neoplasm of lower lobe, right bronchus or lung: Secondary | ICD-10-CM | POA: Insufficient documentation

## 2023-07-20 DIAGNOSIS — Z5112 Encounter for antineoplastic immunotherapy: Secondary | ICD-10-CM | POA: Insufficient documentation

## 2023-07-20 LAB — HIV-1 RNA QUANT-NO REFLEX-BLD
HIV 1 RNA Quant: <20 {copies}/mL — ABNORMAL HIGH
HIV-1 RNA Quant, Log: <1.3 {Log} — ABNORMAL HIGH

## 2023-07-20 LAB — T-HELPER CELL (CD4) - (RCID CLINIC ONLY)
CD4 % Helper T Cell: 43 % (ref 33–65)
CD4 T Cell Abs: 395 /uL — ABNORMAL LOW (ref 400–1790)

## 2023-07-20 NOTE — Progress Notes (Signed)
 [facility] CSW Progress Note  Clinical Child Psychotherapist contacted patient by phone to check in after missed appointment.  Pt reports she has been reliant on transportation benefits provided through her [employer] which has been unreliable.  Pt states she reached out to [facility] transportation benefits and will be utilizing them moving forward hoping they will be more reliable.  Pt tolerating treatment well and reports she is feeling much better than she was at the beginning of treatment.  Pt reminded of supportive services available.  CSW to remain available as appropriate throughout duration of treatment to provide support.      Devere JONELLE Manna, LCSW Clinical Social Worker [facility]

## 2023-07-21 ENCOUNTER — Encounter: Payer: Self-pay | Admitting: Internal Medicine

## 2023-07-21 ENCOUNTER — Other Ambulatory Visit: Payer: Self-pay | Admitting: Physician Assistant

## 2023-07-21 ENCOUNTER — Telehealth: Payer: Self-pay | Admitting: Internal Medicine

## 2023-07-24 ENCOUNTER — Telehealth: Payer: Self-pay | Admitting: *Deleted

## 2023-07-24 NOTE — Telephone Encounter (Signed)
 Error

## 2023-07-24 NOTE — Telephone Encounter (Signed)
 Received call from patient.  She states that Dr Gollehon with [facility] needs a medical clearance and time frame of when she must stop taking her Plavix  prior to teeth extraction.  Called Dr Ernesto Heady office requested form to be faxed to us  to complete.  Patient is aware that we are pending this fax from their office.  She would like to proceed with procredure as soon as possible so turn around is greatly appreciated.

## 2023-07-25 NOTE — Progress Notes (Signed)
[facility] OFFICE PROGRESS NOTE  [name], DO [address] [place] [address]  DIAGNOSIS: 1) Extensive stage small cell lung cancer (T3, N3, M1 C) . She presented with large right lower lobe lung masses in addition to bulky right hilar and mediastinal lymphadenopathy as well as suspicious lytic bone lesion at L1. She was diagnosed in March 2024.  2) HIV diagnosed in March 2024 follows with Dr. Luciana Axe from infectious disease  PRIOR THERAPY: None  CURRENT THERAPY: Systemic chemotherapy with carboplatin for AUC of 4 on day 1 and etoposide 80 Mg/M2 on days 1, 2 and 3 with Imfinzi 1500 Mg on day 1 every 3 weeks with Neulasta support on day 5. First dose September 14, 2022 . She is on dose reduced chemotherapy due to her HIV. Status post 12 cycles. Starting from cycle #5, she started single agent maintenance immunotherapy with Imfinzi 1500 mg IV every 4 weeks.   INTERVAL HISTORY: [name] 68 y.o. female returns to the clinic today for a follow-up visit.  The patient is followed for history of small cell lung cancer.  She is currently on maintenance immunotherapy with Imfinzi.    She states that she feeling "ok just tired" today. She had a cold a few months ago that took a long time to recover. She is finally feeling like she is over her cold. Denies any shortness of breath. She denies significant cough at this time.   She was on supplemental oxygen when she was first diagnosed with lung cancer but has been off supplemental oxygen since early in her diagnosis.  Denies any chest pain. Denies any nausea, vomiting, diarrhea, or constipation.  She follows closely with Dr. Barbaraann Cao for routine brain MRIs. She recently had a restaging CT scan performed. She is here today for evaluation and to review her scan results before undergoing cycle #13.    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  MEDICATIONS:  Current Outpatient Medications  Medication Sig Dispense Refill   clopidogrel (PLAVIX) 75 MG tablet Take 1 tablet (75 mg total) by mouth daily. 30 tablet 2   dolutegravir-lamiVUDine (DOVATO) 50-300 MG tablet Take 1 tablet by mouth daily. 30 tablet 11   No current facility-administered medications for this visit.    SURGICAL HISTORY:  Past Surgical History:  Procedure Laterality Date   BRONCHIAL NEEDLE ASPIRATION BIOPSY  09/02/2022   Procedure: BRONCHIAL NEEDLE ASPIRATION BIOPSIES;  Surgeon: Josephine Igo, DO;  Location: [facility];  Service: Cardiopulmonary;;   IR IMAGING GUIDED PORT INSERTION  09/29/2022   MULTIPLE TOOTH EXTRACTIONS     ORIF ANKLE FRACTURE  05/03/2012   Procedure: OPEN REDUCTION INTERNAL FIXATION (ORIF) ANKLE FRACTURE;  Surgeon: Toni Arthurs, MD;  Location: [facility] OR;  Service: Orthopedics;  Laterality: Left;   VIDEO BRONCHOSCOPY WITH ENDOBRONCHIAL ULTRASOUND Bilateral 09/02/2022   Procedure: VIDEO BRONCHOSCOPY WITH ENDOBRONCHIAL ULTRASOUND;  Surgeon: Josephine Igo, DO;  Location: [facility];  Service: Cardiopulmonary;  Laterality: Bilateral;    REVIEW OF SYSTEMS:   Constitutional: Negative for appetite change, chills, fatigue, fever and unexpected weight change.  HENT: Negative for mouth sores, nosebleeds, sore throat and trouble swallowing.   Eyes:  Negative for eye problems and icterus.  Respiratory: Positive for improved cough. Negative for hemoptysis, shortness of breath and wheezing.   Cardiovascular: Negative for chest pain and leg swelling.  Gastrointestinal: Negative for abdominal pain, constipation, diarrhea, nausea and vomiting.  Genitourinary: Negative for bladder incontinence, difficulty urinating, dysuria, frequency and hematuria.   Musculoskeletal: Negative  for back pain, gait problem, neck pain and neck stiffness.  Skin: Negative for itching and rash.  Neurological: Negative for dizziness, extremity weakness, gait problem, headaches, light-headedness and seizures.  Hematological: Negative for adenopathy. Does not bruise/bleed easily.  Psychiatric/Behavioral: Negative for confusion, depression and sleep disturbance. The patient is not nervous/anxious.      PHYSICAL EXAMINATION:  Blood pressure 121/88, pulse 90, temperature (!) 97.5 F (36.4 C), temperature source Temporal, resp. rate 16, weight 144 lb 3.2 oz (65.4 kg), SpO2 98%.  ECOG PERFORMANCE STATUS: 1  Physical Exam  Constitutional: Oriented to person, place, and time and well-developed, well-nourished, and in no distress.  HENT:  Head: Normocephalic and atraumatic.  Mouth/Throat: Oropharynx is clear and moist. No oropharyngeal exudate.  Eyes: Conjunctivae are normal. Right eye exhibits no discharge. Left eye exhibits no discharge. No scleral icterus.  Neck: Normal range of motion. Neck supple.  Cardiovascular: Normal rate, regular rhythm, normal heart sounds and intact distal pulses.   Pulmonary/Chest: Effort normal and breath sounds normal. No respiratory distress. No wheezes. No rales.  Abdominal: Soft. Bowel sounds are normal. Exhibits no distension and no mass. There is no tenderness.  Musculoskeletal: Normal range of motion. Exhibits no edema.  Lymphadenopathy:    No cervical adenopathy.  Neurological: Alert and oriented to person, place, and time. Exhibits normal muscle tone. Gait normal. Coordination normal.  Skin: Skin is warm and dry. No rash noted. Not diaphoretic. No erythema. No pallor.  Psychiatric: Mood, memory and judgment normal.  Vitals reviewed.  LABORATORY DATA: Lab Results  Component Value Date   WBC 5.4 08/01/2023   HGB 11.9 (L) 08/01/2023   HCT 36.2 08/01/2023   MCV 98.6 08/01/2023   PLT 211 08/01/2023      Chemistry      Component Value Date/Time    NA 137 08/01/2023 0743   K 3.7 08/01/2023 0743   CL 107 08/01/2023 0743   CO2 26 08/01/2023 0743   BUN 17 08/01/2023 0743   CREATININE 0.76 08/01/2023 0743   CREATININE 0.53 09/21/2022 0942      Component Value Date/Time   CALCIUM 9.6 08/01/2023 0743   ALKPHOS 66 08/01/2023 0743   AST 15 08/01/2023 0743   ALT 11 08/01/2023 0743   BILITOT 0.5 08/01/2023 0743       RADIOGRAPHIC STUDIES:  CT CHEST ABDOMEN PELVIS W CONTRAST Result Date: 07/18/2023 CLINICAL DATA:  Small cell lung cancer. Assess treatment response. * Tracking Code: BO * EXAM: CT CHEST, ABDOMEN, AND PELVIS WITH CONTRAST TECHNIQUE: Multidetector CT imaging of the chest, abdomen and pelvis was performed following the standard protocol during bolus administration of intravenous contrast. RADIATION DOSE REDUCTION: This exam was performed according to the departmental dose-optimization program which includes automated exposure control, adjustment of the mA and/or kV according to patient size and/or use of iterative reconstruction technique. CONTRAST:  OMNIPAQUE IOHEXOL 300 MG/ML  SOLN COMPARISON:  04/17/2023 FINDINGS: CT CHEST FINDINGS Cardiovascular: No significant vascular findings. Normal heart size. No pericardial effusion. Port in the anterior chest wall with tip in distal SVC. Mediastinum/Nodes: 14 mm RIGHT hilar lymph node is unchanged. No mediastinal adenopathy. No supraclavicular adenopathy. Lungs/Pleura: Centrilobular  emphysema the upper lobes. A previously measured RIGHT upper lobe nodule measures 3 mm (image 36/series 6) compared to 6 mm. No new pulmonary nodules. Musculoskeletal: No aggressive osseous lesion. CT ABDOMEN AND PELVIS FINDINGS Hepatobiliary: Several benign hepatic cyst unchanged. Gallbladder normal. Pancreas: Pancreas is normal. No ductal dilatation. No pancreatic inflammation. Spleen: Normal spleen Adrenals/urinary tract: Adrenal glands and kidneys are normal. The ureters and bladder normal. Stomach/Bowel:  The stomach, duodenum, and small bowel normal. The colon and rectosigmoid colon are normal. Vascular/Lymphatic: Abdominal aorta is normal caliber with atherosclerotic calcification. There is no retroperitoneal or periportal lymphadenopathy. No pelvic lymphadenopathy. Reproductive: Post hysterectomy.  Uterus and adnexa unremarkable. Other: No free fluid. Musculoskeletal: No aggressive osseous lesion. IMPRESSION: CHEST: 1. Decreased size of RIGHT upper lobe pulmonary nodule. 2. Stable RIGHT hilar lymph node. 3. No new pulmonary nodules. PELVIS: No evidence of metastatic lung cancer in the abdomen pelvis. Aortic Atherosclerosis (ICD10-I70.0) and Emphysema (ICD10-J43.9). Electronically Signed   By: Genevive Bi M.D.   On: 07/18/2023 16:27   ECHOCARDIOGRAM COMPLETE BUBBLE STUDY Result Date: 07/12/2023    ECHOCARDIOGRAM REPORT   Patient Name:   [name] Date of Exam: 07/12/2023 Medical Rec #:  [id number]      Height:       69.0 in Accession #:    [id number]     Weight:       138.0 lb Date of Birth:  [birth year]      BSA:          1.765 m Patient Age:    67 years       BP:           144/77 mmHg Patient Gender: F              HR:           80 bpm. Exam Location:  Outpatient Procedure: 2D Echo, Color Doppler, Cardiac Doppler and Saline Contrast Bubble            Study Indications:    Stroke 434.91 / I63.9  History:        Patient has no prior history of Echocardiogram examinations.  Sonographer:    Harriette Bouillon RDCS Referring Phys: [id number] Earna Coder K VASLOW IMPRESSIONS  1. Left ventricular ejection fraction, by estimation, is 55 to 60%. The left ventricle has normal function. The left ventricle has no regional wall motion abnormalities. Left ventricular diastolic parameters were normal.  2. Right ventricular systolic function is normal. The right ventricular size is normal. Tricuspid regurgitation signal is inadequate for assessing PA pressure.  3. The mitral valve is normal in structure. Trivial mitral valve  regurgitation. No evidence of mitral stenosis.  4. The aortic valve is tricuspid. Aortic valve regurgitation is not visualized. Aortic valve sclerosis is present, with no evidence of aortic valve stenosis.  5. The inferior vena cava is normal in size with greater than 50% respiratory variability, suggesting right atrial pressure of 3 mmHg.  6. Agitated saline contrast bubble study was negative, with no evidence of any interatrial shunt. FINDINGS  Left Ventricle: Left ventricular ejection fraction, by estimation, is 55 to 60%. The left ventricle has normal function. The left ventricle has no regional wall motion abnormalities. The left ventricular internal cavity size was normal in size. There is  no left ventricular hypertrophy. Left ventricular diastolic parameters were normal. Normal left ventricular filling pressure. Right Ventricle: The right ventricular size is normal. No increase in right ventricular wall thickness. Right ventricular systolic function is normal.  Tricuspid regurgitation signal is inadequate for assessing PA pressure. Left Atrium: Left atrial size was normal in size. Right Atrium: Right atrial size was normal in size. Pericardium: There is no evidence of pericardial effusion. Mitral Valve: The mitral valve is normal in structure. Trivial mitral valve regurgitation. No evidence of mitral valve stenosis. Tricuspid Valve: The tricuspid valve is normal in structure. Tricuspid valve regurgitation is not demonstrated. No evidence of tricuspid stenosis. Aortic Valve: The aortic valve is tricuspid. Aortic valve regurgitation is not visualized. Aortic valve sclerosis is present, with no evidence of aortic valve stenosis. Pulmonic Valve: The pulmonic valve was normal in structure. Pulmonic valve regurgitation is not visualized. No evidence of pulmonic stenosis. Aorta: The aortic root is normal in size and structure. Venous: The inferior vena cava is normal in size with greater than 50% respiratory  variability, suggesting right atrial pressure of 3 mmHg. IAS/Shunts: No atrial level shunt detected by color flow Doppler. Agitated saline contrast was given intravenously to evaluate for intracardiac shunting. Agitated saline contrast bubble study was negative, with no evidence of any interatrial shunt.  LEFT VENTRICLE PLAX 2D LVIDd:         4.80 cm     Diastology LVIDs:         3.40 cm     LV e' medial:    8.16 cm/s LV PW:         0.80 cm     LV E/e' medial:  6.5 LV IVS:        0.80 cm     LV e' lateral:   10.30 cm/s LVOT diam:     2.00 cm     LV E/e' lateral: 5.1 LV SV:         45 LV SV Index:   25 LVOT Area:     3.14 cm  LV Volumes (MOD) LV vol d, MOD A2C: 74.7 ml LV vol d, MOD A4C: 77.8 ml LV vol s, MOD A2C: 32.2 ml LV vol s, MOD A4C: 34.1 ml LV SV MOD A2C:     42.5 ml LV SV MOD A4C:     77.8 ml LV SV MOD BP:      43.1 ml RIGHT VENTRICLE            IVC RV S prime:     9.46 cm/s  IVC diam: 1.40 cm TAPSE (M-mode): 2.0 cm LEFT ATRIUM             Index LA diam:        2.60 cm 1.47 cm/m LA Vol (A2C):   25.6 ml 14.51 ml/m LA Vol (A4C):   31.0 ml 17.57 ml/m LA Biplane Vol: 28.5 ml 16.15 ml/m  AORTIC VALVE LVOT Vmax:   86.80 cm/s LVOT Vmean:  48.400 cm/s LVOT VTI:    0.142 m  AORTA Ao Root diam: 2.80 cm MITRAL VALVE MV Area (PHT): 3.65 cm    SHUNTS MV Decel Time: 208 msec    Systemic VTI:  0.14 m MV E velocity: 52.70 cm/s  Systemic Diam: 2.00 cm MV A velocity: 80.60 cm/s MV E/A ratio:  0.65 Armanda Magic MD Electronically signed by Armanda Magic MD Signature Date/Time: 07/12/2023/10:55:40 AM    Final      ASSESSMENT/PLAN:  This is a very pleasant 68 year old female with extensive stage small cell lung cancer (T3, N3, M1 C) . She presented with large right lower lobe lung masses in addition to bulky right hilar and mediastinal lymphadenopathy as well as suspicious  lytic bone lesion at L1. She was diagnosed in March 2024.    She is currently on systemic chemotherapy with carboplatin for AUC of 4 on day 1 and  etoposide 80 Mg/M2 on days 1, 2 and 3 with Neulasta support on day 5 as well as Imfinzi 1500 Mg IV on day 1 every 3 weeks. She had reduced dose of carboplatin and etoposide side because of her HIV status.  Starting with cycle #5, she started single agent maintenance immunotherapy with Imfinzi 1500 mg IV every 4 weeks she is status post 12 cycles and tolerated it well.   The patient was seen with Dr. Arbutus Ped today.  Dr. Arbutus Ped personally and independently reviewed the scan and discussed results with the patient today.  The scan showed no evidence of disease progression.  Dr. Arbutus Ped recommends she continue on her same dose.    Labs were reviewed. Recommend she proceed with cycle #13 today as scheduled.    We will see her back for labs and follow up in 4 weeks for evaluation and repeat blood work before undergoing cycle #14.   The patient was advised to call immediately if she has any concerning symptoms in the interval. The patient voices understanding of current disease status and treatment options and is in agreement with the current care plan. All questions were answered. The patient knows to call the clinic with any problems, questions or concerns. We can certainly see the patient much sooner if necessary   Orders Placed This Encounter  Procedures   CBC with Differential (Cancer Center Only)    Standing Status:   Future    Expected Date:   03/12/2024    Expiration Date:   03/12/2025   CMP (Cancer Center only)    Standing Status:   Future    Expected Date:   03/12/2024    Expiration Date:   03/12/2025      Johnette Abraham Keilany Burnette, PA-C 08/01/23  ADDENDUM: Hematology/Oncology Attending:  I had a face-to-face encounter with the patient today.  I reviewed her records, lab, scan and recommended her care plan.  This is a very pleasant 68 years old African-American female with extensive stage small cell lung cancer diagnosed in March 2024 in addition to HIV diagnosed at the same time and  managed by Dr. Luciana Axe. Patient started systemic chemotherapy initially with carboplatin, etoposide and Imfinzi for 4 cycles and starting from cycle #5 she has been on maintenance treatment with single agent Imfinzi 1500 Mg IV every 4 weeks status post 8 more cycles.  The patient has been tolerating her treatment well with no concerning adverse effects except for the mild fatigue. She had repeat CT scan of the chest, abdomen and pelvis performed recently.  I personally and independently reviewed the scan and discussed the results with the patient today. Her scan showed no concerning findings for disease progression. I recommended for the patient to continue her current maintenance treatment with Imfinzi and she will proceed with cycle #13 today.  The patient was advised to call immediately if she has any other concerning symptoms in the interval.  The total time spent in the appointment was 30 minutes. Disclaimer: This note was dictated with voice recognition software. Similar sounding words can inadvertently be transcribed and may be missed upon review. Lajuana Matte, MD

## 2023-07-29 ENCOUNTER — Other Ambulatory Visit (HOSPITAL_COMMUNITY): Payer: Self-pay

## 2023-07-31 ENCOUNTER — Other Ambulatory Visit (HOSPITAL_COMMUNITY): Payer: Self-pay

## 2023-08-01 ENCOUNTER — Inpatient Hospital Stay (HOSPITAL_BASED_OUTPATIENT_CLINIC_OR_DEPARTMENT_OTHER): Payer: 59 | Admitting: Physician Assistant

## 2023-08-01 ENCOUNTER — Inpatient Hospital Stay: Payer: 59

## 2023-08-01 VITALS — BP 121/88 | HR 90 | Temp 97.5°F | Resp 16 | Wt 144.2 lb

## 2023-08-01 DIAGNOSIS — Z21 Asymptomatic human immunodeficiency virus [HIV] infection status: Secondary | ICD-10-CM | POA: Diagnosis not present

## 2023-08-01 DIAGNOSIS — C3431 Malignant neoplasm of lower lobe, right bronchus or lung: Secondary | ICD-10-CM

## 2023-08-01 DIAGNOSIS — Z95828 Presence of other vascular implants and grafts: Secondary | ICD-10-CM

## 2023-08-01 DIAGNOSIS — Z5112 Encounter for antineoplastic immunotherapy: Secondary | ICD-10-CM | POA: Diagnosis present

## 2023-08-01 LAB — CBC WITH DIFFERENTIAL (CANCER CENTER ONLY)
Abs Immature Granulocytes: 0.02 10*3/uL (ref 0.00–0.07)
Basophils Absolute: 0 10*3/uL (ref 0.0–0.1)
Basophils Relative: 1 %
Eosinophils Absolute: 0.2 10*3/uL (ref 0.0–0.5)
Eosinophils Relative: 3 %
HCT: 36.2 % (ref 36.0–46.0)
Hemoglobin: 11.9 g/dL — ABNORMAL LOW (ref 12.0–15.0)
Immature Granulocytes: 0 %
Lymphocytes Relative: 19 %
Lymphs Abs: 1 10*3/uL (ref 0.7–4.0)
MCH: 32.4 pg (ref 26.0–34.0)
MCHC: 32.9 g/dL (ref 30.0–36.0)
MCV: 98.6 fL (ref 80.0–100.0)
Monocytes Absolute: 0.4 10*3/uL (ref 0.1–1.0)
Monocytes Relative: 7 %
Neutro Abs: 3.8 10*3/uL (ref 1.7–7.7)
Neutrophils Relative %: 70 %
Platelet Count: 211 10*3/uL (ref 150–400)
RBC: 3.67 MIL/uL — ABNORMAL LOW (ref 3.87–5.11)
RDW: 13.3 % (ref 11.5–15.5)
WBC Count: 5.4 10*3/uL (ref 4.0–10.5)
nRBC: 0 % (ref 0.0–0.2)

## 2023-08-01 LAB — CMP (CANCER CENTER ONLY)
ALT: 11 U/L (ref 0–44)
AST: 15 U/L (ref 15–41)
Albumin: 3.8 g/dL (ref 3.5–5.0)
Alkaline Phosphatase: 66 U/L (ref 38–126)
Anion gap: 4 — ABNORMAL LOW (ref 5–15)
BUN: 17 mg/dL (ref 8–23)
CO2: 26 mmol/L (ref 22–32)
Calcium: 9.6 mg/dL (ref 8.9–10.3)
Chloride: 107 mmol/L (ref 98–111)
Creatinine: 0.76 mg/dL (ref 0.44–1.00)
GFR, Estimated: >60 mL/min (ref >60)
Glucose, Bld: 87 mg/dL (ref 70–99)
Potassium: 3.7 mmol/L (ref 3.5–5.1)
Sodium: 137 mmol/L (ref 135–145)
Total Bilirubin: 0.5 mg/dL (ref 0.0–1.2)
Total Protein: 7.9 g/dL (ref 6.5–8.1)

## 2023-08-01 MED ORDER — SODIUM CHLORIDE 0.9% FLUSH
10.0000 mL | Freq: Once | INTRAVENOUS | Status: AC
Start: 2023-08-01 — End: 2023-08-01
  Administered 2023-08-01: 10 mL

## 2023-08-01 MED ORDER — HEPARIN SOD (PORK) LOCK FLUSH 100 UNIT/ML IV SOLN
500.0000 [IU] | Freq: Once | INTRAVENOUS | Status: AC | PRN
  Administered 2023-08-01: 500 [IU]

## 2023-08-01 MED ORDER — SODIUM CHLORIDE 0.9% FLUSH
10.0000 mL | INTRAVENOUS | Status: DC | PRN
Start: 2023-08-01 — End: 2023-08-01
  Administered 2023-08-01: 10 mL

## 2023-08-01 MED ORDER — SODIUM CHLORIDE 0.9 % IV SOLN: Freq: Once | INTRAVENOUS | Status: AC

## 2023-08-01 MED ORDER — DURVALUMAB 500 MG/10ML IV SOLN
1500.0000 mg | Freq: Once | INTRAVENOUS | Status: AC
  Administered 2023-08-01: 1500 mg via INTRAVENOUS
  Filled 2023-08-01: qty 30

## 2023-08-01 NOTE — Patient Instructions (Signed)
 [facility] - A DEPT OF [facility]  Discharge Instructions: Thank you for choosing [facility] to provide your oncology and hematology care.   If you have a lab appointment with the Cancer Center, please go directly to the Cancer Center and check in at the registration area.   Wear comfortable clothing and clothing appropriate for easy access to any Portacath or PICC line.   We strive to give you quality time with your provider. You may need to reschedule your appointment if you arrive late (15 or more minutes).  Arriving late affects you and other patients whose appointments are after yours.  Also, if you miss three or more appointments without notifying the office, you may be dismissed from the clinic at the provider's discretion.      For prescription refill requests, have your pharmacy contact our office and allow 72 hours for refills to be completed.    Today you received the following chemotherapy and/or immunotherapy agents: imfinzi   To help prevent nausea and vomiting after your treatment, we encourage you to take your nausea medication as directed.  BELOW ARE SYMPTOMS THAT SHOULD BE REPORTED IMMEDIATELY: *FEVER GREATER THAN 100.4 F (38 C) OR HIGHER *CHILLS OR SWEATING *NAUSEA AND VOMITING THAT IS NOT CONTROLLED WITH YOUR NAUSEA MEDICATION *UNUSUAL SHORTNESS OF BREATH *UNUSUAL BRUISING OR BLEEDING *URINARY PROBLEMS (pain or burning when urinating, or frequent urination) *BOWEL PROBLEMS (unusual diarrhea, constipation, pain near the anus) TENDERNESS IN MOUTH AND THROAT WITH OR WITHOUT PRESENCE OF ULCERS (sore throat, sores in mouth, or a toothache) UNUSUAL RASH, SWELLING OR PAIN  UNUSUAL VAGINAL DISCHARGE OR ITCHING   Items with * indicate a potential emergency and should be followed up as soon as possible or go to the Emergency Department if any problems should occur.  Please show the CHEMOTHERAPY ALERT CARD or IMMUNOTHERAPY ALERT  CARD at check-in to the Emergency Department and triage nurse.  Should you have questions after your visit or need to cancel or reschedule your appointment, please contact [facility] - A DEPT OF [facility]  Dept: [phone]  and follow the prompts.  Office hours are 8:00 a.m. to 4:30 p.m. Monday - Friday. Please note that voicemails left after 4:00 p.m. may not be returned until the following business day.  We are closed weekends and major holidays. You have access to a nurse at all times for urgent questions. Please call the main number to the clinic Dept: [phone] and follow the prompts.   For any non-urgent questions, you may also contact your provider using MyChart. We now offer e-Visits for anyone 65 and older to request care online for non-urgent symptoms. For details visit [email or web address].   Also download the MyChart app! Go to the app store, search "MyChart", open the app, select [facility], and log in with your MyChart username and password.

## 2023-08-15 ENCOUNTER — Other Ambulatory Visit: Payer: 59

## 2023-08-15 ENCOUNTER — Other Ambulatory Visit: Payer: Self-pay

## 2023-08-15 ENCOUNTER — Ambulatory Visit: Payer: 59 | Admitting: Internal Medicine

## 2023-08-15 ENCOUNTER — Ambulatory Visit: Payer: 59

## 2023-08-15 NOTE — Progress Notes (Signed)
 Specialty Pharmacy Ongoing Clinical Assessment Note  [name] is a 68 y.o. female who is being followed by the specialty pharmacy service for RxSp HIV   Patient's specialty medication(s) reviewed today: Dolutegravir-lamiVUDine (DOVATO)   Missed doses in the last 4 weeks: 0   Patient/Caregiver did not have any additional questions or concerns.   Therapeutic benefit summary: Patient is achieving benefit   Adverse events/side effects summary: No adverse events/side effects   Patient's therapy is appropriate to: Continue    Goals Addressed             This Visit's Progress    Achieve Undetectable HIV Viral Load < 20   On track    Patient is on track. Patient will maintain adherence. Viral load remains undetectable since 11/28/22.          Follow up:  3 months  Otto Herb Specialty Pharmacist

## 2023-08-15 NOTE — Progress Notes (Signed)
 Specialty Pharmacy Refill Coordination Note  [name] is a 67 y.o. female contacted today regarding refills of specialty medication(s) Dolutegravir-lamiVUDine (DOVATO)   Patient requested Delivery   Delivery date: 08/23/23   Verified address: [address], [address]   [place] [address]   Medication will be filled on 08/22/23.

## 2023-08-21 ENCOUNTER — Other Ambulatory Visit: Payer: Self-pay

## 2023-08-21 ENCOUNTER — Other Ambulatory Visit (HOSPITAL_COMMUNITY): Payer: Self-pay

## 2023-08-21 MED ORDER — HYDROCODONE-ACETAMINOPHEN 5-325 MG PO TABS
ORAL_TABLET | ORAL | 0 refills | Status: DC
  Filled 2023-08-21 (×2): qty 16, 4d supply, fill #0

## 2023-08-21 MED ORDER — AMOXICILLIN 500 MG PO CAPS
500.0000 mg | ORAL_CAPSULE | Freq: Three times a day (TID) | ORAL | 0 refills | Status: DC
  Filled 2023-08-21 (×2): qty 21, 7d supply, fill #0

## 2023-08-24 ENCOUNTER — Other Ambulatory Visit (HOSPITAL_COMMUNITY): Payer: Self-pay

## 2023-08-24 ENCOUNTER — Other Ambulatory Visit: Payer: Self-pay

## 2023-08-24 MED ORDER — TRAMADOL HCL 50 MG PO TABS
50.0000 mg | ORAL_TABLET | Freq: Four times a day (QID) | ORAL | 0 refills | Status: DC | PRN
  Filled 2023-08-24: qty 18, 5d supply, fill #0

## 2023-08-29 ENCOUNTER — Inpatient Hospital Stay: Payer: 59

## 2023-08-29 ENCOUNTER — Inpatient Hospital Stay: Payer: 59 | Attending: Internal Medicine

## 2023-08-29 ENCOUNTER — Inpatient Hospital Stay: Payer: 59 | Attending: Internal Medicine | Admitting: Internal Medicine

## 2023-08-29 VITALS — BP 130/75 | HR 86 | Temp 98.2°F | Resp 18 | Ht 69.0 in | Wt 141.0 lb

## 2023-08-29 DIAGNOSIS — C3490 Malignant neoplasm of unspecified part of unspecified bronchus or lung: Secondary | ICD-10-CM | POA: Diagnosis not present

## 2023-08-29 DIAGNOSIS — Z5112 Encounter for antineoplastic immunotherapy: Secondary | ICD-10-CM | POA: Diagnosis not present

## 2023-08-29 DIAGNOSIS — C3431 Malignant neoplasm of lower lobe, right bronchus or lung: Secondary | ICD-10-CM | POA: Insufficient documentation

## 2023-08-29 DIAGNOSIS — Z21 Asymptomatic human immunodeficiency virus [HIV] infection status: Secondary | ICD-10-CM | POA: Diagnosis not present

## 2023-08-29 DIAGNOSIS — Z95828 Presence of other vascular implants and grafts: Secondary | ICD-10-CM

## 2023-08-29 LAB — CBC WITH DIFFERENTIAL (CANCER CENTER ONLY)
Abs Immature Granulocytes: 0.02 10*3/uL (ref 0.00–0.07)
Basophils Absolute: 0 10*3/uL (ref 0.0–0.1)
Basophils Relative: 1 %
Eosinophils Absolute: 0.2 10*3/uL (ref 0.0–0.5)
Eosinophils Relative: 4 %
HCT: 35.9 % — ABNORMAL LOW (ref 36.0–46.0)
Hemoglobin: 12 g/dL (ref 12.0–15.0)
Immature Granulocytes: 1 %
Lymphocytes Relative: 25 %
Lymphs Abs: 1.1 10*3/uL (ref 0.7–4.0)
MCH: 32.8 pg (ref 26.0–34.0)
MCHC: 33.4 g/dL (ref 30.0–36.0)
MCV: 98.1 fL (ref 80.0–100.0)
Monocytes Absolute: 0.3 10*3/uL (ref 0.1–1.0)
Monocytes Relative: 6 %
Neutro Abs: 2.8 10*3/uL (ref 1.7–7.7)
Neutrophils Relative %: 63 %
Platelet Count: 250 10*3/uL (ref 150–400)
RBC: 3.66 MIL/uL — ABNORMAL LOW (ref 3.87–5.11)
RDW: 12.6 % (ref 11.5–15.5)
WBC Count: 4.4 10*3/uL (ref 4.0–10.5)
nRBC: 0 % (ref 0.0–0.2)

## 2023-08-29 LAB — CMP (CANCER CENTER ONLY)
ALT: 10 U/L (ref 0–44)
AST: 13 U/L — ABNORMAL LOW (ref 15–41)
Albumin: 3.8 g/dL (ref 3.5–5.0)
Alkaline Phosphatase: 63 U/L (ref 38–126)
Anion gap: 4 — ABNORMAL LOW (ref 5–15)
BUN: 10 mg/dL (ref 8–23)
CO2: 28 mmol/L (ref 22–32)
Calcium: 9.7 mg/dL (ref 8.9–10.3)
Chloride: 108 mmol/L (ref 98–111)
Creatinine: 0.68 mg/dL (ref 0.44–1.00)
GFR, Estimated: >60 mL/min (ref >60)
Glucose, Bld: 114 mg/dL — ABNORMAL HIGH (ref 70–99)
Potassium: 3.5 mmol/L (ref 3.5–5.1)
Sodium: 140 mmol/L (ref 135–145)
Total Bilirubin: 0.4 mg/dL (ref 0.0–1.2)
Total Protein: 8.2 g/dL — ABNORMAL HIGH (ref 6.5–8.1)

## 2023-08-29 MED ORDER — SODIUM CHLORIDE 0.9% FLUSH
10.0000 mL | Freq: Once | INTRAVENOUS | Status: AC
  Administered 2023-08-29: 10 mL

## 2023-08-29 MED ORDER — SODIUM CHLORIDE 0.9 % IV SOLN
1500.0000 mg | Freq: Once | INTRAVENOUS | Status: AC
  Administered 2023-08-29: 1500 mg via INTRAVENOUS
  Filled 2023-08-29: qty 30

## 2023-08-29 MED ORDER — SODIUM CHLORIDE 0.9 % IV SOLN: Freq: Once | INTRAVENOUS | Status: AC

## 2023-08-29 NOTE — Patient Instructions (Signed)
 [facility] - A DEPT OF [facility]  Discharge Instructions: Thank you for choosing [facility] to provide your oncology and hematology care.   If you have a lab appointment with the Cancer Center, please go directly to the Cancer Center and check in at the registration area.   Wear comfortable clothing and clothing appropriate for easy access to any Portacath or PICC line.   We strive to give you quality time with your provider. You may need to reschedule your appointment if you arrive late (15 or more minutes).  Arriving late affects you and other patients whose appointments are after yours.  Also, if you miss three or more appointments without notifying the office, you may be dismissed from the clinic at the provider's discretion.      For prescription refill requests, have your pharmacy contact our office and allow 72 hours for refills to be completed.    Today you received the following chemotherapy and/or immunotherapy agents: imfinzi   To help prevent nausea and vomiting after your treatment, we encourage you to take your nausea medication as directed.  BELOW ARE SYMPTOMS THAT SHOULD BE REPORTED IMMEDIATELY: *FEVER GREATER THAN 100.4 F (38 C) OR HIGHER *CHILLS OR SWEATING *NAUSEA AND VOMITING THAT IS NOT CONTROLLED WITH YOUR NAUSEA MEDICATION *UNUSUAL SHORTNESS OF BREATH *UNUSUAL BRUISING OR BLEEDING *URINARY PROBLEMS (pain or burning when urinating, or frequent urination) *BOWEL PROBLEMS (unusual diarrhea, constipation, pain near the anus) TENDERNESS IN MOUTH AND THROAT WITH OR WITHOUT PRESENCE OF ULCERS (sore throat, sores in mouth, or a toothache) UNUSUAL RASH, SWELLING OR PAIN  UNUSUAL VAGINAL DISCHARGE OR ITCHING   Items with * indicate a potential emergency and should be followed up as soon as possible or go to the Emergency Department if any problems should occur.  Please show the CHEMOTHERAPY ALERT CARD or IMMUNOTHERAPY ALERT  CARD at check-in to the Emergency Department and triage nurse.  Should you have questions after your visit or need to cancel or reschedule your appointment, please contact [facility] - A DEPT OF [facility]  Dept: [phone]  and follow the prompts.  Office hours are 8:00 a.m. to 4:30 p.m. Monday - Friday. Please note that voicemails left after 4:00 p.m. may not be returned until the following business day.  We are closed weekends and major holidays. You have access to a nurse at all times for urgent questions. Please call the main number to the clinic Dept: [phone] and follow the prompts.   For any non-urgent questions, you may also contact your provider using MyChart. We now offer e-Visits for anyone 65 and older to request care online for non-urgent symptoms. For details visit [email or web address].   Also download the MyChart app! Go to the app store, search "MyChart", open the app, select [facility], and log in with your MyChart username and password.

## 2023-08-29 NOTE — Progress Notes (Signed)
 [facility] [phone]   [phone]) [phone]  OFFICE PROGRESS NOTE  Joan Skeeters, DO [address] [place] [address]  DIAGNOSIS:  1) Extensive stage small cell lung cancer (T3, N3, M1 C) . She presented with large right lower lobe lung masses in addition to bulky right hilar and mediastinal lymphadenopathy as well as suspicious lytic bone lesion at L1. She was diagnosed in March 2024.  2) HIV diagnosed in March 2024   PRIOR THERAPY: None   CURRENT THERAPY: Systemic chemotherapy with carboplatin for AUC of 4 on day 1 and etoposide 80 Mg/M2 on days 1, 2 and 3 with Imfinzi 1500 Mg on day 1 every 3 weeks with Neulasta support on day 5. First dose September 14, 2022 . She is on dose reduced chemotherapy due to her HIV. Status post 8 cycles.  Starting from cycle #5 her treatment with with maintenance immunotherapy with Imfinzi 1500 Mg IV every 4 weeks  INTERVAL HISTORY: NUVIA HILEMAN 68 y.o. female returns to the clinic today for follow-up visit. Discussed the use of AI scribe software for clinical note transcription with the patient, who gave verbal consent to proceed.  History of Present Illness   [name] is a 68 year old female with extensive stage small cell lung cancer who presents for ongoing treatment.  She was diagnosed with extensive stage small cell lung cancer in March 2024 and initially started chemotherapy with carboplatin and Imfinzi for four cycles. Since then, she has been on Imfinzi every four weeks and has completed a total of thirteen cycles. No chest pain, shortness of breath, nausea, vomiting, or diarrhea.  Recently, she had her teeth extracted due to grinding and deterioration. She is scheduled to receive dental implants on the thirty-first of the month. She humorously noted that the implants need to be secured tightly to avoid any mishaps.  She has experienced some weight loss, attributed to her inability to chew for a week,  resulting in a three-pound weight loss.       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  MEDICATIONS:  Current Outpatient Medications  Medication Sig Dispense Refill   amoxicillin (AMOXIL) 500 MG capsule Take 1 capsule (500 mg total) by mouth 3 (three) times daily. 21 capsule 0   clopidogrel (PLAVIX) 75 MG tablet Take 1 tablet (75 mg total) by mouth daily. 30 tablet 2   dolutegravir-lamiVUDine (DOVATO) 50-300 MG tablet Take 1 tablet by mouth daily. 30 tablet 11   HYDROcodone-acetaminophen (NORCO/VICODIN) 5-325 MG tablet Take 1 tablet by mouth every 4-6 hours as needed for pain. 16 tablet 0   traMADol (ULTRAM) 50 MG tablet Take 1 tablet (50 mg total) by mouth every 6 (six) hours as needed for pain. 18 tablet 0   No current facility-administered medications for this visit.    SURGICAL HISTORY:  Past Surgical History:  Procedure Laterality Date   BRONCHIAL NEEDLE ASPIRATION BIOPSY  09/02/2022   Procedure: BRONCHIAL NEEDLE ASPIRATION BIOPSIES;  Surgeon: Josephine Igo, DO;  [facility];  Service: Cardiopulmonary;;   IR IMAGING GUIDED PORT INSERTION  09/29/2022   MULTIPLE TOOTH EXTRACTIONS  ORIF ANKLE FRACTURE  05/03/2012   Procedure: OPEN REDUCTION INTERNAL FIXATION (ORIF) ANKLE FRACTURE;  Surgeon: Toni Arthurs, MD;  Location: [facility];  Service: Orthopedics;  Laterality: Left;   VIDEO BRONCHOSCOPY WITH ENDOBRONCHIAL ULTRASOUND Bilateral 09/02/2022   Procedure: VIDEO BRONCHOSCOPY WITH ENDOBRONCHIAL ULTRASOUND;  Surgeon: Josephine Igo, DO;  Location: [facility];  Service: Cardiopulmonary;  Laterality: Bilateral;    REVIEW OF SYSTEMS:  A comprehensive review of systems was negative except for:  Constitutional: positive for fatigue Ears, nose, mouth, throat, and face: positive for dental issues    PHYSICAL EXAMINATION: General appearance: alert, cooperative, and no distress Head: Normocephalic, without obvious abnormality, atraumatic Neck: no adenopathy, no JVD, supple, symmetrical, trachea midline, and thyroid not enlarged, symmetric, no tenderness/mass/nodules Lymph nodes: Cervical, supraclavicular, and axillary nodes normal. Resp: clear to auscultation bilaterally Back: symmetric, no curvature. ROM normal. No CVA tenderness. Cardio: regular rate and rhythm, S1, S2 normal, no murmur, click, rub or gallop GI: soft, non-tender; bowel sounds normal; no masses,  no organomegaly Extremities: extremities normal, atraumatic, no cyanosis or edema  ECOG PERFORMANCE STATUS: 1 - Symptomatic but completely ambulatory  Blood pressure 130/75, pulse 86, temperature 98.2 F (36.8 C), temperature source Temporal, resp. rate 18, height 5\' 9"  (1.753 m), weight 141 lb (64 kg), SpO2 100%.  LABORATORY DATA: Lab Results  Component Value Date   WBC 4.4 08/29/2023   HGB 12.0 08/29/2023   HCT 35.9 (L) 08/29/2023   MCV 98.1 08/29/2023   PLT 250 08/29/2023      Chemistry      Component Value Date/Time   NA 140 08/29/2023 1010   K 3.5 08/29/2023 1010   CL 108 08/29/2023 1010   CO2 28 08/29/2023 1010   BUN 10 08/29/2023 1010   CREATININE 0.68 08/29/2023 1010   CREATININE 0.53 09/21/2022 0942      Component Value Date/Time   CALCIUM 9.7 08/29/2023 1010   ALKPHOS 63 08/29/2023 1010   AST 13 (L) 08/29/2023 1010   ALT 10 08/29/2023 1010   BILITOT 0.4 08/29/2023 1010       RADIOGRAPHIC STUDIES: No results found.  ASSESSMENT AND PLAN: This is a very pleasant 68 years old African-American female with Extensive stage small cell lung cancer (T3, N3, M1 C) . She presented with large right lower lobe lung masses in addition to bulky right hilar and mediastinal lymphadenopathy as well as  suspicious lytic bone lesion at L1. She was diagnosed in March 2024.  The patient also has HIV diagnosed in March 2024 She is currently undergoing systemic chemotherapy with carboplatin for AUC of 4 on day 1 and etoposide 80 Mg/M2 on days 1, 2 and 3 with Imfinzi 1500 Mg on day 1 every 3 weeks with Neulasta support on day 5. First dose September 14, 2022 . She is on dose reduced chemotherapy due to her HIV. Status post 13 cycles.  Starting cycle #5 her treatment is with maintenance immunotherapy with Imfinzi 1500 Mg IV every 4 weeks.  She has been tolerating her treatment fairly well.    Extensive stage small cell lung cancer Diagnosed in March 2024, she has been treated with carboplatin and Imfinzi for four cycles, followed by maintenance Imfinzi every four weeks. She has completed thirteen cycles. Currently, she reports no chest pain, dyspnea, nausea, vomiting, or diarrhea. There is a weight loss of three pounds, likely due to dental issues affecting mastication. Labs are adequate for treatment continuation. A follow-up scan is planned to assess cancer status, as the  last scan was in late January. - Proceed with cycle fourteen of Imfinzi. - Order a scan in three weeks to assess cancer status. - Schedule follow-up appointment in four weeks.  Dental issues She reports tooth extraction due to bruxism and deterioration. Dental implants are scheduled for the thirty-first of the month. - Proceed with dental implant procedure as scheduled.   The patient was advised to call immediately if she has any concerning symptoms in the interval. The patient voices understanding of current disease status and treatment options and is in agreement with the current care plan.  All questions were answered. The patient knows to call the clinic with any problems, questions or concerns. We can certainly see the patient much sooner if necessary.  The total time spent in the appointment was 20 minutes.  Disclaimer: This note  was dictated with voice recognition software. Similar sounding words can inadvertently be transcribed and may not be corrected upon review.

## 2023-09-03 DIAGNOSIS — R0600 Dyspnea, unspecified: Secondary | ICD-10-CM | POA: Diagnosis not present

## 2023-09-05 DIAGNOSIS — R0600 Dyspnea, unspecified: Secondary | ICD-10-CM | POA: Diagnosis not present

## 2023-09-12 ENCOUNTER — Other Ambulatory Visit: Payer: Self-pay

## 2023-09-14 ENCOUNTER — Other Ambulatory Visit: Payer: Self-pay

## 2023-09-18 ENCOUNTER — Other Ambulatory Visit: Payer: Self-pay

## 2023-09-21 ENCOUNTER — Other Ambulatory Visit: Payer: Self-pay | Admitting: Internal Medicine

## 2023-09-21 ENCOUNTER — Ambulatory Visit (HOSPITAL_COMMUNITY)
Admission: RE | Admit: 2023-09-21 | Discharge: 2023-09-21 | Disposition: A | Discharge status: Home / Self Care | Source: Ambulatory Visit | Attending: Internal Medicine | Admitting: Internal Medicine

## 2023-09-21 DIAGNOSIS — C3490 Malignant neoplasm of unspecified part of unspecified bronchus or lung: Secondary | ICD-10-CM

## 2023-09-21 DIAGNOSIS — R590 Localized enlarged lymph nodes: Secondary | ICD-10-CM | POA: Diagnosis not present

## 2023-09-21 DIAGNOSIS — R911 Solitary pulmonary nodule: Secondary | ICD-10-CM | POA: Diagnosis not present

## 2023-09-21 DIAGNOSIS — J432 Centrilobular emphysema: Secondary | ICD-10-CM | POA: Diagnosis not present

## 2023-09-21 MED ORDER — HEPARIN SOD (PORK) LOCK FLUSH 100 UNIT/ML IV SOLN
500.0000 [IU] | Freq: Once | INTRAVENOUS | Status: AC
  Administered 2023-09-21: 500 [IU] via INTRAVENOUS

## 2023-09-21 MED ORDER — SODIUM CHLORIDE (PF) 0.9 % IJ SOLN
INTRAMUSCULAR | Status: AC
Start: 2023-09-21 — End: ?
  Filled 2023-09-21: qty 50

## 2023-09-21 MED ORDER — HEPARIN SOD (PORK) LOCK FLUSH 100 UNIT/ML IV SOLN
INTRAVENOUS | Status: AC
  Filled 2023-09-21: qty 5

## 2023-09-21 MED ORDER — IOHEXOL 300 MG/ML  SOLN
100.0000 mL | Freq: Once | INTRAMUSCULAR | Status: AC | PRN
  Administered 2023-09-21: 100 mL via INTRAVENOUS

## 2023-09-22 ENCOUNTER — Other Ambulatory Visit: Payer: Self-pay

## 2023-09-25 ENCOUNTER — Other Ambulatory Visit: Payer: Self-pay

## 2023-09-25 NOTE — Progress Notes (Signed)
 Specialty Pharmacy Refill Coordination Note  [name] is a 68 y.o. female contacted today regarding refills of specialty medication(s) Dolutegravir-lamiVUDine (DOVATO)   Patient requested Delivery   Delivery date: 09/28/23   Verified address: [address], [address]   [place] [address]   Medication will be filled on 04.16.25.

## 2023-09-26 ENCOUNTER — Inpatient Hospital Stay (HOSPITAL_BASED_OUTPATIENT_CLINIC_OR_DEPARTMENT_OTHER): Payer: 59 | Admitting: Internal Medicine

## 2023-09-26 ENCOUNTER — Inpatient Hospital Stay: Payer: 59 | Attending: Internal Medicine

## 2023-09-26 ENCOUNTER — Inpatient Hospital Stay: Payer: 59

## 2023-09-26 VITALS — BP 135/80 | HR 88 | Temp 98.0°F | Resp 17 | Ht 69.0 in | Wt 138.8 lb

## 2023-09-26 DIAGNOSIS — C3431 Malignant neoplasm of lower lobe, right bronchus or lung: Secondary | ICD-10-CM | POA: Diagnosis not present

## 2023-09-26 DIAGNOSIS — Z5112 Encounter for antineoplastic immunotherapy: Secondary | ICD-10-CM | POA: Diagnosis not present

## 2023-09-26 DIAGNOSIS — Z79620 Long term (current) use of immunosuppressive biologic: Secondary | ICD-10-CM | POA: Diagnosis not present

## 2023-09-26 DIAGNOSIS — Z21 Asymptomatic human immunodeficiency virus [HIV] infection status: Secondary | ICD-10-CM | POA: Insufficient documentation

## 2023-09-26 DIAGNOSIS — C3400 Malignant neoplasm of unspecified main bronchus: Secondary | ICD-10-CM

## 2023-09-26 DIAGNOSIS — M899 Disorder of bone, unspecified: Secondary | ICD-10-CM | POA: Diagnosis not present

## 2023-09-26 DIAGNOSIS — Z95828 Presence of other vascular implants and grafts: Secondary | ICD-10-CM

## 2023-09-26 LAB — CBC WITH DIFFERENTIAL (CANCER CENTER ONLY)
Abs Immature Granulocytes: 0.02 10*3/uL (ref 0.00–0.07)
Basophils Absolute: 0 10*3/uL (ref 0.0–0.1)
Basophils Relative: 1 %
Eosinophils Absolute: 0.2 10*3/uL (ref 0.0–0.5)
Eosinophils Relative: 4 %
HCT: 35.4 % — ABNORMAL LOW (ref 36.0–46.0)
Hemoglobin: 11.7 g/dL — ABNORMAL LOW (ref 12.0–15.0)
Immature Granulocytes: 1 %
Lymphocytes Relative: 30 %
Lymphs Abs: 1.3 10*3/uL (ref 0.7–4.0)
MCH: 32.7 pg (ref 26.0–34.0)
MCHC: 33.1 g/dL (ref 30.0–36.0)
MCV: 98.9 fL (ref 80.0–100.0)
Monocytes Absolute: 0.3 10*3/uL (ref 0.1–1.0)
Monocytes Relative: 8 %
Neutro Abs: 2.4 10*3/uL (ref 1.7–7.7)
Neutrophils Relative %: 56 %
Platelet Count: 216 10*3/uL (ref 150–400)
RBC: 3.58 MIL/uL — ABNORMAL LOW (ref 3.87–5.11)
RDW: 12.9 % (ref 11.5–15.5)
WBC Count: 4.3 10*3/uL (ref 4.0–10.5)
nRBC: 0 % (ref 0.0–0.2)

## 2023-09-26 LAB — CMP (CANCER CENTER ONLY)
ALT: 9 U/L (ref 0–44)
AST: 14 U/L — ABNORMAL LOW (ref 15–41)
Albumin: 3.8 g/dL (ref 3.5–5.0)
Alkaline Phosphatase: 62 U/L (ref 38–126)
Anion gap: 3 — ABNORMAL LOW (ref 5–15)
BUN: 15 mg/dL (ref 8–23)
CO2: 27 mmol/L (ref 22–32)
Calcium: 9.4 mg/dL (ref 8.9–10.3)
Chloride: 109 mmol/L (ref 98–111)
Creatinine: 0.64 mg/dL (ref 0.44–1.00)
GFR, Estimated: >60 mL/min (ref >60)
Glucose, Bld: 91 mg/dL (ref 70–99)
Potassium: 3.7 mmol/L (ref 3.5–5.1)
Sodium: 139 mmol/L (ref 135–145)
Total Bilirubin: 0.3 mg/dL (ref 0.0–1.2)
Total Protein: 7.8 g/dL (ref 6.5–8.1)

## 2023-09-26 LAB — TSH: TSH: 2.536 u[IU]/mL (ref 0.350–4.500)

## 2023-09-26 MED ORDER — SODIUM CHLORIDE 0.9% FLUSH
10.0000 mL | Freq: Once | INTRAVENOUS | Status: AC
Start: 2023-09-26 — End: 2023-09-26
  Administered 2023-09-26: 10 mL

## 2023-09-26 MED ORDER — SODIUM CHLORIDE 0.9 % IV SOLN: Freq: Once | INTRAVENOUS | Status: AC

## 2023-09-26 MED ORDER — HEPARIN SOD (PORK) LOCK FLUSH 100 UNIT/ML IV SOLN
500.0000 [IU] | Freq: Once | INTRAVENOUS | Status: AC | PRN
Start: 2023-09-26 — End: 2023-09-26
  Administered 2023-09-26: 500 [IU]

## 2023-09-26 MED ORDER — SODIUM CHLORIDE 0.9% FLUSH
10.0000 mL | INTRAVENOUS | Status: DC | PRN
  Administered 2023-09-26: 10 mL

## 2023-09-26 MED ORDER — SODIUM CHLORIDE 0.9 % IV SOLN
1500.0000 mg | Freq: Once | INTRAVENOUS | Status: AC
  Administered 2023-09-26: 1500 mg via INTRAVENOUS
  Filled 2023-09-26: qty 30

## 2023-09-26 NOTE — Patient Instructions (Signed)
 [facility] - A DEPT OF [facility]  Discharge Instructions: Thank you for choosing [facility] to provide your oncology and hematology care.   If you have a lab appointment with the Cancer Center, please go directly to the Cancer Center and check in at the registration area.   Wear comfortable clothing and clothing appropriate for easy access to any Portacath or PICC line.   We strive to give you quality time with your provider. You may need to reschedule your appointment if you arrive late (15 or more minutes).  Arriving late affects you and other patients whose appointments are after yours.  Also, if you miss three or more appointments without notifying the office, you may be dismissed from the clinic at the provider's discretion.      For prescription refill requests, have your pharmacy contact our office and allow 72 hours for refills to be completed.    Today you received the following chemotherapy and/or immunotherapy agents: durvalumab (IMFINZI)       To help prevent nausea and vomiting after your treatment, we encourage you to take your nausea medication as directed.  BELOW ARE SYMPTOMS THAT SHOULD BE REPORTED IMMEDIATELY: *FEVER GREATER THAN 100.4 F (38 C) OR HIGHER *CHILLS OR SWEATING *NAUSEA AND VOMITING THAT IS NOT CONTROLLED WITH YOUR NAUSEA MEDICATION *UNUSUAL SHORTNESS OF BREATH *UNUSUAL BRUISING OR BLEEDING *URINARY PROBLEMS (pain or burning when urinating, or frequent urination) *BOWEL PROBLEMS (unusual diarrhea, constipation, pain near the anus) TENDERNESS IN MOUTH AND THROAT WITH OR WITHOUT PRESENCE OF ULCERS (sore throat, sores in mouth, or a toothache) UNUSUAL RASH, SWELLING OR PAIN  UNUSUAL VAGINAL DISCHARGE OR ITCHING   Items with * indicate a potential emergency and should be followed up as soon as possible or go to the Emergency Department if any problems should occur.  Please show the CHEMOTHERAPY ALERT CARD or  IMMUNOTHERAPY ALERT CARD at check-in to the Emergency Department and triage nurse.  Should you have questions after your visit or need to cancel or reschedule your appointment, please contact [facility] - A DEPT OF [facility]  Dept: [phone]  and follow the prompts.  Office hours are 8:00 a.m. to 4:30 p.m. Monday - Friday. Please note that voicemails left after 4:00 p.m. may not be returned until the following business day.  We are closed weekends and major holidays. You have access to a nurse at all times for urgent questions. Please call the main number to the clinic Dept: [phone] and follow the prompts.   For any non-urgent questions, you may also contact your provider using MyChart. We now offer e-Visits for anyone 39 and older to request care online for non-urgent symptoms. For details visit [email or web address].   Also download the MyChart app! Go to the app store, search "MyChart", open the app, select [facility], and log in with your MyChart username and password.

## 2023-09-26 NOTE — Progress Notes (Signed)
 [facility] [phone]   Fax:(336) [phone]  OFFICE PROGRESS NOTE  [name], DO [address] [place] [address]  DIAGNOSIS:  1) Extensive stage small cell lung cancer (T3, N3, M1 C) . She presented with large right lower lobe lung masses in addition to bulky right hilar and mediastinal lymphadenopathy as well as suspicious lytic bone lesion at L1. She was diagnosed in March 2024.  2) HIV diagnosed in March 2024   PRIOR THERAPY: None   CURRENT THERAPY: Systemic chemotherapy with carboplatin for AUC of 4 on day 1 and etoposide 80 Mg/M2 on days 1, 2 and 3 with Imfinzi 1500 Mg on day 1 every 3 weeks with Neulasta support on day 5. First dose September 14, 2022 . She is on dose reduced chemotherapy due to her HIV. Status post 14 cycles.  Starting from cycle #5 her treatment with with maintenance immunotherapy with Imfinzi 1500 Mg IV every 4 weeks  INTERVAL HISTORY: OLIVIAROSE PUNCH 68 y.o. female returns to the clinic today for follow-up visit. Discussed the use of AI scribe software for clinical note transcription with the patient, who gave verbal consent to proceed.  History of Present Illness   CARLEEN RHUE is a 68 year old female with small cell lung cancer who presents for cycle fifteen of chemotherapy and restaging scans.  She has a history of extensive stage small cell lung cancer diagnosed in March 2024 and has been undergoing chemotherapy with carboplatin, etoposide, and Imfinzi. She has completed fourteen cycles and is here for cycle fifteen of single agent Imfinzi. Her treatment schedule involves receiving Immunotherapy every four weeks.  No new symptoms or changes in her health status since her last visit four weeks ago. No chest pain, shortness of breath, cough, nausea, vomiting, or diarrhea. She mentions some mild inflammation in the lung, which she attributes to dust in her house due to uncleaned vents and potential mold from a leaking  sink.  She continues to take her medication, specifically noting the use of Novato once a day. She also mentions a recent visit to her HIV doctor, who confirmed a negative status.       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  MEDICATIONS:  Current Outpatient Medications  Medication Sig Dispense Refill   amoxicillin (AMOXIL) 500 MG capsule Take 1 capsule (500 mg total) by mouth 3 (three) times daily. 21 capsule 0   clopidogrel (PLAVIX) 75 MG tablet Take 1 tablet (75 mg total) by mouth daily. 30 tablet 2   dolutegravir-lamiVUDine (DOVATO) 50-300 MG tablet Take 1 tablet by mouth daily. 30 tablet 11   HYDROcodone-acetaminophen (NORCO/VICODIN) 5-325 MG tablet Take 1 tablet by mouth every 4-6 hours as needed for pain. 16 tablet 0   traMADol (ULTRAM) 50 MG tablet Take 1 tablet (50 mg total) by mouth every 6 (six) hours as needed for pain. 18 tablet 0   No current facility-administered medications for this visit.    SURGICAL HISTORY:  Past Surgical History:  Procedure Laterality Date   BRONCHIAL NEEDLE ASPIRATION BIOPSY  09/02/2022   Procedure: BRONCHIAL NEEDLE ASPIRATION BIOPSIES;  Surgeon: Josephine Igo, DO;  [facility]: [facility];  Service: Cardiopulmonary;;   IR IMAGING GUIDED PORT INSERTION  09/29/2022   MULTIPLE TOOTH EXTRACTIONS     ORIF ANKLE FRACTURE  05/03/2012   Procedure: OPEN REDUCTION INTERNAL FIXATION (ORIF) ANKLE FRACTURE;  Surgeon: Amada Backer, MD;  Location: [facility];  Service: Orthopedics;  Laterality: Left;   VIDEO BRONCHOSCOPY WITH ENDOBRONCHIAL ULTRASOUND Bilateral 09/02/2022   Procedure: VIDEO BRONCHOSCOPY WITH ENDOBRONCHIAL ULTRASOUND;  Surgeon: Prudy Brownie, DO;  Location: [facility];   Service: Cardiopulmonary;  Laterality: Bilateral;    REVIEW OF SYSTEMS:  Constitutional: negative Eyes: negative Ears, nose, mouth, throat, and face: negative Respiratory: negative Cardiovascular: negative Gastrointestinal: negative Genitourinary:negative Integument/breast: negative Hematologic/lymphatic: negative Musculoskeletal:negative Neurological: negative Behavioral/Psych: negative Endocrine: negative Allergic/Immunologic: negative   PHYSICAL EXAMINATION: General appearance: alert, cooperative, and no distress Head: Normocephalic, without obvious abnormality, atraumatic Neck: no adenopathy, no JVD, supple, symmetrical, trachea midline, and thyroid not enlarged, symmetric, no tenderness/mass/nodules Lymph nodes: Cervical, supraclavicular, and axillary nodes normal. Resp: clear to auscultation bilaterally Back: symmetric, no curvature. ROM normal. No CVA tenderness. Cardio: regular rate and rhythm, S1, S2 normal, no murmur, click, rub or gallop GI: soft, non-tender; bowel sounds normal; no masses,  no organomegaly Extremities: extremities normal, atraumatic, no cyanosis or edema Neurologic: Alert and oriented X 3, normal strength and tone. Normal symmetric reflexes. Normal coordination and gait  ECOG PERFORMANCE STATUS: 1 - Symptomatic but completely ambulatory  Blood pressure 135/80, pulse 88, temperature 98 F (36.7 C), temperature source Temporal, resp. rate 17, height 5\' 9"  (1.753 m), weight 138 lb 12.8 oz (63 kg), SpO2 100%.  LABORATORY DATA: Lab Results  Component Value Date   WBC 4.3 09/26/2023   HGB 11.7 (L) 09/26/2023   HCT 35.4 (L) 09/26/2023   MCV 98.9 09/26/2023   PLT 216 09/26/2023      Chemistry      Component Value Date/Time   NA 139 09/26/2023 0917   K 3.7 09/26/2023 0917   CL 109 09/26/2023 0917   CO2 27 09/26/2023 0917   BUN 15 09/26/2023 0917   CREATININE 0.64 09/26/2023 0917   CREATININE 0.53 09/21/2022 0942      Component Value Date/Time    CALCIUM 9.4 09/26/2023 0917   ALKPHOS 62 09/26/2023 0917   AST 14 (L) 09/26/2023 0917   ALT 9 09/26/2023 0917   BILITOT 0.3 09/26/2023 0917       RADIOGRAPHIC STUDIES: CT CHEST ABDOMEN PELVIS W CONTRAST Result Date: 09/23/2023 CLINICAL DATA:  Lung cancer restaging * Tracking Code: BO * EXAM: CT CHEST, ABDOMEN, AND PELVIS WITH CONTRAST TECHNIQUE: Multidetector CT imaging of the chest, abdomen and pelvis was performed following the standard protocol during bolus administration of intravenous contrast. RADIATION DOSE REDUCTION: This exam was performed according to the departmental dose-optimization program which includes automated exposure control, adjustment of the mA and/or kV according to patient size and/or use of iterative reconstruction technique. CONTRAST:  OMNIPAQUE IOHEXOL 300 MG/ML  SOLN COMPARISON:  07/11/2023 FINDINGS: CT CHEST FINDINGS Cardiovascular: Left chest port catheter. Aortic atherosclerosis. Normal heart size. Three-vessel coronary artery calcifications. No pericardial effusion. Mediastinum/Nodes: Unchanged right hilar lymph node measuring 1.8 x 1.4 cm (series 2, image 29). No other enlarged mediastinal, hilar, or axillary lymph nodes. Thyroid gland, trachea, and esophagus demonstrate no significant findings. Lungs/Pleura: Moderate centrilobular and paraseptal emphysema. Mild diffuse bilateral bronchial wall thickening. Unchanged tiny nodule of the anterior right upper lobe measuring 0.3 cm (series 6, image 37). Clustered, nonspecific irregular ground-glass opacity in the inferior right upper  lobe (series 6, image 74). No pleural effusion or pneumothorax. Musculoskeletal: No chest wall abnormality. No acute osseous findings. CT ABDOMEN PELVIS FINDINGS Hepatobiliary: No solid liver abnormality is seen. Unchanged small cysts, benign, requiring no specific further follow-up or characterization contracted gallbladder. No gallstones, gallbladder wall thickening, or biliary  dilatation. Pancreas: Unremarkable. No pancreatic ductal dilatation or surrounding inflammatory changes. Spleen: Normal in size without significant abnormality. Adrenals/Urinary Tract: Adrenal glands are unremarkable. Kidneys are normal, without renal calculi, solid lesion, or hydronephrosis. Bladder is unremarkable. Stomach/Bowel: Stomach is within normal limits. Appendix appears normal. No evidence of bowel wall thickening, distention, or inflammatory changes. Vascular/Lymphatic: Aortic atherosclerosis. No enlarged abdominal or pelvic lymph nodes. Reproductive: No mass or other abnormality. Other: No abdominal wall hernia or abnormality. No ascites. Musculoskeletal: No acute osseous findings. IMPRESSION: 1. Unchanged enlarged right hilar lymph node. No other lymphadenopathy. 2. Unchanged tiny nodule of the anterior right upper lobe measuring 0.3 cm. 3. New small cluster of nonspecific infectious or inflammatory ground-glass opacity in the inferior right upper lobe. Attention on follow-up. 4. No evidence of lymphadenopathy or metastatic disease in the abdomen or pelvis. 5. Emphysema and diffuse bilateral bronchial wall thickening. 6. Coronary artery disease. Aortic Atherosclerosis (ICD10-I70.0) and Emphysema (ICD10-J43.9). Electronically Signed   By: Jearld Lesch M.D.   On: 09/23/2023 14:31    ASSESSMENT AND PLAN: This is a very pleasant 68 years old African-American female with Extensive stage small cell lung cancer (T3, N3, M1 C) . She presented with large right lower lobe lung masses in addition to bulky right hilar and mediastinal lymphadenopathy as well as suspicious lytic bone lesion at L1. She was diagnosed in March 2024.  The patient also has HIV diagnosed in March 2024 She is currently undergoing systemic chemotherapy with carboplatin for AUC of 4 on day 1 and etoposide 80 Mg/M2 on days 1, 2 and 3 with Imfinzi 1500 Mg on day 1 every 3 weeks with Neulasta support on day 5. First dose September 14, 2022 .  She is on dose reduced chemotherapy due to her HIV. Status post 14 cycles.  Starting cycle #5 her treatment is with maintenance immunotherapy with Imfinzi 1500 Mg IV every 4 weeks.  She has been tolerating her treatment fairly well. She had repeat CT scan of the chest, abdomen pelvis performed recently.  Her scan showed no concerning findings for disease progression.    Small cell lung cancer She has been undergoing treatment for small cell lung cancer since March 2024. She has completed four cycles of chemotherapy with carboplatin, etoposide, and durvalumab (Imfinzi) and is here for cycle fifteen of maintenance Durvalumab. Recent CT scans of the chest, abdomen, and pelvis show no concerning growths, indicating well-managed disease. There is mild pulmonary inflammation, likely due to environmental factors such as dust and potential mold exposure in her home. She reports no new symptoms or side effects from the treatment. - Proceed with cycle fifteen of durvalumab (Imfinzi). - Provide a copy of the recent CT scan to her.  HIV She is under the care of an HIV specialist. Her last test was negative, and she continues to adhere to her antiretroviral therapy as prescribed. There is no indication of any issues related to her HIV status at this time. - Continue current antiretroviral therapy.   The patient was advised to call immediately if she has any concerning symptoms in the interval. The patient voices understanding of current disease status and treatment options and is in agreement with the current care  plan.  All questions were answered. The patient knows to call the clinic with any problems, questions or concerns. We can certainly see the patient much sooner if necessary.  The total time spent in the appointment was 30 minutes.  Disclaimer: This note was dictated with voice recognition software. Similar sounding words can inadvertently be transcribed and may not be corrected upon review.

## 2023-09-27 ENCOUNTER — Other Ambulatory Visit: Payer: Self-pay

## 2023-10-04 ENCOUNTER — Other Ambulatory Visit: Payer: Self-pay

## 2023-10-04 DIAGNOSIS — R0600 Dyspnea, unspecified: Secondary | ICD-10-CM | POA: Diagnosis not present

## 2023-10-06 DIAGNOSIS — R0600 Dyspnea, unspecified: Secondary | ICD-10-CM | POA: Diagnosis not present

## 2023-10-19 ENCOUNTER — Other Ambulatory Visit: Payer: Self-pay

## 2023-10-19 NOTE — Progress Notes (Signed)
 [facility] OFFICE PROGRESS NOTE  Joan Bibber, DO [address] [place] [address]  DIAGNOSIS:  1) Extensive stage small cell lung cancer (T3, N3, M1 C) . She presented with large right lower lobe lung masses in addition to bulky right hilar and mediastinal lymphadenopathy as well as suspicious lytic bone lesion at L1. She was diagnosed in March 2024.  2) HIV diagnosed in March 2024 follows with Dr. Seymour Dapper from infectious disease  PRIOR THERAPY: None  CURRENT THERAPY: Systemic chemotherapy with carboplatin  for AUC of 4 on day 1 and etoposide  80 Mg/M2 on days 1, 2 and 3 with Imfinzi  1500 Mg on day 1 every 3 weeks with Neulasta  support on day 5. First dose September 14, 2022 . She is on dose reduced chemotherapy due to her HIV. Status post 15 cycles. Starting from cycle #5, she started single agent maintenance immunotherapy with Imfinzi  1500 mg IV every 4 weeks.   INTERVAL HISTORY: [name] 68 y.o. female returns to the clinic today for a follow-up visit. The patient is followed for history of small cell lung cancer. She is currently on maintenance immunotherapy with Imfinzi . She is feeling well today.   She tells me the only medication she is taking is her HIV medication. She is no longer taking plavix  once she ran out of refills.   Denies any shortness of breath. She denies significant cough (besides a "normal" cough). She denies fever, chills, night sweats, or unexplained weight loss.    She was on supplemental oxygen when she was first diagnosed with lung cancer but has been off supplemental oxygen since early in her diagnosis.  Denies any chest pain. Denies any nausea, vomiting, diarrhea, or constipation. 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  MEDICATIONS:  Current Outpatient Medications  Medication Sig Dispense Refill   amoxicillin  (AMOXIL ) 500 MG capsule Take 1 capsule (500 mg total) by mouth 3 (three) times daily. 21 capsule 0   clopidogrel  (PLAVIX ) 75 MG tablet Take 1 tablet (75 mg total) by mouth daily. 30 tablet 2   dolutegravir -lamiVUDine  (DOVATO ) 50-300 MG tablet Take 1 tablet by mouth daily. 30 tablet 11   HYDROcodone -acetaminophen  (NORCO/VICODIN) 5-325 MG tablet Take 1 tablet by mouth every 4-6 hours as needed for pain. 16 tablet 0   traMADol  (ULTRAM ) 50 MG tablet Take 1 tablet (50 mg total) by mouth every 6 (six) hours as needed for pain. 18 tablet 0   No current facility-administered medications for this visit.    SURGICAL HISTORY:  Past Surgical History:  Procedure Laterality Date   BRONCHIAL NEEDLE ASPIRATION BIOPSY  09/02/2022   Procedure: BRONCHIAL NEEDLE ASPIRATION BIOPSIES;  Surgeon: Prudy Brownie, DO;  [facility]: [facility];  Service: Cardiopulmonary;;   IR IMAGING GUIDED PORT INSERTION  09/29/2022   MULTIPLE TOOTH EXTRACTIONS     ORIF ANKLE FRACTURE  05/03/2012   Procedure: OPEN REDUCTION INTERNAL FIXATION (ORIF) ANKLE FRACTURE;  Surgeon: Amada Backer, MD;  Location: [facility] OR;  Service: Orthopedics;  Laterality: Left;   VIDEO BRONCHOSCOPY WITH ENDOBRONCHIAL ULTRASOUND Bilateral 09/02/2022  Procedure: VIDEO BRONCHOSCOPY WITH ENDOBRONCHIAL ULTRASOUND;  Surgeon: Prudy Brownie, DO;  Location: [facility];  Service: Cardiopulmonary;  Laterality: Bilateral;    REVIEW OF SYSTEMS:   Review of Systems  Constitutional: Negative for appetite change, chills, fatigue, fever and unexpected weight change.  HENT:  Negative for mouth sores, nosebleeds, sore throat and trouble swallowing.   Eyes: Negative for eye problems and icterus.  Respiratory: Negative for cough,  hemoptysis, shortness of breath and wheezing.   Cardiovascular: Negative for chest pain and leg swelling.  Gastrointestinal: Negative for abdominal pain, constipation, diarrhea, nausea and vomiting.  Genitourinary: Negative for bladder incontinence, difficulty urinating, dysuria, frequency and hematuria.   Musculoskeletal: Negative for back pain, gait problem, neck pain and neck stiffness.  Skin: Negative for itching and rash.  Neurological: Negative for dizziness, extremity weakness, gait problem, headaches, light-headedness and seizures.  Hematological: Negative for adenopathy. Does not bruise/bleed easily.  Psychiatric/Behavioral: Negative for confusion, depression and sleep disturbance. The patient is not nervous/anxious.     PHYSICAL EXAMINATION:  There were no vitals taken for this visit.  ECOG PERFORMANCE STATUS: 1  Physical Exam  Constitutional: Oriented to person, place, and time and well-developed, well-nourished, and in no distress.  HENT:  Head: Normocephalic and atraumatic.  Mouth/Throat: Oropharynx is clear and moist. No oropharyngeal exudate.  Eyes: Conjunctivae are normal. Right eye exhibits no discharge. Left eye exhibits no discharge. No scleral icterus.  Neck: Normal range of motion. Neck supple.  Cardiovascular: Normal rate, regular rhythm, normal heart sounds and intact distal pulses.   Pulmonary/Chest: Effort normal and breath sounds normal. No respiratory distress. No wheezes. No rales.  Abdominal: Soft. Bowel sounds are normal. Exhibits no distension and no mass. There is no tenderness.  Musculoskeletal: Normal range of motion. Exhibits no edema.  Lymphadenopathy:    No cervical adenopathy.  Neurological: Alert and oriented to person, place, and time. Exhibits normal muscle tone. Gait normal. Coordination normal.  Skin: Skin is warm and dry. No rash noted. Not diaphoretic. No erythema. No pallor.  Psychiatric: Mood, memory and judgment normal.  Vitals  reviewed.    LABORATORY DATA: Lab Results  Component Value Date   WBC 4.3 09/26/2023   HGB 11.7 (L) 09/26/2023   HCT 35.4 (L) 09/26/2023   MCV 98.9 09/26/2023   PLT 216 09/26/2023      Chemistry      Component Value Date/Time   NA 139 09/26/2023 0917   K 3.7 09/26/2023 0917   CL 109 09/26/2023 0917   CO2 27 09/26/2023 0917   BUN 15 09/26/2023 0917   CREATININE 0.64 09/26/2023 0917   CREATININE 0.53 09/21/2022 0942      Component Value Date/Time   CALCIUM 9.4 09/26/2023 0917   ALKPHOS 62 09/26/2023 0917   AST 14 (L) 09/26/2023 0917   ALT 9 09/26/2023 0917   BILITOT 0.3 09/26/2023 0917       RADIOGRAPHIC STUDIES:  CT CHEST ABDOMEN PELVIS W CONTRAST Result Date: 09/23/2023 CLINICAL DATA:  Lung cancer restaging * Tracking Code: BO * EXAM: CT CHEST, ABDOMEN, AND PELVIS WITH CONTRAST TECHNIQUE: Multidetector CT imaging of the chest, abdomen and pelvis was performed following the standard protocol during bolus administration of intravenous contrast. RADIATION DOSE REDUCTION: This exam was performed according to the departmental dose-optimization program which includes automated exposure control, adjustment of the mA and/or kV according to patient size and/or use of iterative reconstruction technique. CONTRAST:  OMNIPAQUE  IOHEXOL  300 MG/ML  SOLN COMPARISON:  07/11/2023 FINDINGS: CT CHEST  FINDINGS Cardiovascular: Left chest port catheter. Aortic atherosclerosis. Normal heart size. Three-vessel coronary artery calcifications. No pericardial effusion. Mediastinum/Nodes: Unchanged right hilar lymph node measuring 1.8 x 1.4 cm (series 2, image 29). No other enlarged mediastinal, hilar, or axillary lymph nodes. Thyroid  gland, trachea, and esophagus demonstrate no significant findings. Lungs/Pleura: Moderate centrilobular and paraseptal emphysema. Mild diffuse bilateral bronchial wall thickening. Unchanged tiny nodule of the anterior right upper lobe measuring 0.3 cm (series 6, image 37).  Clustered, nonspecific irregular ground-glass opacity in the inferior right upper lobe (series 6, image 74). No pleural effusion or pneumothorax. Musculoskeletal: No chest wall abnormality. No acute osseous findings. CT ABDOMEN PELVIS FINDINGS Hepatobiliary: No solid liver abnormality is seen. Unchanged small cysts, benign, requiring no specific further follow-up or characterization contracted gallbladder. No gallstones, gallbladder wall thickening, or biliary dilatation. Pancreas: Unremarkable. No pancreatic ductal dilatation or surrounding inflammatory changes. Spleen: Normal in size without significant abnormality. Adrenals/Urinary Tract: Adrenal glands are unremarkable. Kidneys are normal, without renal calculi, solid lesion, or hydronephrosis. Bladder is unremarkable. Stomach/Bowel: Stomach is within normal limits. Appendix appears normal. No evidence of bowel wall thickening, distention, or inflammatory changes. Vascular/Lymphatic: Aortic atherosclerosis. No enlarged abdominal or pelvic lymph nodes. Reproductive: No mass or other abnormality. Other: No abdominal wall hernia or abnormality. No ascites. Musculoskeletal: No acute osseous findings. IMPRESSION: 1. Unchanged enlarged right hilar lymph node. No other lymphadenopathy. 2. Unchanged tiny nodule of the anterior right upper lobe measuring 0.3 cm. 3. New small cluster of nonspecific infectious or inflammatory ground-glass opacity in the inferior right upper lobe. Attention on follow-up. 4. No evidence of lymphadenopathy or metastatic disease in the abdomen or pelvis. 5. Emphysema and diffuse bilateral bronchial wall thickening. 6. Coronary artery disease. Aortic Atherosclerosis (ICD10-I70.0) and Emphysema (ICD10-J43.9). Electronically Signed   By: Fredricka Jenny M.D.   On: 09/23/2023 14:31     ASSESSMENT/PLAN:  This is a very pleasant 68 year old female with extensive stage small cell lung cancer (T3, N3, M1 C) . She presented with large right lower  lobe lung masses in addition to bulky right hilar and mediastinal lymphadenopathy as well as suspicious lytic bone lesion at L1. She was diagnosed in March 2024.    She is currently on systemic chemotherapy with carboplatin  for AUC of 4 on day 1 and etoposide  80 Mg/M2 on days 1, 2 and 3 with Neulasta  support on day 5 as well as Imfinzi  1500 Mg IV on day 1 every 3 weeks. She had reduced dose of carboplatin  and etoposide  side because of her HIV status.  Starting with cycle #5, she started single agent maintenance immunotherapy with Imfinzi  1500 mg IV every 4 weeks she is status post 15 cycles and tolerated it well.    Labs were reviewed. Recommend she proceed with cycle #16 today as scheduled.   We will see her back for labs and follow up in 4 weeks for evaluation and repeat blood work before undergoing cycle #17.   I will reach out to Dr. Mark Sil to review the refill request for Plavix  and follow up if needed.   The patient was advised to call immediately if she has any concerning symptoms in the interval. The patient voices understanding of current disease status and treatment options and is in agreement with the current care plan. All questions were answered. The patient knows to call the clinic with any problems, questions or concerns. We can certainly see the patient much sooner if necessary  No orders of the defined types were placed in  this encounter.    The total time spent in the appointment was 20-29 minutes  Drayven Marchena L Hugo Lybrand, PA-C 10/19/23

## 2023-10-24 ENCOUNTER — Inpatient Hospital Stay

## 2023-10-24 ENCOUNTER — Other Ambulatory Visit: Payer: Self-pay | Admitting: Internal Medicine

## 2023-10-24 ENCOUNTER — Other Ambulatory Visit: Payer: Self-pay

## 2023-10-24 ENCOUNTER — Inpatient Hospital Stay (HOSPITAL_BASED_OUTPATIENT_CLINIC_OR_DEPARTMENT_OTHER): Admitting: Physician Assistant

## 2023-10-24 ENCOUNTER — Other Ambulatory Visit (HOSPITAL_COMMUNITY): Payer: Self-pay

## 2023-10-24 ENCOUNTER — Inpatient Hospital Stay: Attending: Internal Medicine

## 2023-10-24 VITALS — BP 108/69 | HR 93 | Temp 97.9°F | Resp 16 | Wt 141.1 lb

## 2023-10-24 DIAGNOSIS — C3490 Malignant neoplasm of unspecified part of unspecified bronchus or lung: Secondary | ICD-10-CM

## 2023-10-24 DIAGNOSIS — C3431 Malignant neoplasm of lower lobe, right bronchus or lung: Secondary | ICD-10-CM | POA: Diagnosis not present

## 2023-10-24 DIAGNOSIS — Z5112 Encounter for antineoplastic immunotherapy: Secondary | ICD-10-CM | POA: Insufficient documentation

## 2023-10-24 DIAGNOSIS — Z21 Asymptomatic human immunodeficiency virus [HIV] infection status: Secondary | ICD-10-CM | POA: Insufficient documentation

## 2023-10-24 DIAGNOSIS — M899 Disorder of bone, unspecified: Secondary | ICD-10-CM | POA: Insufficient documentation

## 2023-10-24 DIAGNOSIS — Z95828 Presence of other vascular implants and grafts: Secondary | ICD-10-CM

## 2023-10-24 LAB — CMP (CANCER CENTER ONLY)
ALT: 10 U/L (ref 0–44)
AST: 14 U/L — ABNORMAL LOW (ref 15–41)
Albumin: 4.1 g/dL (ref 3.5–5.0)
Alkaline Phosphatase: 66 U/L (ref 38–126)
Anion gap: 3 — ABNORMAL LOW (ref 5–15)
BUN: 12 mg/dL (ref 8–23)
CO2: 27 mmol/L (ref 22–32)
Calcium: 10 mg/dL (ref 8.9–10.3)
Chloride: 106 mmol/L (ref 98–111)
Creatinine: 0.67 mg/dL (ref 0.44–1.00)
GFR, Estimated: >60 mL/min (ref >60)
Glucose, Bld: 89 mg/dL (ref 70–99)
Potassium: 3.7 mmol/L (ref 3.5–5.1)
Sodium: 136 mmol/L (ref 135–145)
Total Bilirubin: 0.5 mg/dL (ref 0.0–1.2)
Total Protein: 8.2 g/dL — ABNORMAL HIGH (ref 6.5–8.1)

## 2023-10-24 LAB — CBC WITH DIFFERENTIAL (CANCER CENTER ONLY)
Abs Immature Granulocytes: 0.01 10*3/uL (ref 0.00–0.07)
Basophils Absolute: 0 10*3/uL (ref 0.0–0.1)
Basophils Relative: 1 %
Eosinophils Absolute: 0 10*3/uL (ref 0.0–0.5)
Eosinophils Relative: 1 %
HCT: 37.1 % (ref 36.0–46.0)
Hemoglobin: 12.2 g/dL (ref 12.0–15.0)
Immature Granulocytes: 0 %
Lymphocytes Relative: 24 %
Lymphs Abs: 1.3 10*3/uL (ref 0.7–4.0)
MCH: 32.2 pg (ref 26.0–34.0)
MCHC: 32.9 g/dL (ref 30.0–36.0)
MCV: 97.9 fL (ref 80.0–100.0)
Monocytes Absolute: 0.3 10*3/uL (ref 0.1–1.0)
Monocytes Relative: 6 %
Neutro Abs: 3.7 10*3/uL (ref 1.7–7.7)
Neutrophils Relative %: 68 %
Platelet Count: 212 10*3/uL (ref 150–400)
RBC: 3.79 MIL/uL — ABNORMAL LOW (ref 3.87–5.11)
RDW: 12.5 % (ref 11.5–15.5)
WBC Count: 5.4 10*3/uL (ref 4.0–10.5)
nRBC: 0 % (ref 0.0–0.2)

## 2023-10-24 MED ORDER — SODIUM CHLORIDE 0.9% FLUSH
10.0000 mL | Freq: Once | INTRAVENOUS | Status: AC
Start: 2023-10-24 — End: 2023-10-24
  Administered 2023-10-24: 10 mL

## 2023-10-24 MED ORDER — CLOPIDOGREL BISULFATE 75 MG PO TABS
75.0000 mg | ORAL_TABLET | Freq: Every day | ORAL | 5 refills | Status: DC
  Filled 2023-10-24: qty 30, 30d supply, fill #0
  Filled 2023-11-19: qty 30, 30d supply, fill #1
  Filled 2023-12-23: qty 30, 30d supply, fill #2
  Filled 2024-01-22: qty 30, 30d supply, fill #3
  Filled 2024-02-23: qty 30, 30d supply, fill #4
  Filled 2024-03-22: qty 30, 30d supply, fill #5

## 2023-10-24 MED ORDER — SODIUM CHLORIDE 0.9 % IV SOLN: Freq: Once | INTRAVENOUS | Status: AC

## 2023-10-24 MED ORDER — SODIUM CHLORIDE 0.9 % IV SOLN
1500.0000 mg | Freq: Once | INTRAVENOUS | Status: AC
  Administered 2023-10-24: 1500 mg via INTRAVENOUS
  Filled 2023-10-24: qty 30

## 2023-10-24 NOTE — Patient Instructions (Signed)
 [facility] - A DEPT OF [facility]  Discharge Instructions: Thank you for choosing [facility] to provide your oncology and hematology care.   If you have a lab appointment with the Cancer Center, please go directly to the Cancer Center and check in at the registration area.   Wear comfortable clothing and clothing appropriate for easy access to any Portacath or PICC line.   We strive to give you quality time with your provider. You may need to reschedule your appointment if you arrive late (15 or more minutes).  Arriving late affects you and other patients whose appointments are after yours.  Also, if you miss three or more appointments without notifying the office, you may be dismissed from the clinic at the provider's discretion.      For prescription refill requests, have your pharmacy contact our office and allow 72 hours for refills to be completed.    Today you received the following chemotherapy and/or immunotherapy agents: durvalumab (IMFINZI)       To help prevent nausea and vomiting after your treatment, we encourage you to take your nausea medication as directed.  BELOW ARE SYMPTOMS THAT SHOULD BE REPORTED IMMEDIATELY: *FEVER GREATER THAN 100.4 F (38 C) OR HIGHER *CHILLS OR SWEATING *NAUSEA AND VOMITING THAT IS NOT CONTROLLED WITH YOUR NAUSEA MEDICATION *UNUSUAL SHORTNESS OF BREATH *UNUSUAL BRUISING OR BLEEDING *URINARY PROBLEMS (pain or burning when urinating, or frequent urination) *BOWEL PROBLEMS (unusual diarrhea, constipation, pain near the anus) TENDERNESS IN MOUTH AND THROAT WITH OR WITHOUT PRESENCE OF ULCERS (sore throat, sores in mouth, or a toothache) UNUSUAL RASH, SWELLING OR PAIN  UNUSUAL VAGINAL DISCHARGE OR ITCHING   Items with * indicate a potential emergency and should be followed up as soon as possible or go to the Emergency Department if any problems should occur.  Please show the CHEMOTHERAPY ALERT CARD or  IMMUNOTHERAPY ALERT CARD at check-in to the Emergency Department and triage nurse.  Should you have questions after your visit or need to cancel or reschedule your appointment, please contact [facility] - A DEPT OF [facility]  Dept: [phone]  and follow the prompts.  Office hours are 8:00 a.m. to 4:30 p.m. Monday - Friday. Please note that voicemails left after 4:00 p.m. may not be returned until the following business day.  We are closed weekends and major holidays. You have access to a nurse at all times for urgent questions. Please call the main number to the clinic Dept: [phone] and follow the prompts.   For any non-urgent questions, you may also contact your provider using MyChart. We now offer e-Visits for anyone 39 and older to request care online for non-urgent symptoms. For details visit [email or web address].   Also download the MyChart app! Go to the app store, search "MyChart", open the app, select [facility], and log in with your MyChart username and password.

## 2023-10-25 ENCOUNTER — Telehealth: Payer: Self-pay

## 2023-10-25 NOTE — Telephone Encounter (Signed)
 Attempted to reach patient regarding call to the after-hours nurse triage line. Patient reported numbness and tingling in the left arm, hand, and right leg. The triage nurse advised the patient to go to the ER, but the patient refused and stated she had an appointment scheduled for today and would discuss the symptoms at that visit.  Left voicemail for patient requesting a call back to update on how she is feeling today.

## 2023-10-31 ENCOUNTER — Encounter: Payer: Self-pay | Admitting: Internal Medicine

## 2023-10-31 ENCOUNTER — Other Ambulatory Visit: Payer: Self-pay

## 2023-10-31 ENCOUNTER — Encounter: Payer: Self-pay | Admitting: Physician Assistant

## 2023-10-31 ENCOUNTER — Other Ambulatory Visit (HOSPITAL_COMMUNITY): Payer: Self-pay

## 2023-10-31 ENCOUNTER — Other Ambulatory Visit: Payer: Self-pay | Admitting: Pharmacy Technician

## 2023-10-31 NOTE — Progress Notes (Signed)
 Specialty Pharmacy Refill Coordination Note  [name] is a 69 y.o. female contacted today regarding refills of specialty medication(s) Dolutegravir -lamiVUDine  (DOVATO )   Patient requested Delivery   Delivery date: 11/01/23   Verified address: [address]  [place]   Medication will be filled on 10/31/23.

## 2023-11-03 DIAGNOSIS — R0600 Dyspnea, unspecified: Secondary | ICD-10-CM | POA: Diagnosis not present

## 2023-11-05 DIAGNOSIS — R0600 Dyspnea, unspecified: Secondary | ICD-10-CM | POA: Diagnosis not present

## 2023-11-14 ENCOUNTER — Telehealth: Payer: Self-pay

## 2023-11-14 NOTE — Telephone Encounter (Signed)
 Received voicemail from patient stating she discovered mold in her apartment yesterday and now needs to find alternative housing. Patient is inquiring about resources or emergency housing options. Message will be forwarded to Social Worker, Amalia Badder, for review.

## 2023-11-20 ENCOUNTER — Other Ambulatory Visit (HOSPITAL_COMMUNITY): Payer: Self-pay

## 2023-11-21 ENCOUNTER — Inpatient Hospital Stay

## 2023-11-21 ENCOUNTER — Inpatient Hospital Stay (HOSPITAL_BASED_OUTPATIENT_CLINIC_OR_DEPARTMENT_OTHER): Admitting: Internal Medicine

## 2023-11-21 ENCOUNTER — Inpatient Hospital Stay: Attending: Internal Medicine

## 2023-11-21 ENCOUNTER — Inpatient Hospital Stay: Admitting: Licensed Clinical Social Worker

## 2023-11-21 VITALS — BP 133/82 | HR 85 | Temp 97.6°F | Resp 19 | Wt 134.2 lb

## 2023-11-21 DIAGNOSIS — Z95828 Presence of other vascular implants and grafts: Secondary | ICD-10-CM

## 2023-11-21 DIAGNOSIS — C3490 Malignant neoplasm of unspecified part of unspecified bronchus or lung: Secondary | ICD-10-CM | POA: Diagnosis not present

## 2023-11-21 DIAGNOSIS — Z21 Asymptomatic human immunodeficiency virus [HIV] infection status: Secondary | ICD-10-CM | POA: Insufficient documentation

## 2023-11-21 DIAGNOSIS — C3431 Malignant neoplasm of lower lobe, right bronchus or lung: Secondary | ICD-10-CM

## 2023-11-21 DIAGNOSIS — M899 Disorder of bone, unspecified: Secondary | ICD-10-CM | POA: Insufficient documentation

## 2023-11-21 DIAGNOSIS — Z5112 Encounter for antineoplastic immunotherapy: Secondary | ICD-10-CM | POA: Diagnosis not present

## 2023-11-21 LAB — CBC WITH DIFFERENTIAL (CANCER CENTER ONLY)
Abs Immature Granulocytes: 0.01 10*3/uL (ref 0.00–0.07)
Basophils Absolute: 0 10*3/uL (ref 0.0–0.1)
Basophils Relative: 1 %
Eosinophils Absolute: 0.1 10*3/uL (ref 0.0–0.5)
Eosinophils Relative: 3 %
HCT: 38.5 % (ref 36.0–46.0)
Hemoglobin: 13.1 g/dL (ref 12.0–15.0)
Immature Granulocytes: 0 %
Lymphocytes Relative: 35 %
Lymphs Abs: 1.4 10*3/uL (ref 0.7–4.0)
MCH: 33.2 pg (ref 26.0–34.0)
MCHC: 34 g/dL (ref 30.0–36.0)
MCV: 97.5 fL (ref 80.0–100.0)
Monocytes Absolute: 0.3 10*3/uL (ref 0.1–1.0)
Monocytes Relative: 8 %
Neutro Abs: 2.1 10*3/uL (ref 1.7–7.7)
Neutrophils Relative %: 53 %
Platelet Count: 216 10*3/uL (ref 150–400)
RBC: 3.95 MIL/uL (ref 3.87–5.11)
RDW: 13 % (ref 11.5–15.5)
WBC Count: 4 10*3/uL (ref 4.0–10.5)
nRBC: 0 % (ref 0.0–0.2)

## 2023-11-21 LAB — CMP (CANCER CENTER ONLY)
ALT: 10 U/L (ref 0–44)
AST: 15 U/L (ref 15–41)
Albumin: 4 g/dL (ref 3.5–5.0)
Alkaline Phosphatase: 74 U/L (ref 38–126)
Anion gap: 4 — ABNORMAL LOW (ref 5–15)
BUN: 8 mg/dL (ref 8–23)
CO2: 27 mmol/L (ref 22–32)
Calcium: 9.5 mg/dL (ref 8.9–10.3)
Chloride: 110 mmol/L (ref 98–111)
Creatinine: 0.69 mg/dL (ref 0.44–1.00)
GFR, Estimated: >60 mL/min (ref >60)
Glucose, Bld: 102 mg/dL — ABNORMAL HIGH (ref 70–99)
Potassium: 3.7 mmol/L (ref 3.5–5.1)
Sodium: 141 mmol/L (ref 135–145)
Total Bilirubin: 0.3 mg/dL (ref 0.0–1.2)
Total Protein: 8.1 g/dL (ref 6.5–8.1)

## 2023-11-21 MED ORDER — SODIUM CHLORIDE 0.9% FLUSH
10.0000 mL | INTRAVENOUS | Status: DC | PRN
Start: 2023-11-21 — End: 2023-11-21
  Administered 2023-11-21: 10 mL

## 2023-11-21 MED ORDER — SODIUM CHLORIDE 0.9 % IV SOLN: Freq: Once | INTRAVENOUS | Status: AC

## 2023-11-21 MED ORDER — SODIUM CHLORIDE 0.9% FLUSH
10.0000 mL | Freq: Once | INTRAVENOUS | Status: AC
  Administered 2023-11-21: 10 mL

## 2023-11-21 MED ORDER — HEPARIN SOD (PORK) LOCK FLUSH 100 UNIT/ML IV SOLN
500.0000 [IU] | Freq: Once | INTRAVENOUS | Status: AC | PRN
  Administered 2023-11-21: 500 [IU]

## 2023-11-21 MED ORDER — SODIUM CHLORIDE 0.9 % IV SOLN
1500.0000 mg | Freq: Once | INTRAVENOUS | Status: AC
  Administered 2023-11-21: 1500 mg via INTRAVENOUS
  Filled 2023-11-21: qty 30

## 2023-11-21 NOTE — Progress Notes (Signed)
 [facility] CSW Progress Note  Visual merchandiser met with patient in infusion to discuss housing issues.  Pt reporting mold in her apartment.  Per pt she is on an emergency housing list to be moved, but as of yet she has not heard anything.  CSW encouraged pt to continue to follow up as securing housing is challenging at this time.  CSW offered to provide pt w/ a letter from the cancer center confirming diagnosis and seriousness of her condition if it will assist w/ getting pt into a new apartment quicker.  Pt will check to see if a letter will be beneficial and will contact CSW if needed.  CSW to continue to provide support as appropriate throughout duration of treatment.      Quenton Bruns, LCSW Clinical Social Worker [facility]

## 2023-11-21 NOTE — Patient Instructions (Signed)
 [facility] - A DEPT OF [facility]  Discharge Instructions: Thank you for choosing [facility] to provide your oncology and hematology care.   If you have a lab appointment with the Cancer Center, please go directly to the Cancer Center and check in at the registration area.   Wear comfortable clothing and clothing appropriate for easy access to any Portacath or PICC line.   We strive to give you quality time with your provider. You may need to reschedule your appointment if you arrive late (15 or more minutes).  Arriving late affects you and other patients whose appointments are after yours.  Also, if you miss three or more appointments without notifying the office, you may be dismissed from the clinic at the provider's discretion.      For prescription refill requests, have your pharmacy contact our office and allow 72 hours for refills to be completed.    Today you received the following chemotherapy and/or immunotherapy agents :  durvalumab      To help prevent nausea and vomiting after your treatment, we encourage you to take your nausea medication as directed.  BELOW ARE SYMPTOMS THAT SHOULD BE REPORTED IMMEDIATELY: *FEVER GREATER THAN 100.4 F (38 C) OR HIGHER *CHILLS OR SWEATING *NAUSEA AND VOMITING THAT IS NOT CONTROLLED WITH YOUR NAUSEA MEDICATION *UNUSUAL SHORTNESS OF BREATH *UNUSUAL BRUISING OR BLEEDING *URINARY PROBLEMS (pain or burning when urinating, or frequent urination) *BOWEL PROBLEMS (unusual diarrhea, constipation, pain near the anus) TENDERNESS IN MOUTH AND THROAT WITH OR WITHOUT PRESENCE OF ULCERS (sore throat, sores in mouth, or a toothache) UNUSUAL RASH, SWELLING OR PAIN  UNUSUAL VAGINAL DISCHARGE OR ITCHING   Items with * indicate a potential emergency and should be followed up as soon as possible or go to the Emergency Department if any problems should occur.  Please show the CHEMOTHERAPY ALERT CARD or  IMMUNOTHERAPY ALERT CARD at check-in to the Emergency Department and triage nurse.  Should you have questions after your visit or need to cancel or reschedule your appointment, please contact [facility] - A DEPT [facility]  Dept: [phone]  and follow the prompts.  Office hours are 8:00 a.m. to 4:30 p.m. Monday - Friday. Please note that voicemails left after 4:00 p.m. may not be returned until the following business day.  We are closed weekends and major holidays. You have access to a nurse at all times for urgent questions. Please call the main number to the clinic Dept: [phone] and follow the prompts.   For any non-urgent questions, you may also contact your provider using MyChart. We now offer e-Visits for anyone 30 and older to request care online for non-urgent symptoms. For details visit [email or web address].   Also download the MyChart app! Go to the app store, search "MyChart", open the app, select [place], and log in with your MyChart username and password.

## 2023-11-21 NOTE — Progress Notes (Signed)
 [facility] Telephone:([phone]   [phone]  OFFICE PROGRESS NOTE  [name], DO [address] [place] [address]  DIAGNOSIS:  1) Extensive stage small cell lung cancer (T3, N3, M1 C) . She presented with large right lower lobe lung masses in addition to bulky right hilar and mediastinal lymphadenopathy as well as suspicious lytic bone lesion at L1. She was diagnosed in March 2024.  2) HIV diagnosed in March 2024   PRIOR THERAPY: None   CURRENT THERAPY: Systemic chemotherapy with carboplatin  for AUC of 4 on day 1 and etoposide  80 Mg/M2 on days 1, 2 and 3 with Imfinzi  1500 Mg on day 1 every 3 weeks with Neulasta  support on day 5. First dose September 14, 2022 . She is on dose reduced chemotherapy due to her HIV. Status post 16 cycles.  Starting from cycle #5 her treatment with with maintenance immunotherapy with Imfinzi  1500 Mg IV every 4 weeks  INTERVAL HISTORY: [name] 68 y.o. female returns to the clinic today for follow-up visit. Discussed the use of AI scribe software for clinical note transcription with the patient, who gave verbal consent to proceed.  History of Present Illness   [name] is a 68 year old female with small cell lung cancer who presents for evaluation before starting cycle seventeen of maintenance treatment.  She has been undergoing treatment for small cell lung cancer since March 2024. Initially, she received chemotherapy with carboplatin , etoposide , and Imfinzi  for four cycles, followed by maintenance treatment with Imfinzi  every four weeks. She has completed a total of sixteen cycles.  She reports no issues with the current treatment regimen, including no rash, diarrhea, nausea, or vomiting. However, she has experienced a slight weight loss, decreasing from 141 pounds in May to 134 pounds currently. She attributes this weight change to stress related to mold in her apartment, which has necessitated her looking for a  new place to live.       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  MEDICATIONS:  Current Outpatient Medications  Medication Sig Dispense Refill   clopidogrel  (PLAVIX ) 75 MG tablet Take 1 tablet (75 mg total) by mouth daily. 30 tablet 5   dolutegravir -lamiVUDine  (DOVATO ) 50-300 MG tablet Take 1 tablet by mouth daily. 30 tablet 11   No current facility-administered medications for this visit.    SURGICAL HISTORY:  Past Surgical History:  Procedure Laterality Date   BRONCHIAL NEEDLE ASPIRATION BIOPSY  09/02/2022   Procedure: BRONCHIAL NEEDLE ASPIRATION BIOPSIES;  Surgeon: Prudy Brownie, DO;  [facility]: [facility];  Service: Cardiopulmonary;;   IR IMAGING GUIDED PORT INSERTION  09/29/2022   MULTIPLE TOOTH EXTRACTIONS     ORIF ANKLE FRACTURE  05/03/2012   Procedure: OPEN REDUCTION INTERNAL FIXATION (ORIF) ANKLE FRACTURE;  Surgeon: Amada Backer, MD;  Location: [facility];  Service: Orthopedics;  Laterality: Left;   VIDEO BRONCHOSCOPY WITH ENDOBRONCHIAL ULTRASOUND Bilateral 09/02/2022   Procedure: VIDEO BRONCHOSCOPY WITH ENDOBRONCHIAL ULTRASOUND;  Surgeon: Prudy Brownie, DO;  Location: [facility];  Service: Cardiopulmonary;  Laterality: Bilateral;    REVIEW OF SYSTEMS:  A comprehensive review of systems was negative except  for: Constitutional: positive for weight loss   PHYSICAL EXAMINATION: General appearance: alert, cooperative, and no distress Head: Normocephalic, without obvious abnormality, atraumatic Neck: no adenopathy, no JVD, supple, symmetrical, trachea midline, and thyroid  not enlarged, symmetric, no tenderness/mass/nodules Lymph nodes: Cervical, supraclavicular, and axillary nodes normal. Resp: clear to  auscultation bilaterally Back: symmetric, no curvature. ROM normal. No CVA tenderness. Cardio: regular rate and rhythm, S1, S2 normal, no murmur, click, rub or gallop GI: soft, non-tender; bowel sounds normal; no masses,  no organomegaly Extremities: extremities normal, atraumatic, no cyanosis or edema  ECOG PERFORMANCE STATUS: 1 - Symptomatic but completely ambulatory  Blood pressure 133/82, pulse 85, temperature 97.6 F (36.4 C), temperature source Temporal, resp. rate 19, weight 134 lb 3.2 oz (60.9 kg), SpO2 98%.  LABORATORY DATA: Lab Results  Component Value Date   WBC 4.0 11/21/2023   HGB 13.1 11/21/2023   HCT 38.5 11/21/2023   MCV 97.5 11/21/2023   PLT 216 11/21/2023      Chemistry      Component Value Date/Time   NA 141 11/21/2023 1038   K 3.7 11/21/2023 1038   CL 110 11/21/2023 1038   CO2 27 11/21/2023 1038   BUN 8 11/21/2023 1038   CREATININE 0.69 11/21/2023 1038   CREATININE 0.53 09/21/2022 0942      Component Value Date/Time   CALCIUM 9.5 11/21/2023 1038   ALKPHOS 74 11/21/2023 1038   AST 15 11/21/2023 1038   ALT 10 11/21/2023 1038   BILITOT 0.3 11/21/2023 1038       RADIOGRAPHIC STUDIES: No results found.   ASSESSMENT AND PLAN: This is a very pleasant 68 years old African-American female with Extensive stage small cell lung cancer (T3, N3, M1 C) . She presented with large right lower lobe lung masses in addition to bulky right hilar and mediastinal lymphadenopathy as well as suspicious lytic bone lesion at L1. She was diagnosed in March 2024.  The patient also has HIV diagnosed in March 2024 She is currently undergoing systemic chemotherapy with carboplatin  for AUC of 4 on day 1 and etoposide  80 Mg/M2 on days 1, 2 and 3 with Imfinzi  1500 Mg on day 1 every 3 weeks with Neulasta  support on day 5. First dose September 14, 2022 . She is on dose reduced chemotherapy due to her HIV. Status post 16 cycles.  Starting cycle #5 her treatment is with maintenance  immunotherapy with Imfinzi  1500 Mg IV every 4 weeks.  She has been tolerating her treatment fairly well. Assessment and Plan    Small cell lung cancer [name], a 68 year old female, was diagnosed with small cell lung cancer in March 2024. She completed initial chemotherapy with carboplatin , etoposide , and durvalumab  (Imfinzi ) for four cycles, followed by maintenance therapy with Imfinzi  every four weeks. She has completed sixteen cycles and is here for evaluation before cycle seventeen. She reports no treatment-related issues, including no rash, diarrhea, nausea, or vomiting. Weight decreased from 141 pounds in May to 134 pounds, attributed to stress from mold issues in her apartment. Lab work is satisfactory for treatment continuation. - Proceed with cycle seventeen of maintenance treatment with Imfinzi . - Order a scan in three weeks to evaluate cancer status. - Schedule follow-up appointment in four weeks post-scan.   The patient was advised to call immediately if she has any concerning symptoms in the interval. The patient voices understanding of current disease status and treatment options and is in agreement with the current care plan.  All questions were answered. The patient  knows to call the clinic with any problems, questions or concerns. We can certainly see the patient much sooner if necessary.  The total time spent in the appointment was 20 minutes.  Disclaimer: This note was dictated with voice recognition software. Similar sounding words can inadvertently be transcribed and may not be corrected upon review.

## 2023-11-22 ENCOUNTER — Other Ambulatory Visit: Payer: Self-pay

## 2023-11-24 ENCOUNTER — Other Ambulatory Visit: Payer: Self-pay | Admitting: Pharmacist

## 2023-11-24 ENCOUNTER — Other Ambulatory Visit: Payer: Self-pay

## 2023-11-24 NOTE — Progress Notes (Signed)
 Specialty Pharmacy Ongoing Clinical Assessment Note  [name] is a 68 y.o. female who is being followed by the specialty pharmacy service for RxSp HIV   Patient's specialty medication(s) reviewed today: Dolutegravir -lamiVUDine  (DOVATO )   Missed doses in the last 4 weeks: 0   Patient/Caregiver did not have any additional questions or concerns.   Therapeutic benefit summary: Patient is achieving benefit   Adverse events/side effects summary: No adverse events/side effects   Patient's therapy is appropriate to: Continue    Goals Addressed             This Visit's Progress    Achieve Undetectable HIV Viral Load < 20   On track    Patient is on track. Patient will maintain adherence. Viral load remains undetectable since 11/28/22.       Comply with lab assessments       Patient is on track. Patient will adhere to provider and/or lab appointments.      Maintain optimal adherence to therapy       Patient is on track. Patient will maintain adherence.         Follow up: 1 year  [name], PharmD, BCIDP, AAHIVP, CPP Infectious Diseases Clinical Pharmacist Practitioner Clinical Pharmacist Lead, Specialty Pharmacy [facility] 11/24/2023, 10:02 AM

## 2023-11-27 ENCOUNTER — Other Ambulatory Visit: Payer: Self-pay

## 2023-11-28 ENCOUNTER — Encounter: Payer: Self-pay | Admitting: Physician Assistant

## 2023-11-28 ENCOUNTER — Encounter: Payer: Self-pay | Admitting: Internal Medicine

## 2023-11-29 ENCOUNTER — Other Ambulatory Visit: Payer: Self-pay

## 2023-12-04 ENCOUNTER — Other Ambulatory Visit: Payer: Self-pay

## 2023-12-04 ENCOUNTER — Other Ambulatory Visit: Payer: Self-pay | Admitting: Pharmacy Technician

## 2023-12-04 DIAGNOSIS — R0600 Dyspnea, unspecified: Secondary | ICD-10-CM | POA: Diagnosis not present

## 2023-12-04 NOTE — Progress Notes (Signed)
 Specialty Pharmacy Refill Coordination Note  [name] is a 68 y.o. female contacted today regarding refills of specialty medication(s) Dolutegravir -lamiVUDine  (DOVATO )   Patient requested Delivery   Delivery date: 12/05/23   Verified address: [address] [name] [address]   Medication will be filled on 12/04/23.

## 2023-12-06 DIAGNOSIS — R0600 Dyspnea, unspecified: Secondary | ICD-10-CM | POA: Diagnosis not present

## 2023-12-11 ENCOUNTER — Ambulatory Visit (HOSPITAL_COMMUNITY)

## 2023-12-16 ENCOUNTER — Ambulatory Visit (HOSPITAL_BASED_OUTPATIENT_CLINIC_OR_DEPARTMENT_OTHER)
Admission: RE | Admit: 2023-12-16 | Discharge: 2023-12-16 | Disposition: A | Discharge status: Home / Self Care | Source: Ambulatory Visit | Attending: Internal Medicine | Admitting: Internal Medicine

## 2023-12-16 DIAGNOSIS — C3490 Malignant neoplasm of unspecified part of unspecified bronchus or lung: Secondary | ICD-10-CM | POA: Insufficient documentation

## 2023-12-16 DIAGNOSIS — I700 Atherosclerosis of aorta: Secondary | ICD-10-CM | POA: Diagnosis not present

## 2023-12-16 DIAGNOSIS — J439 Emphysema, unspecified: Secondary | ICD-10-CM | POA: Diagnosis not present

## 2023-12-16 DIAGNOSIS — N3289 Other specified disorders of bladder: Secondary | ICD-10-CM | POA: Diagnosis not present

## 2023-12-16 MED ORDER — IOHEXOL 300 MG/ML  SOLN
80.0000 mL | Freq: Once | INTRAMUSCULAR | Status: AC | PRN
  Administered 2023-12-16: 80 mL via INTRAVENOUS

## 2023-12-16 MED ORDER — HEPARIN SOD (PORK) LOCK FLUSH 100 UNIT/ML IV SOLN
500.0000 [IU] | Freq: Once | INTRAVENOUS | Status: AC
  Administered 2023-12-16: 500 [IU] via INTRAVENOUS

## 2023-12-18 ENCOUNTER — Telehealth: Payer: Self-pay | Admitting: Internal Medicine

## 2023-12-18 ENCOUNTER — Inpatient Hospital Stay: Attending: Internal Medicine | Admitting: Physician Assistant

## 2023-12-18 DIAGNOSIS — M899 Disorder of bone, unspecified: Secondary | ICD-10-CM | POA: Insufficient documentation

## 2023-12-18 DIAGNOSIS — C3431 Malignant neoplasm of lower lobe, right bronchus or lung: Secondary | ICD-10-CM | POA: Insufficient documentation

## 2023-12-18 DIAGNOSIS — Z5112 Encounter for antineoplastic immunotherapy: Secondary | ICD-10-CM | POA: Diagnosis not present

## 2023-12-18 DIAGNOSIS — C3490 Malignant neoplasm of unspecified part of unspecified bronchus or lung: Secondary | ICD-10-CM | POA: Diagnosis not present

## 2023-12-18 DIAGNOSIS — Z21 Asymptomatic human immunodeficiency virus [HIV] infection status: Secondary | ICD-10-CM | POA: Insufficient documentation

## 2023-12-18 DIAGNOSIS — N3289 Other specified disorders of bladder: Secondary | ICD-10-CM | POA: Insufficient documentation

## 2023-12-18 NOTE — Telephone Encounter (Signed)
 Left the patient a voicemail to please call us  back due to provider not being in the office this week.

## 2023-12-18 NOTE — Progress Notes (Signed)
 [facility] HEMATOLOGY-ONCOLOGY TeleHEALTH VISIT PROGRESS NOTE   I connected with [name] Chin on 12/18/23 at  3:30 PM EDT by telephone and verified that I am speaking with the correct person using two identifiers.  I discussed the limitations, risks, security and privacy concerns of performing an evaluation and management service by telemedicine and the availability of in-person appointments. I also discussed with the patient that there may be a patient responsible charge related to this service. The patient expressed understanding and agreed to proceed.  Other persons participating in the visit and their role in the encounter: None  Patient's location: Home  Provider's location: Office  [name], DO [address] 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 She presented with large right lower lobe lung masses in addition to bulky right hilar and mediastinal lymphadenopathy as well as suspicious lytic bone lesion at L1. She was diagnosed in March 2024.  2) HIV diagnosed in March 2024 follows with Dr. Efrain from infectious disease  PRIOR THERAPY: None  CURRENT THERAPY: Systemic chemotherapy with carboplatin  for AUC of 4 on day 1 and etoposide  80 Mg/M2 on days 1, 2 and 3 with Imfinzi  1500 Mg on day 1 every 3 weeks with Neulasta  support on day 5. First dose September 14, 2022 . She is on dose reduced chemotherapy due to her HIV. Status post 17 cycles. Starting from cycle #5, she started single agent maintenance immunotherapy with Imfinzi  1500 mg IV every 4 weeks.   INTERVAL HISTORY: [name] 68 y.o. female and I connected via telephone call today.  The patient was last seen in the clinic 1 month ago by Dr. Sherrod.  She is currently undergoing single agent maintenance immunotherapy with Imfinzi .  She is tolerating treatment well without any adverse side effects.  She denies any concerning complaints today.  She denies any  fever, chills, night sweats, or unexplained weight loss.  She denies any changes in her breathing or significant cough besides her baseline cough.  She denies any chest pain or hemoptysis.  She denies any nausea, vomiting, diarrhea, or constipation.  Her scan mentions some bladder thickening she denies any dysuria, malodorous urine, or heat or hematuria denies any headache or visual changes.  She denies any rashes or skin changes.  She recently had a restaging CT scan.  Her visit today is for toxicity check prior to undergoing cycle #18 scheduled for tomorrow.  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  MEDICATIONS:  Current Outpatient Medications  Medication Sig Dispense Refill   clopidogrel  (PLAVIX ) 75 MG tablet Take 1 tablet (75 mg total) by mouth daily. 30 tablet 5   dolutegravir -lamiVUDine  (DOVATO ) 50-300 MG tablet Take 1 tablet by mouth daily. 30 tablet 11   No current facility-administered medications for this visit.    SURGICAL HISTORY:  Past Surgical History:  Procedure Laterality Date   BRONCHIAL NEEDLE ASPIRATION BIOPSY  09/02/2022   Procedure: BRONCHIAL NEEDLE ASPIRATION BIOPSIES;  Surgeon: Brenna Adine CROME, DO;  Location: [facility] ENDOSCOPY;  Service: Cardiopulmonary;;   IR IMAGING GUIDED PORT INSERTION  09/29/2022   MULTIPLE TOOTH EXTRACTIONS  ORIF ANKLE FRACTURE  05/03/2012   Procedure: OPEN REDUCTION INTERNAL FIXATION (ORIF) ANKLE FRACTURE;  Surgeon: Norleen Armor, MD;  Location: [facility] OR;  Service: Orthopedics;  Laterality: Left;   VIDEO BRONCHOSCOPY WITH ENDOBRONCHIAL ULTRASOUND Bilateral 09/02/2022   Procedure: VIDEO BRONCHOSCOPY WITH ENDOBRONCHIAL ULTRASOUND;  Surgeon: Brenna Adine CROME, DO;  Location: [facility]  ENDOSCOPY;  Service: Cardiopulmonary;  Laterality: Bilateral;    REVIEW OF SYSTEMS:   Review of Systems  Constitutional: Negative for appetite change, chills, fatigue, fever and unexpected weight change.  HENT:  Negative for mouth sores, nosebleeds, sore throat and trouble swallowing.   Eyes: Negative for eye problems and icterus.  Respiratory: Negative for cough, hemoptysis, shortness of breath and wheezing.   Cardiovascular: Negative for chest pain and leg swelling.  Gastrointestinal: Negative for abdominal pain, constipation, diarrhea, nausea and vomiting.  Genitourinary: Negative for bladder incontinence, difficulty urinating, dysuria, frequency and hematuria.   Musculoskeletal: Negative for back pain, gait problem, neck pain and neck stiffness.  Skin: Negative for itching and rash.  Neurological: Negative for dizziness, extremity weakness, gait problem, headaches, light-headedness and seizures.  Hematological: Negative for adenopathy. Does not bruise/bleed easily.  Psychiatric/Behavioral: Negative for confusion, depression and sleep disturbance. The patient is not nervous/anxious.    PHYSICAL EXAMINATION:  There were no vitals taken for this visit.  ECOG PERFORMANCE STATUS: 1  Physical Exam  Constitutional: Oriented to person, place, and time  Psychiatric: Mood, memory and judgment normal.  Vitals reviewed.  LABORATORY DATA: Lab Results  Component Value Date   WBC 4.0 11/21/2023   HGB 13.1 11/21/2023   HCT 38.5 11/21/2023   MCV 97.5 11/21/2023   PLT 216 11/21/2023      Chemistry      Component Value Date/Time   NA 141 11/21/2023 1038   K 3.7 11/21/2023 1038   CL 110 11/21/2023 1038   CO2 27 11/21/2023 1038   BUN 8 11/21/2023 1038   CREATININE 0.69 11/21/2023 1038   CREATININE 0.53 09/21/2022 0942      Component Value Date/Time   CALCIUM 9.5 11/21/2023 1038   ALKPHOS 74 11/21/2023 1038   AST 15 11/21/2023 1038   ALT 10 11/21/2023 1038   BILITOT 0.3 11/21/2023  1038       RADIOGRAPHIC STUDIES:  CT Chest W Contrast Result Date: 12/17/2023 CLINICAL DATA:  Non-small cell lung cancer, follow-up. * Tracking Code: BO * EXAM: CT CHEST, ABDOMEN, AND PELVIS WITH CONTRAST TECHNIQUE: Multidetector CT imaging of the chest, abdomen and pelvis was performed following the standard protocol during bolus administration of intravenous contrast. RADIATION DOSE REDUCTION: This exam was performed according to the departmental dose-optimization program which includes automated exposure control, adjustment of the mA and/or kV according to patient size and/or use of iterative reconstruction technique. CONTRAST:  80mL OMNIPAQUE  IOHEXOL  300 MG/ML  SOLN COMPARISON:  Multiple priors including CT September 21, 2023 FINDINGS: CT CHEST FINDINGS Cardiovascular: Accessed left chest Port-A-Cath with tip in the right atrium. Aortic atherosclerosis. Coronary artery calcifications. Normal size heart. No significant pericardial effusion/thickening. Mediastinum/Nodes: Right hilar lymph node measures 15 x 10 mm on image 74/2 previously 18 x 14 mm. No other pathologically enlarged mediastinal, hilar or axillary lymph nodes identified. The esophagus is grossly unremarkable. No suspicious thyroid  nodule. Lungs/Pleura: Emphysema. Mild diffuse bronchial wall thickening. Scattered atelectasis/scarring. Stable 3 mm right upper lobe pulmonary nodule on image 42/3. Resolved clustered nodularity in the right upper lobe. No new suspicious pulmonary nodules or masses. Musculoskeletal: No aggressive lytic or blastic lesion of bone.  Multilevel degenerative changes spine. CT ABDOMEN PELVIS FINDINGS Hepatobiliary: Stable tiny bilobar hepatic cysts. No solid enhancing hepatic lesion. Gallbladder is unremarkable. No biliary ductal dilation. Pancreas: No pancreatic ductal dilation or evidence of acute inflammation. Spleen: No splenomegaly. Adrenals/Urinary Tract: Bilateral adrenal glands appear normal. No hydronephrosis.  Kidneys demonstrate symmetric enhancement. Symmetric wall thickening of a nondistended urinary bladder. Stomach/Bowel: Stomach is unremarkable for degree of distension. No pathologic dilation of small or large bowel. No evidence of acute bowel inflammation. Vascular/Lymphatic: Aortic atherosclerosis. Normal caliber abdominal aorta. Smooth IVC contours. The portal, splenic and superior mesenteric veins are patent. No pathologically enlarged abdominal or pelvic lymph nodes. Reproductive: Uterus and bilateral adnexa are unremarkable. Other: No significant abdominopelvic free fluid. Musculoskeletal: No aggressive lytic or blastic lesion of bone. Diffuse demineralization of bone. Multilevel degenerative changes spine. Degenerative changes bilateral hips. 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 IMPRESSION: 1. Decreased size of the right hilar lymph node. 2. Stable 3 mm right upper lobe pulmonary nodule. 3. No evidence of new or progressive disease in the chest, abdomen or pelvis. 4. Resolved clustered nodularity in the right upper lobe. 5. Symmetric wall thickening of a nondistended urinary bladder. Correlate with urinalysis to exclude cystitis. 6. Aortic Atherosclerosis (ICD10-I70.0) and Emphysema (ICD10-J43.9). Electronically Signed   By: Reyes Holder M.D.   On: 12/17/2023 08:58   CT ABDOMEN PELVIS W CONTRAST Result Date: 12/17/2023 CLINICAL DATA:  Non-small cell lung cancer, follow-up. * Tracking Code: BO * EXAM: CT CHEST, ABDOMEN, AND PELVIS WITH CONTRAST TECHNIQUE: Multidetector CT imaging of the chest, abdomen and pelvis was performed following the standard protocol during bolus administration of intravenous contrast. RADIATION DOSE REDUCTION: This exam was performed according to the departmental dose-optimization program which includes automated exposure control, adjustment of the mA and/or kV according to patient size and/or use of iterative  reconstruction technique. CONTRAST:  80mL OMNIPAQUE  IOHEXOL  300 MG/ML  SOLN COMPARISON:  Multiple priors including CT September 21, 2023 FINDINGS: CT CHEST FINDINGS Cardiovascular: Accessed left chest Port-A-Cath with tip in the right atrium. Aortic atherosclerosis. Coronary artery calcifications. Normal size heart. No significant pericardial effusion/thickening. Mediastinum/Nodes: Right hilar lymph node measures 15 x 10 mm on image 74/2 previously 18 x 14 mm. No other pathologically enlarged mediastinal, hilar or axillary lymph nodes identified. The esophagus is grossly unremarkable. No suspicious thyroid  nodule. Lungs/Pleura: Emphysema. Mild diffuse bronchial wall thickening. Scattered atelectasis/scarring. Stable 3 mm right upper lobe pulmonary nodule on image 42/3. Resolved clustered nodularity in the right upper lobe. No new suspicious pulmonary nodules or masses. Musculoskeletal: No aggressive lytic or blastic lesion of bone. Multilevel degenerative changes spine. CT ABDOMEN PELVIS FINDINGS Hepatobiliary: Stable tiny bilobar hepatic cysts. No solid enhancing hepatic lesion. Gallbladder is unremarkable. No biliary ductal dilation. Pancreas: No pancreatic ductal dilation or evidence of acute inflammation. Spleen: No splenomegaly. Adrenals/Urinary Tract: Bilateral adrenal glands appear normal. No hydronephrosis. Kidneys demonstrate symmetric enhancement. Symmetric wall thickening of a nondistended urinary bladder. Stomach/Bowel: Stomach is unremarkable for degree of distension. No pathologic dilation of small or large bowel. No evidence of acute bowel inflammation. Vascular/Lymphatic: Aortic atherosclerosis. Normal caliber abdominal aorta. Smooth IVC contours. The portal, splenic and superior mesenteric veins are patent. No pathologically enlarged abdominal or pelvic lymph nodes. Reproductive: Uterus and bilateral adnexa are unremarkable. Other: No significant abdominopelvic free fluid. Musculoskeletal: No aggressive  lytic or blastic lesion of bone. Diffuse demineralization of bone. Multilevel degenerative changes spine. Degenerative changes bilateral hips. 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 IMPRESSION: 1. Decreased size of the right hilar lymph node. 2. Stable 3 mm right upper lobe pulmonary nodule. 3. No evidence of new or progressive disease in the chest, abdomen or pelvis. 4. Resolved clustered nodularity in the right upper lobe. 5. Symmetric wall thickening of a nondistended urinary bladder. Correlate with urinalysis to exclude cystitis. 6. Aortic Atherosclerosis (ICD10-I70.0) and Emphysema (ICD10-J43.9). Electronically Signed   By: Reyes Holder M.D.   On: 12/17/2023 08:58     ASSESSMENT/PLAN:  This is a very pleasant 68 year old female with extensive stage small cell lung cancer (T3, N3, M1 C) . She presented with large right lower lobe lung masses in addition to bulky right hilar and mediastinal lymphadenopathy as well as suspicious lytic bone lesion at L1. She was diagnosed in March 2024.    She is currently on systemic chemotherapy with carboplatin  for AUC of 4 on day 1 and etoposide  80 Mg/M2 on days 1, 2 and 3 with Neulasta  support on day 5 as well as Imfinzi  1500 Mg IV on day 1 every 3 weeks. She had reduced dose of carboplatin  and etoposide  side because of her HIV status.  Starting with cycle #5, she started single agent maintenance immunotherapy with Imfinzi  1500 mg IV every 4 weeks she is status post 17 cycles and tolerated it well.   The patient recently had a CT scan. I reviewed the results with the patient on the phone today. There was no findings for disease progression.    Recommend she proceed with cycle #16 Tuesday as scheduled as long as her labs permit.    We will see her back for labs and follow up in 4 weeks for evaluation and repeat blood work before undergoing cycle #17.    We will arrange for UA to ensure no UTI.  She is not having any symptoms or UTI.   I discussed the assessment and treatment plan with the patient. The patient was provided an opportunity to ask questions and all were answered. The patient agreed with the plan and demonstrated an understanding of the instructions.  The patient was advised to call back or seek an in-person evaluation if the symptoms worsen or if the condition fails to improve as anticipated.  I provided 20-29 minutes of Non-face to face encounter. during this encounter, and > 50% was spent counseling as documented under my assessment & plan.  Aquarius Latouche L Renatha Rosen, PA-C 12/18/2023 4:06 PM  No orders of the defined types were placed in this encounter.

## 2023-12-18 NOTE — Telephone Encounter (Signed)
 Left a voicemail with all appointment changes. Also asked that the patient call us  back to confirm the changed made.

## 2023-12-19 ENCOUNTER — Other Ambulatory Visit: Payer: Self-pay | Admitting: Physician Assistant

## 2023-12-19 ENCOUNTER — Ambulatory Visit: Admitting: Internal Medicine

## 2023-12-19 ENCOUNTER — Inpatient Hospital Stay

## 2023-12-19 ENCOUNTER — Ambulatory Visit

## 2023-12-19 VITALS — BP 133/89 | HR 107 | Temp 98.3°F | Resp 18 | Wt 135.5 lb

## 2023-12-19 DIAGNOSIS — N3289 Other specified disorders of bladder: Secondary | ICD-10-CM

## 2023-12-19 DIAGNOSIS — Z5112 Encounter for antineoplastic immunotherapy: Secondary | ICD-10-CM | POA: Diagnosis not present

## 2023-12-19 DIAGNOSIS — Z21 Asymptomatic human immunodeficiency virus [HIV] infection status: Secondary | ICD-10-CM | POA: Diagnosis not present

## 2023-12-19 DIAGNOSIS — C3431 Malignant neoplasm of lower lobe, right bronchus or lung: Secondary | ICD-10-CM | POA: Diagnosis not present

## 2023-12-19 DIAGNOSIS — Z95828 Presence of other vascular implants and grafts: Secondary | ICD-10-CM

## 2023-12-19 DIAGNOSIS — M899 Disorder of bone, unspecified: Secondary | ICD-10-CM | POA: Diagnosis not present

## 2023-12-19 LAB — CMP (CANCER CENTER ONLY)
ALT: 11 U/L (ref 0–44)
AST: 17 U/L (ref 15–41)
Albumin: 4 g/dL (ref 3.5–5.0)
Alkaline Phosphatase: 74 U/L (ref 38–126)
Anion gap: 5 (ref 5–15)
BUN: 13 mg/dL (ref 8–23)
CO2: 25 mmol/L (ref 22–32)
Calcium: 9.7 mg/dL (ref 8.9–10.3)
Chloride: 107 mmol/L (ref 98–111)
Creatinine: 0.73 mg/dL (ref 0.44–1.00)
GFR, Estimated: >60 mL/min (ref >60)
Glucose, Bld: 111 mg/dL — ABNORMAL HIGH (ref 70–99)
Potassium: 3.9 mmol/L (ref 3.5–5.1)
Sodium: 137 mmol/L (ref 135–145)
Total Bilirubin: 0.5 mg/dL (ref 0.0–1.2)
Total Protein: 8 g/dL (ref 6.5–8.1)

## 2023-12-19 LAB — URINALYSIS, COMPLETE (UACMP) WITH MICROSCOPIC
Bacteria, UA: NONE SEEN
Bilirubin Urine: NEGATIVE
Glucose, UA: NEGATIVE mg/dL
Ketones, ur: NEGATIVE mg/dL
Leukocytes,Ua: NEGATIVE
Nitrite: NEGATIVE
Protein, ur: NEGATIVE mg/dL
Specific Gravity, Urine: 1.006 (ref 1.005–1.030)
pH: 6 (ref 5.0–8.0)

## 2023-12-19 LAB — CBC WITH DIFFERENTIAL (CANCER CENTER ONLY)
Abs Immature Granulocytes: 0.01 K/uL (ref 0.00–0.07)
Basophils Absolute: 0 K/uL (ref 0.0–0.1)
Basophils Relative: 1 %
Eosinophils Absolute: 0.1 K/uL (ref 0.0–0.5)
Eosinophils Relative: 2 %
HCT: 39.8 % (ref 36.0–46.0)
Hemoglobin: 13.5 g/dL (ref 12.0–15.0)
Immature Granulocytes: 0 %
Lymphocytes Relative: 23 %
Lymphs Abs: 1.1 K/uL (ref 0.7–4.0)
MCH: 33.7 pg (ref 26.0–34.0)
MCHC: 33.9 g/dL (ref 30.0–36.0)
MCV: 99.3 fL (ref 80.0–100.0)
Monocytes Absolute: 0.4 K/uL (ref 0.1–1.0)
Monocytes Relative: 7 %
Neutro Abs: 3.3 K/uL (ref 1.7–7.7)
Neutrophils Relative %: 67 %
Platelet Count: 223 K/uL (ref 150–400)
RBC: 4.01 MIL/uL (ref 3.87–5.11)
RDW: 12.6 % (ref 11.5–15.5)
WBC Count: 4.9 K/uL (ref 4.0–10.5)
nRBC: 0 % (ref 0.0–0.2)

## 2023-12-19 MED ORDER — SODIUM CHLORIDE 0.9% FLUSH: 10.0000 mL | INTRAVENOUS | Status: DC | PRN

## 2023-12-19 MED ORDER — SODIUM CHLORIDE 0.9 % IV SOLN: Freq: Once | INTRAVENOUS | Status: AC

## 2023-12-19 MED ORDER — SODIUM CHLORIDE 0.9% FLUSH
10.0000 mL | Freq: Once | INTRAVENOUS | Status: AC
Start: 2023-12-19 — End: 2023-12-19
  Administered 2023-12-19: 10 mL

## 2023-12-19 MED ORDER — HEPARIN SOD (PORK) LOCK FLUSH 100 UNIT/ML IV SOLN: 500.0000 [IU] | Freq: Once | INTRAVENOUS | Status: DC | PRN

## 2023-12-19 MED ORDER — SODIUM CHLORIDE 0.9 % IV SOLN
1500.0000 mg | Freq: Once | INTRAVENOUS | Status: AC
  Administered 2023-12-19: 1500 mg via INTRAVENOUS
  Filled 2023-12-19: qty 30

## 2023-12-19 NOTE — Patient Instructions (Signed)
 [facility] - A DEPT OF [facility]  Discharge Instructions: Thank you for choosing [facility] to provide your oncology and hematology care.   If you have a lab appointment with the Cancer Center, please go directly to the Cancer Center and check in at the registration area.   Wear comfortable clothing and clothing appropriate for easy access to any Portacath or PICC line.   We strive to give you quality time with your provider. You may need to reschedule your appointment if you arrive late (15 or more minutes).  Arriving late affects you and other patients whose appointments are after yours.  Also, if you miss three or more appointments without notifying the office, you may be dismissed from the clinic at the provider's discretion.      For prescription refill requests, have your pharmacy contact our office and allow 72 hours for refills to be completed.    Today you received the following chemotherapy and/or immunotherapy agents: imfinzi   To help prevent nausea and vomiting after your treatment, we encourage you to take your nausea medication as directed.  BELOW ARE SYMPTOMS THAT SHOULD BE REPORTED IMMEDIATELY: *FEVER GREATER THAN 100.4 F (38 C) OR HIGHER *CHILLS OR SWEATING *NAUSEA AND VOMITING THAT IS NOT CONTROLLED WITH YOUR NAUSEA MEDICATION *UNUSUAL SHORTNESS OF BREATH *UNUSUAL BRUISING OR BLEEDING *URINARY PROBLEMS (pain or burning when urinating, or frequent urination) *BOWEL PROBLEMS (unusual diarrhea, constipation, pain near the anus) TENDERNESS IN MOUTH AND THROAT WITH OR WITHOUT PRESENCE OF ULCERS (sore throat, sores in mouth, or a toothache) UNUSUAL RASH, SWELLING OR PAIN  UNUSUAL VAGINAL DISCHARGE OR ITCHING   Items with * indicate a potential emergency and should be followed up as soon as possible or go to the Emergency Department if any problems should occur.  Please show the CHEMOTHERAPY ALERT CARD or IMMUNOTHERAPY ALERT  CARD at check-in to the Emergency Department and triage nurse.  Should you have questions after your visit or need to cancel or reschedule your appointment, please contact [facility] - A DEPT OF [facility]  Dept: [phone]  and follow the prompts.  Office hours are 8:00 a.m. to 4:30 p.m. Monday - Friday. Please note that voicemails left after 4:00 p.m. may not be returned until the following business day.  We are closed weekends and major holidays. You have access to a nurse at all times for urgent questions. Please call the main number to the clinic Dept: [phone] and follow the prompts.   For any non-urgent questions, you may also contact your provider using MyChart. We now offer e-Visits for anyone 65 and older to request care online for non-urgent symptoms. For details visit [email or web address].   Also download the MyChart app! Go to the app store, search "MyChart", open the app, select [facility], and log in with your MyChart username and password.

## 2023-12-20 LAB — URINE CULTURE

## 2023-12-23 ENCOUNTER — Other Ambulatory Visit: Payer: Self-pay

## 2023-12-23 ENCOUNTER — Other Ambulatory Visit (HOSPITAL_COMMUNITY): Payer: Self-pay

## 2023-12-27 ENCOUNTER — Other Ambulatory Visit: Payer: Self-pay

## 2023-12-27 NOTE — Progress Notes (Signed)
 Specialty Pharmacy Refill Coordination Note  [name] is a 68 y.o. female contacted today regarding refills of specialty medication(s) Dolutegravir -lamiVUDine  (DOVATO )   Patient requested Delivery   Delivery date: 12/29/23   Verified address: [address]  [place] [address]   Medication will be filled on 12/28/23.

## 2024-01-03 DIAGNOSIS — R0600 Dyspnea, unspecified: Secondary | ICD-10-CM | POA: Diagnosis not present

## 2024-01-05 DIAGNOSIS — R0600 Dyspnea, unspecified: Secondary | ICD-10-CM | POA: Diagnosis not present

## 2024-01-06 ENCOUNTER — Other Ambulatory Visit: Payer: Self-pay

## 2024-01-11 NOTE — Progress Notes (Signed)
 [name] OFFICE PROGRESS NOTE  Joan Domino, DO [address] [place] [address]  DIAGNOSIS:  1) Extensive stage small cell lung cancer (T3, N3, M1 C) . She presented with large right lower lobe lung masses in addition to bulky right hilar and mediastinal lymphadenopathy as well as suspicious lytic bone lesion at L1. She was diagnosed in March 2024.  2) HIV diagnosed in March 2024 follows with Dr. Efrain from infectious disease  PRIOR THERAPY: None  CURRENT THERAPY: Systemic chemotherapy with carboplatin  for AUC of 4 on day 1 and etoposide  80 Mg/M2 on days 1, 2 and 3 with Imfinzi  1500 Mg on day 1 every 3 weeks with Neulasta  support on day 5. First dose September 14, 2022 . She is on dose reduced chemotherapy due to her HIV. Status post 18 cycles. Starting from cycle #5, she started single agent maintenance immunotherapy with Imfinzi  1500 mg IV every 4 weeks.   INTERVAL HISTORY: [name] 68 y.o. female returns to the clinic today for a follow up visit. The patient was last seen in the clinic 1 month ago by myself 1 month ago.  She is currently undergoing single agent maintenance immunotherapy with Imfinzi .  She is tolerating treatment well without any adverse side effects.   She denies any concerning complaints today.  She denies any fever, chills, night sweats, or unexplained weight loss.  She denies any changes in her breathing or significant cough besides her baseline cough.  She denies any chest pain or hemoptysis.  She denies any nausea, vomiting, diarrhea, or constipation. She denies any headache or visual changes.  She denies any rashes or skin changes.  Her visit today is for a toxicity check prior to undergoing cycle #19.  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  MEDICATIONS:  Current Outpatient Medications  Medication Sig Dispense Refill   clopidogrel  (PLAVIX ) 75 MG tablet Take 1 tablet (75 mg total) by mouth daily. 30 tablet 5   dolutegravir -lamiVUDine  (DOVATO ) 50-300 MG tablet Take 1 tablet by mouth daily. 30 tablet 11   No current facility-administered medications for this visit.    SURGICAL HISTORY:  Past Surgical History:  Procedure Laterality Date   BRONCHIAL NEEDLE ASPIRATION BIOPSY  09/02/2022   Procedure: BRONCHIAL NEEDLE ASPIRATION BIOPSIES;  Surgeon: Brenna Adine CROME, DO;  Location: [facility];  Service: Cardiopulmonary;;   IR IMAGING GUIDED PORT INSERTION  09/29/2022   MULTIPLE TOOTH EXTRACTIONS     ORIF ANKLE FRACTURE  05/03/2012   Procedure: OPEN REDUCTION INTERNAL FIXATION (ORIF) ANKLE FRACTURE;  Surgeon: Norleen Armor, MD;  Location: [facility];  Service: Orthopedics;  Laterality: Left;   VIDEO BRONCHOSCOPY WITH ENDOBRONCHIAL ULTRASOUND Bilateral 09/02/2022   Procedure: VIDEO BRONCHOSCOPY WITH ENDOBRONCHIAL ULTRASOUND;  Surgeon: Brenna Adine CROME, DO;  Location: [facility];  Service: Cardiopulmonary;  Laterality: Bilateral;    REVIEW OF SYSTEMS:   Review of Systems  Constitutional: Negative for appetite change, chills, fatigue, fever and unexpected weight change.  HENT:  Negative for mouth sores, nosebleeds, sore throat and trouble swallowing.   Eyes: Negative for eye problems and icterus.  Respiratory: Negative for cough, hemoptysis, shortness of breath and wheezing.  Cardiovascular: Negative for chest pain and leg swelling.  Gastrointestinal: Negative for abdominal pain, constipation, diarrhea, nausea and vomiting.  Genitourinary: Negative for bladder incontinence, difficulty urinating, dysuria, frequency and hematuria.   Musculoskeletal: Negative for back pain, gait problem, neck pain and neck stiffness.  Skin: Negative for itching and rash.  Neurological:  Negative for dizziness, extremity weakness, gait problem, headaches, light-headedness and seizures.  Hematological: Negative for adenopathy. Does not bruise/bleed easily.  Psychiatric/Behavioral: Negative for confusion, depression and sleep disturbance. The patient is not nervous/anxious.     PHYSICAL EXAMINATION:  There were no vitals taken for this visit.  ECOG PERFORMANCE STATUS: 1  Physical Exam  Constitutional: Oriented to person, place, and time and thin appearing female and in no distress. HENT:  Head: Normocephalic and atraumatic.  Mouth/Throat: Oropharynx is clear and moist. No oropharyngeal exudate.  Eyes: Conjunctivae are normal. Right eye exhibits no discharge. Left eye exhibits no discharge. No scleral icterus.  Neck: Normal range of motion. Neck supple.  Cardiovascular: Normal rate, regular rhythm, normal heart sounds and intact distal pulses.   Pulmonary/Chest: Effort normal and breath sounds normal. No respiratory distress. No wheezes. No rales.  Abdominal: Soft. Bowel sounds are normal. Exhibits no distension and no mass. There is no tenderness.  Musculoskeletal: Normal range of motion. Exhibits no edema.  Lymphadenopathy:    No cervical adenopathy.  Neurological: Alert and oriented to person, place, and time. Exhibits normal muscle tone. Gait normal. Coordination normal.  Skin: Skin is warm and dry. No rash noted. Not diaphoretic. No erythema. No pallor.  Psychiatric: Mood, memory and judgment normal.  Vitals reviewed.  LABORATORY DATA: Lab Results  Component Value Date   WBC 4.9 12/19/2023   HGB 13.5 12/19/2023   HCT 39.8 12/19/2023   MCV 99.3 12/19/2023   PLT 223 12/19/2023      Chemistry      Component Value Date/Time   NA 137 12/19/2023 1017   K 3.9 12/19/2023 1017   CL 107 12/19/2023 1017   CO2 25 12/19/2023 1017   BUN 13 12/19/2023 1017   CREATININE 0.73 12/19/2023 1017   CREATININE 0.53 09/21/2022 0942      Component Value Date/Time   CALCIUM  9.7 12/19/2023 1017   ALKPHOS 74 12/19/2023 1017   AST 17 12/19/2023 1017   ALT 11 12/19/2023 1017   BILITOT 0.5 12/19/2023 1017       RADIOGRAPHIC STUDIES:  CT Chest W Contrast Result Date: 12/17/2023 CLINICAL DATA:  Non-small cell lung cancer, follow-up. * Tracking Code: BO * EXAM: CT CHEST, ABDOMEN, AND PELVIS WITH CONTRAST TECHNIQUE: Multidetector CT imaging of the chest, abdomen and pelvis was performed following the standard protocol during bolus administration of intravenous contrast. RADIATION DOSE REDUCTION: This exam was performed according to the departmental dose-optimization program which includes automated exposure control, adjustment of the mA and/or kV according to patient size and/or use of iterative reconstruction technique. CONTRAST:  80mL OMNIPAQUE  IOHEXOL  300 MG/ML  SOLN COMPARISON:  Multiple priors including CT September 21, 2023 FINDINGS: CT CHEST FINDINGS Cardiovascular: Accessed left chest Port-A-Cath with tip in the right atrium. Aortic atherosclerosis. Coronary artery calcifications. Normal size heart. No significant pericardial effusion/thickening. Mediastinum/Nodes: Right hilar lymph node measures 15 x 10 mm on image 74/2 previously 18 x 14 mm. No other pathologically enlarged mediastinal, hilar or axillary lymph nodes identified. The esophagus is grossly unremarkable. No suspicious thyroid  nodule. Lungs/Pleura: Emphysema. Mild diffuse bronchial wall thickening. Scattered atelectasis/scarring. Stable 3 mm right upper lobe pulmonary nodule on  image 42/3. Resolved clustered nodularity in the right upper lobe. No new suspicious pulmonary nodules or masses. Musculoskeletal: No aggressive lytic or blastic lesion of bone. Multilevel degenerative changes spine. CT ABDOMEN PELVIS FINDINGS Hepatobiliary: Stable tiny bilobar hepatic cysts. No solid enhancing hepatic lesion. Gallbladder is unremarkable. No biliary ductal dilation. Pancreas: No pancreatic ductal dilation or evidence of acute  inflammation. Spleen: No splenomegaly. Adrenals/Urinary Tract: Bilateral adrenal glands appear normal. No hydronephrosis. Kidneys demonstrate symmetric enhancement. Symmetric wall thickening of a nondistended urinary bladder. Stomach/Bowel: Stomach is unremarkable for degree of distension. No pathologic dilation of small or large bowel. No evidence of acute bowel inflammation. Vascular/Lymphatic: Aortic atherosclerosis. Normal caliber abdominal aorta. Smooth IVC contours. The portal, splenic and superior mesenteric veins are patent. No pathologically enlarged abdominal or pelvic lymph nodes. Reproductive: Uterus and bilateral adnexa are unremarkable. Other: No significant abdominopelvic free fluid. Musculoskeletal: No aggressive lytic or blastic lesion of bone. Diffuse demineralization of bone. Multilevel degenerative changes spine. Degenerative changes bilateral hips. 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 IMPRESSION: 1. Decreased size of the right hilar lymph node. 2. Stable 3 mm right upper lobe pulmonary nodule. 3. No evidence of new or progressive disease in the chest, abdomen or pelvis. 4. Resolved clustered nodularity in the right upper lobe. 5. Symmetric wall thickening of a nondistended urinary bladder. Correlate with urinalysis to exclude cystitis. 6. Aortic Atherosclerosis (ICD10-I70.0) and Emphysema (ICD10-J43.9). Electronically Signed   By: Reyes Holder M.D.   On: 12/17/2023 08:58   CT ABDOMEN PELVIS W CONTRAST Result Date: 12/17/2023 CLINICAL DATA:  Non-small cell lung cancer, follow-up. * Tracking Code: BO * EXAM: CT CHEST, ABDOMEN, AND PELVIS WITH CONTRAST TECHNIQUE: Multidetector CT imaging of the chest, abdomen and pelvis was performed following the standard protocol during bolus administration of intravenous contrast. RADIATION DOSE REDUCTION: This exam was performed according to the departmental dose-optimization program which  includes automated exposure control, adjustment of the mA and/or kV according to patient size and/or use of iterative reconstruction technique. CONTRAST:  80mL OMNIPAQUE  IOHEXOL  300 MG/ML  SOLN COMPARISON:  Multiple priors including CT September 21, 2023 FINDINGS: CT CHEST FINDINGS Cardiovascular: Accessed left chest Port-A-Cath with tip in the right atrium. Aortic atherosclerosis. Coronary artery calcifications. Normal size heart. No significant pericardial effusion/thickening. Mediastinum/Nodes: Right hilar lymph node measures 15 x 10 mm on image 74/2 previously 18 x 14 mm. No other pathologically enlarged mediastinal, hilar or axillary lymph nodes identified. The esophagus is grossly unremarkable. No suspicious thyroid  nodule. Lungs/Pleura: Emphysema. Mild diffuse bronchial wall thickening. Scattered atelectasis/scarring. Stable 3 mm right upper lobe pulmonary nodule on image 42/3. Resolved clustered nodularity in the right upper lobe. No new suspicious pulmonary nodules or masses. Musculoskeletal: No aggressive lytic or blastic lesion of bone. Multilevel degenerative changes spine. CT ABDOMEN PELVIS FINDINGS Hepatobiliary: Stable tiny bilobar hepatic cysts. No solid enhancing hepatic lesion. Gallbladder is unremarkable. No biliary ductal dilation. Pancreas: No pancreatic ductal dilation or evidence of acute inflammation. Spleen: No splenomegaly. Adrenals/Urinary Tract: Bilateral adrenal glands appear normal. No hydronephrosis. Kidneys demonstrate symmetric enhancement. Symmetric wall thickening of a nondistended urinary bladder. Stomach/Bowel: Stomach is unremarkable for degree of distension. No pathologic dilation of small or large bowel. No evidence of acute bowel inflammation. Vascular/Lymphatic: Aortic atherosclerosis. Normal caliber abdominal aorta. Smooth IVC contours. The portal, splenic and superior mesenteric veins are patent. No pathologically enlarged abdominal or pelvic lymph nodes. Reproductive: Uterus  and bilateral adnexa are unremarkable. Other: No significant abdominopelvic free fluid. Musculoskeletal:  No aggressive lytic or blastic lesion of bone. Diffuse demineralization of bone. Multilevel degenerative changes spine. Degenerative changes bilateral hips. 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 IMPRESSION: 1. Decreased size of the right hilar lymph node. 2. Stable 3 mm right upper lobe pulmonary nodule. 3. No evidence of new or progressive disease in the chest, abdomen or pelvis. 4. Resolved clustered nodularity in the right upper lobe. 5. Symmetric wall thickening of a nondistended urinary bladder. Correlate with urinalysis to exclude cystitis. 6. Aortic Atherosclerosis (ICD10-I70.0) and Emphysema (ICD10-J43.9). Electronically Signed   By: Reyes Holder M.D.   On: 12/17/2023 08:58     ASSESSMENT/PLAN:  This is a very pleasant 68 year old female with extensive stage small cell lung cancer (T3, N3, M1 C) . She presented with large right lower lobe lung masses in addition to bulky right hilar and mediastinal lymphadenopathy as well as suspicious lytic bone lesion at L1. She was diagnosed in March 2024.    She is currently on systemic chemotherapy with carboplatin  for AUC of 4 on day 1 and etoposide  80 Mg/M2 on days 1, 2 and 3 with Neulasta  support on day 5 as well as Imfinzi  1500 Mg IV on day 1 every 3 weeks. She had reduced dose of carboplatin  and etoposide  side because of her HIV status.  Starting with cycle #5, she started single agent maintenance immunotherapy with Imfinzi  1500 mg IV every 4 weeks she is status post 18 cycles and tolerated it well.    Labs were reviewed. Recommend she proceed with cycle #19 today as scheduled.   We will see her back for labs and follow up in 4 weeks for evaluation and repeat blood work before undergoing cycle #20.    The patient was advised to call immediately if she has any concerning symptoms in  the interval. The patient voices understanding of current disease status and treatment options and is in agreement with the current care plan. All questions were answered. The patient knows to call the clinic with any problems, questions or concerns. We can certainly see the patient much sooner if necessary       No orders of the defined types were placed in this encounter.    The total time spent in the appointment was 20-29 minutes  Joan Zapf L Milam Allbaugh, PA-C 01/11/24

## 2024-01-16 ENCOUNTER — Inpatient Hospital Stay: Attending: Internal Medicine

## 2024-01-16 ENCOUNTER — Inpatient Hospital Stay

## 2024-01-16 ENCOUNTER — Inpatient Hospital Stay (HOSPITAL_BASED_OUTPATIENT_CLINIC_OR_DEPARTMENT_OTHER): Admitting: Physician Assistant

## 2024-01-16 ENCOUNTER — Other Ambulatory Visit: Payer: Self-pay

## 2024-01-16 VITALS — HR 92

## 2024-01-16 VITALS — BP 137/75 | HR 104 | Temp 97.9°F | Wt 137.6 lb

## 2024-01-16 DIAGNOSIS — Z5112 Encounter for antineoplastic immunotherapy: Secondary | ICD-10-CM

## 2024-01-16 DIAGNOSIS — C3431 Malignant neoplasm of lower lobe, right bronchus or lung: Secondary | ICD-10-CM | POA: Insufficient documentation

## 2024-01-16 DIAGNOSIS — M899 Disorder of bone, unspecified: Secondary | ICD-10-CM | POA: Insufficient documentation

## 2024-01-16 DIAGNOSIS — Z95828 Presence of other vascular implants and grafts: Secondary | ICD-10-CM

## 2024-01-16 DIAGNOSIS — C3490 Malignant neoplasm of unspecified part of unspecified bronchus or lung: Secondary | ICD-10-CM | POA: Diagnosis not present

## 2024-01-16 DIAGNOSIS — Z21 Asymptomatic human immunodeficiency virus [HIV] infection status: Secondary | ICD-10-CM | POA: Insufficient documentation

## 2024-01-16 LAB — CMP (CANCER CENTER ONLY)
ALT: 13 U/L (ref 0–44)
AST: 18 U/L (ref 15–41)
Albumin: 4.1 g/dL (ref 3.5–5.0)
Alkaline Phosphatase: 79 U/L (ref 38–126)
Anion gap: 5 (ref 5–15)
BUN: 17 mg/dL (ref 8–23)
CO2: 29 mmol/L (ref 22–32)
Calcium: 9.5 mg/dL (ref 8.9–10.3)
Chloride: 105 mmol/L (ref 98–111)
Creatinine: 0.8 mg/dL (ref 0.44–1.00)
GFR, Estimated: >60 mL/min (ref >60)
Glucose, Bld: 104 mg/dL — ABNORMAL HIGH (ref 70–99)
Potassium: 3.5 mmol/L (ref 3.5–5.1)
Sodium: 139 mmol/L (ref 135–145)
Total Bilirubin: 0.8 mg/dL (ref 0.0–1.2)
Total Protein: 8.3 g/dL — ABNORMAL HIGH (ref 6.5–8.1)

## 2024-01-16 LAB — CBC WITH DIFFERENTIAL (CANCER CENTER ONLY)
Abs Immature Granulocytes: 0.01 K/uL (ref 0.00–0.07)
Basophils Absolute: 0 K/uL (ref 0.0–0.1)
Basophils Relative: 1 %
Eosinophils Absolute: 0.1 K/uL (ref 0.0–0.5)
Eosinophils Relative: 3 %
HCT: 38.1 % (ref 36.0–46.0)
Hemoglobin: 13 g/dL (ref 12.0–15.0)
Immature Granulocytes: 0 %
Lymphocytes Relative: 25 %
Lymphs Abs: 1.2 K/uL (ref 0.7–4.0)
MCH: 33.5 pg (ref 26.0–34.0)
MCHC: 34.1 g/dL (ref 30.0–36.0)
MCV: 98.2 fL (ref 80.0–100.0)
Monocytes Absolute: 0.3 K/uL (ref 0.1–1.0)
Monocytes Relative: 7 %
Neutro Abs: 3 K/uL (ref 1.7–7.7)
Neutrophils Relative %: 64 %
Platelet Count: 226 K/uL (ref 150–400)
RBC: 3.88 MIL/uL (ref 3.87–5.11)
RDW: 12.5 % (ref 11.5–15.5)
WBC Count: 4.6 K/uL (ref 4.0–10.5)
nRBC: 0 % (ref 0.0–0.2)

## 2024-01-16 MED ORDER — SODIUM CHLORIDE 0.9 % IV SOLN
1500.0000 mg | Freq: Once | INTRAVENOUS | Status: AC
  Administered 2024-01-16: 1500 mg via INTRAVENOUS
  Filled 2024-01-16: qty 30

## 2024-01-16 MED ORDER — SODIUM CHLORIDE 0.9% FLUSH
10.0000 mL | Freq: Once | INTRAVENOUS | Status: AC
Start: 2024-01-16 — End: 2024-01-16
  Administered 2024-01-16: 10 mL

## 2024-01-16 MED ORDER — SODIUM CHLORIDE 0.9 % IV SOLN
Freq: Once | INTRAVENOUS | Status: AC
Start: 2024-01-16 — End: 2024-01-16

## 2024-01-16 NOTE — Patient Instructions (Signed)

## 2024-01-17 ENCOUNTER — Other Ambulatory Visit: Payer: Self-pay

## 2024-01-19 ENCOUNTER — Other Ambulatory Visit: Payer: Self-pay

## 2024-01-22 ENCOUNTER — Other Ambulatory Visit: Payer: Self-pay

## 2024-01-22 NOTE — Progress Notes (Signed)
 Specialty Pharmacy Refill Coordination Note  NUPUR HOHMAN is a 68 y.o. female contacted today regarding refills of specialty medication(s) Dolutegravir -lamiVUDine  (DOVATO )   Patient requested Delivery   Delivery date: 01/24/24   Verified address: [address] [place] [address]   Medication will be filled on 01/23/24.

## 2024-01-23 ENCOUNTER — Telehealth: Payer: Self-pay | Admitting: Medical Oncology

## 2024-01-23 NOTE — Telephone Encounter (Signed)
 Pt stated she needs something for a bad cough . If something is ordered please have [facility] mail to her because she does not have transportation. Next appt 02/13/24

## 2024-01-24 ENCOUNTER — Ambulatory Visit: Admitting: Internal Medicine

## 2024-01-24 ENCOUNTER — Ambulatory Visit: Payer: 59 | Admitting: Internal Medicine

## 2024-01-25 ENCOUNTER — Other Ambulatory Visit: Payer: Self-pay

## 2024-01-25 NOTE — Progress Notes (Signed)
 Spoke with patient to offer Delsym Cough Medicine for a cough per Dr. Gatha. Patient declined this cough medication stating, That stuff does not do anything for me.   Dr. Gatha informed.

## 2024-01-29 ENCOUNTER — Encounter: Payer: Self-pay | Admitting: Internal Medicine

## 2024-01-29 ENCOUNTER — Ambulatory Visit: Admitting: Internal Medicine

## 2024-01-29 ENCOUNTER — Other Ambulatory Visit: Payer: Self-pay

## 2024-01-29 VITALS — BP 127/74 | HR 96 | Temp 98.2°F | Ht 69.0 in | Wt 134.0 lb

## 2024-01-29 DIAGNOSIS — Z79899 Other long term (current) drug therapy: Secondary | ICD-10-CM | POA: Diagnosis not present

## 2024-01-29 DIAGNOSIS — Z21 Asymptomatic human immunodeficiency virus [HIV] infection status: Secondary | ICD-10-CM

## 2024-01-29 DIAGNOSIS — B20 Human immunodeficiency virus [HIV] disease: Secondary | ICD-10-CM | POA: Diagnosis not present

## 2024-01-29 NOTE — Progress Notes (Signed)
 [facility]     HPI: [name] is a 68 y.o. female presents for HIV management on Dovato . NO missed doses. On chemotherpay for Scl lung cancer. 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   signed out ama-has never gone back to have worked up    Past Surgical History:  Procedure Laterality Date   BRONCHIAL NEEDLE ASPIRATION BIOPSY  09/02/2022   Procedure: BRONCHIAL NEEDLE ASPIRATION BIOPSIES;  Surgeon: Brenna Adine CROME, DO;  Location: [facility];  Service: Cardiopulmonary;;   IR IMAGING GUIDED PORT INSERTION  09/29/2022   MULTIPLE TOOTH EXTRACTIONS     ORIF ANKLE FRACTURE  05/03/2012   Procedure: OPEN REDUCTION INTERNAL FIXATION (ORIF) ANKLE FRACTURE;  Surgeon: Norleen Armor, MD;  Location: [facility];  Service: Orthopedics;  Laterality: Left;   VIDEO BRONCHOSCOPY WITH ENDOBRONCHIAL ULTRASOUND Bilateral 09/02/2022   Procedure: VIDEO BRONCHOSCOPY WITH ENDOBRONCHIAL ULTRASOUND;  Surgeon: Brenna Adine CROME, DO;  Location: [facility];  Service: Cardiopulmonary;  Laterality: Bilateral;    Family History  Problem Relation Age of Onset   Hypertension Mother    Diabetes Mother    Heart disease Sister    Heart disease Sister    Current Outpatient Medications on File Prior to Visit  Medication Sig Dispense Refill   clopidogrel  (PLAVIX ) 75 MG tablet Take 1 tablet (75 mg total) by mouth daily. 30 tablet 5   dolutegravir -lamiVUDine  (DOVATO ) 50-300 MG tablet Take 1 tablet by mouth daily. 30 tablet 11   No current  facility-administered medications on file prior to visit.    No Known Allergies    Lab Results HIV 1 RNA Quant (Copies/mL)  Date Value  07/18/2023 <20 (H)  03/16/2023 <20 (H)  11/25/2022 <20 (H)   CD4 T Cell Abs (/uL)  Date Value  07/18/2023 395 (L)  03/16/2023 421  09/21/2022 333 (L)   No results found for: HIV1GENOSEQ Lab Results  Component Value Date   WBC 4.6 01/16/2024   HGB 13.0 01/16/2024   HCT 38.1 01/16/2024   MCV 98.2 01/16/2024   PLT 226 01/16/2024    Lab Results  Component Value Date   CREATININE 0.80 01/16/2024   BUN 17 01/16/2024   NA 139 01/16/2024   K 3.5 01/16/2024   CL 105 01/16/2024   CO2 29 01/16/2024   Lab Results  Component Value Date   ALT 13 01/16/2024   AST 18 01/16/2024   ALKPHOS 79 01/16/2024   BILITOT 0.8 01/16/2024    Lab Results  Component Value Date   CHOL 152 09/21/2022   TRIG 71 09/21/2022   HDL 58 09/21/2022   LDLCALC 79 09/21/2022   Lab Results  Component Value Date   HAV REACTIVE (A) 09/21/2022   Lab Results  Component Value Date   HEPBSAG NON-REACTIVE 09/21/2022   HEPBSAB REACTIVE (A) 09/21/2022   No results found for: HCVAB Lab Results  Component Value Date   CHLAMYDIAWP Negative 09/21/2022   N Negative 09/21/2022   No results found for: GCPROBEAPT No results found  for: QUANTGOLD  Assessment/Plan #HIV -CD4 395, VLnd, on  2/25 -on dovato  -f.uin  66month   #SCC of lung  -DS march 2024 on chemo -Discussed smoking cessation.  Declned patch an dgum    #Vaccination-declined vaccines COVID-2024 Flu-2024 Monkeypox PCV-> declined Meningitis HepA immune HEpB immune core and ab + Tdap Shingles  #Health maintenance,,  -Quantiferon-nr 09/21/22, today -RPR 1:1, today -HCV ab nr on 4/10 -GC negati , today -Lipid, today -Colonoscopy-declined referral    Loney Stank, MD [facility] I have personally spent 45 minutes involved in  face-to-face and non-face-to-face activities for this patient on the day of the visit. Professional time spent includes the following activities: Preparing to see the patient (review of tests), Obtaining and/or reviewing separately obtained history (admission/discharge record), Performing a medically appropriate examination and/or evaluation , Ordering medications/tests/procedures, referring and communicating with other health care professionals, Documenting clinical information in the EMR, Independently interpreting results (not separately reported), Communicating results to the patient/family/caregiver, Counseling and educating the patient/family/caregiver and Care coordination (not separately reported).

## 2024-01-30 LAB — T-HELPER CELLS (CD4) COUNT (NOT AT ARMC)
CD4 % Helper T Cell: 49 % (ref 33–65)
CD4 T Cell Abs: 631 /uL (ref 400–1790)

## 2024-02-01 LAB — QUANTIFERON-TB GOLD PLUS
Mitogen-NIL: >10 [IU]/mL
NIL: 0.04 [IU]/mL
QuantiFERON-TB Gold Plus: NEGATIVE
TB1-NIL: 0 [IU]/mL
TB2-NIL: <0 [IU]/mL

## 2024-02-01 LAB — T PALLIDUM AB: T Pallidum Abs: POSITIVE — AB

## 2024-02-01 LAB — C. TRACHOMATIS/N. GONORRHOEAE RNA
C. trachomatis RNA, TMA: NOT DETECTED
N. gonorrhoeae RNA, TMA: NOT DETECTED

## 2024-02-01 LAB — LIPID PANEL
Cholesterol: 197 mg/dL (ref <200)
HDL: 60 mg/dL (ref >=50)
LDL Cholesterol (Calc): 118 mg/dL — ABNORMAL HIGH
Non-HDL Cholesterol (Calc): 137 mg/dL — ABNORMAL HIGH (ref <130)
Total CHOL/HDL Ratio: 3.3 (calc) (ref <5.0)
Triglycerides: 86 mg/dL (ref <150)

## 2024-02-01 LAB — HIV-1 RNA QUANT-NO REFLEX-BLD
HIV 1 RNA Quant: <20 {copies}/mL — AB
HIV-1 RNA Quant, Log: <1.3 {Log_copies}/mL — AB

## 2024-02-01 LAB — HEPATITIS C ANTIBODY: Hepatitis C Ab: NONREACTIVE

## 2024-02-01 LAB — RPR TITER: RPR Titer: 1:8 {titer} — ABNORMAL HIGH

## 2024-02-01 LAB — RPR: RPR Ser Ql: REACTIVE — AB

## 2024-02-02 ENCOUNTER — Telehealth: Payer: Self-pay

## 2024-02-02 ENCOUNTER — Other Ambulatory Visit (HOSPITAL_COMMUNITY): Payer: Self-pay

## 2024-02-02 DIAGNOSIS — Z8619 Personal history of other infectious and parasitic diseases: Secondary | ICD-10-CM

## 2024-02-02 MED ORDER — DOXYCYCLINE HYCLATE 100 MG PO TABS
100.0000 mg | ORAL_TABLET | Freq: Two times a day (BID) | ORAL | 0 refills | Status: AC
  Filled 2024-02-02: qty 56, 28d supply, fill #0

## 2024-02-02 NOTE — Telephone Encounter (Signed)
 Per Dr. Dennise patient tested positive for syphillis. Will need to be treated with doxycyline for 28 days. Spoke with her regarding results and treatment.  Answered all questions during call. Prescription sent [facility].  Lorenda CHRISTELLA Code, RMA

## 2024-02-03 DIAGNOSIS — R0600 Dyspnea, unspecified: Secondary | ICD-10-CM | POA: Diagnosis not present

## 2024-02-04 ENCOUNTER — Other Ambulatory Visit: Payer: Self-pay

## 2024-02-05 DIAGNOSIS — R0600 Dyspnea, unspecified: Secondary | ICD-10-CM | POA: Diagnosis not present

## 2024-02-06 ENCOUNTER — Other Ambulatory Visit: Payer: Self-pay

## 2024-02-13 ENCOUNTER — Inpatient Hospital Stay (HOSPITAL_BASED_OUTPATIENT_CLINIC_OR_DEPARTMENT_OTHER): Admitting: Internal Medicine

## 2024-02-13 ENCOUNTER — Inpatient Hospital Stay

## 2024-02-13 ENCOUNTER — Inpatient Hospital Stay: Attending: Internal Medicine

## 2024-02-13 VITALS — BP 131/75 | HR 92 | Temp 98.1°F | Resp 17 | Ht 69.0 in | Wt 138.0 lb

## 2024-02-13 DIAGNOSIS — Z5112 Encounter for antineoplastic immunotherapy: Secondary | ICD-10-CM | POA: Diagnosis not present

## 2024-02-13 DIAGNOSIS — Z21 Asymptomatic human immunodeficiency virus [HIV] infection status: Secondary | ICD-10-CM | POA: Diagnosis not present

## 2024-02-13 DIAGNOSIS — C3490 Malignant neoplasm of unspecified part of unspecified bronchus or lung: Secondary | ICD-10-CM | POA: Diagnosis not present

## 2024-02-13 DIAGNOSIS — C3431 Malignant neoplasm of lower lobe, right bronchus or lung: Secondary | ICD-10-CM

## 2024-02-13 DIAGNOSIS — R590 Localized enlarged lymph nodes: Secondary | ICD-10-CM | POA: Diagnosis not present

## 2024-02-13 DIAGNOSIS — M899 Disorder of bone, unspecified: Secondary | ICD-10-CM | POA: Insufficient documentation

## 2024-02-13 DIAGNOSIS — Z79620 Long term (current) use of immunosuppressive biologic: Secondary | ICD-10-CM | POA: Diagnosis not present

## 2024-02-13 DIAGNOSIS — J40 Bronchitis, not specified as acute or chronic: Secondary | ICD-10-CM | POA: Diagnosis not present

## 2024-02-13 LAB — CBC WITH DIFFERENTIAL (CANCER CENTER ONLY)
Abs Immature Granulocytes: 0.01 K/uL (ref 0.00–0.07)
Basophils Absolute: 0 K/uL (ref 0.0–0.1)
Basophils Relative: 1 %
Eosinophils Absolute: 0.1 K/uL (ref 0.0–0.5)
Eosinophils Relative: 2 %
HCT: 35.6 % — ABNORMAL LOW (ref 36.0–46.0)
Hemoglobin: 11.7 g/dL — ABNORMAL LOW (ref 12.0–15.0)
Immature Granulocytes: 0 %
Lymphocytes Relative: 21 %
Lymphs Abs: 1 K/uL (ref 0.7–4.0)
MCH: 33.1 pg (ref 26.0–34.0)
MCHC: 32.9 g/dL (ref 30.0–36.0)
MCV: 100.6 fL — ABNORMAL HIGH (ref 80.0–100.0)
Monocytes Absolute: 0.3 K/uL (ref 0.1–1.0)
Monocytes Relative: 6 %
Neutro Abs: 3.3 K/uL (ref 1.7–7.7)
Neutrophils Relative %: 70 %
Platelet Count: 227 K/uL (ref 150–400)
RBC: 3.54 MIL/uL — ABNORMAL LOW (ref 3.87–5.11)
RDW: 12.9 % (ref 11.5–15.5)
WBC Count: 4.7 K/uL (ref 4.0–10.5)
nRBC: 0 % (ref 0.0–0.2)

## 2024-02-13 LAB — CMP (CANCER CENTER ONLY)
ALT: 7 U/L (ref 0–44)
AST: 13 U/L — ABNORMAL LOW (ref 15–41)
Albumin: 3.6 g/dL (ref 3.5–5.0)
Alkaline Phosphatase: 66 U/L (ref 38–126)
Anion gap: 4 — ABNORMAL LOW (ref 5–15)
BUN: 14 mg/dL (ref 8–23)
CO2: 26 mmol/L (ref 22–32)
Calcium: 9.2 mg/dL (ref 8.9–10.3)
Chloride: 110 mmol/L (ref 98–111)
Creatinine: 0.81 mg/dL (ref 0.44–1.00)
GFR, Estimated: >60 mL/min (ref >60)
Glucose, Bld: 83 mg/dL (ref 70–99)
Potassium: 3.8 mmol/L (ref 3.5–5.1)
Sodium: 140 mmol/L (ref 135–145)
Total Bilirubin: 0.5 mg/dL (ref 0.0–1.2)
Total Protein: 7.8 g/dL (ref 6.5–8.1)

## 2024-02-13 LAB — TSH: TSH: 0.517 u[IU]/mL (ref 0.350–4.500)

## 2024-02-13 MED ORDER — SODIUM CHLORIDE 0.9 % IV SOLN: Freq: Once | INTRAVENOUS | Status: AC

## 2024-02-13 MED ORDER — SODIUM CHLORIDE 0.9 % IV SOLN
1500.0000 mg | Freq: Once | INTRAVENOUS | Status: AC
  Administered 2024-02-13: 1500 mg via INTRAVENOUS
  Filled 2024-02-13: qty 30

## 2024-02-13 NOTE — Patient Instructions (Signed)

## 2024-02-13 NOTE — Progress Notes (Signed)
 [facility] [phone]   [phone]  OFFICE PROGRESS NOTE  [name], [name] [address] [place] [address]  DIAGNOSIS:  1) Extensive stage small cell lung cancer (T3, N3, M1 C) . She presented with large right lower lobe lung masses in addition to bulky right hilar and mediastinal lymphadenopathy as well as suspicious lytic bone lesion at L1. She was diagnosed in March 2024.  2) HIV diagnosed in March 2024   PRIOR THERAPY: None   CURRENT THERAPY: Systemic chemotherapy with carboplatin  for AUC of 4 on day 1 and etoposide  80 Mg/M2 on days 1, 2 and 3 with Imfinzi  1500 Mg on day 1 every 3 weeks with Neulasta  support on day 5. First dose September 14, 2022 . She is on dose reduced chemotherapy due to her HIV. Status post 19 cycles.  Starting from cycle #5 her treatment with with maintenance immunotherapy with Imfinzi  1500 Mg IV every 4 weeks  INTERVAL HISTORY: [name] 68 y.o. female returns to the clinic today for follow-up visit. Discussed the use of AI scribe software for clinical note transcription with the patient, who gave verbal consent to proceed.  History of Present Illness [name] is a 68 year old female with extensive stage small cell lung cancer who presents for evaluation before starting cycle number twenty of maintenance chemotherapy.  She was diagnosed with extensive stage small cell lung cancer in March 2024 and has been undergoing palliative systemic chemo-immunotherapy with carboplatin , etoposide , and Imfinzi . Initially, she received treatment every three weeks for four cycles, and since cycle five, she has been on maintenance treatment with Imfinzi  1500 mg IV every four weeks. She has completed a total of nineteen cycles.  She feels okay today with no new complaints or side effects from the immunotherapy. No chest pain, breathing issues, nausea, vomiting, diarrhea, or headaches.  She also has a history of HIV, diagnosed  at the same time as her lung cancer in March 2024.  She is currently managed by infectious disease.     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  MEDICATIONS:  Current Outpatient Medications  Medication Sig Dispense Refill   clopidogrel  (PLAVIX ) 75 MG tablet Take 1 tablet (75 mg total) by mouth daily. 30 tablet 5   dolutegravir -lamiVUDine  (DOVATO ) 50-300 MG tablet Take 1 tablet by mouth daily. 30 tablet 11   doxycycline  (VIBRA -TABS) 100 MG tablet Take 1 tablet (100 mg total) by mouth 2 (two) times daily for 28 days. 56 tablet 0   No current facility-administered medications for this visit.    SURGICAL HISTORY:  Past Surgical History:  Procedure Laterality Date   BRONCHIAL NEEDLE ASPIRATION BIOPSY  09/02/2022   Procedure: BRONCHIAL NEEDLE ASPIRATION BIOPSIES;  Surgeon: Brenna Adine CROME, [name];  Location: [facility];  Service: Cardiopulmonary;;   IR IMAGING GUIDED PORT INSERTION  09/29/2022   MULTIPLE TOOTH EXTRACTIONS     ORIF ANKLE FRACTURE  05/03/2012   Procedure: OPEN REDUCTION INTERNAL FIXATION (ORIF) ANKLE FRACTURE;  Surgeon: Norleen Armor, MD;  Location: [facility];  Service: Orthopedics;  Laterality: Left;   VIDEO BRONCHOSCOPY WITH ENDOBRONCHIAL ULTRASOUND Bilateral 09/02/2022  Procedure: VIDEO BRONCHOSCOPY WITH ENDOBRONCHIAL ULTRASOUND;  Surgeon: Brenna Adine CROME, [name];  Location: [facility];  Service: Cardiopulmonary;  Laterality: Bilateral;    REVIEW OF SYSTEMS:  A comprehensive review of systems was negative.   PHYSICAL EXAMINATION: General appearance: alert, cooperative, and no distress Head: Normocephalic, without obvious abnormality, atraumatic Neck: no adenopathy, no JVD, supple, symmetrical, trachea  midline, and thyroid  not enlarged, symmetric, no tenderness/mass/nodules Lymph nodes: Cervical, supraclavicular, and axillary nodes normal. Resp: clear to auscultation bilaterally Back: symmetric, no curvature. ROM normal. No CVA tenderness. Cardio: regular rate and rhythm, S1, S2 normal, no murmur, click, rub or gallop GI: soft, non-tender; bowel sounds normal; no masses,  no organomegaly Extremities: extremities normal, atraumatic, no cyanosis or edema  ECOG PERFORMANCE STATUS: 1 - Symptomatic but completely ambulatory  Blood pressure 131/75, pulse 92, temperature 98.1 F (36.7 C), temperature source Temporal, resp. rate 17, height 5' 9 (1.753 m), weight 138 lb (62.6 kg), SpO2 100%.  LABORATORY DATA: Lab Results  Component Value Date   WBC 4.6 01/16/2024   HGB 13.0 01/16/2024   HCT 38.1 01/16/2024   MCV 98.2 01/16/2024   PLT 226 01/16/2024      Chemistry      Component Value Date/Time   NA 139 01/16/2024 0931   K 3.5 01/16/2024 0931   CL 105 01/16/2024 0931   CO2 29 01/16/2024 0931   BUN 17 01/16/2024 0931   CREATININE 0.80 01/16/2024 0931   CREATININE 0.53 09/21/2022 0942      Component Value Date/Time   CALCIUM 9.5 01/16/2024 0931   ALKPHOS 79 01/16/2024 0931   AST 18 01/16/2024 0931   ALT 13 01/16/2024 0931   BILITOT 0.8 01/16/2024 0931       RADIOGRAPHIC STUDIES: No results found.   ASSESSMENT AND PLAN: This is a very pleasant 68 years old African-American female with Extensive stage small cell lung cancer (T3, N3, M1 C) . She presented with large right lower lobe lung masses in addition to bulky right hilar and mediastinal lymphadenopathy as well as suspicious lytic bone lesion at L1. She was diagnosed in March 2024.  The patient also has HIV diagnosed in March 2024 She is currently undergoing systemic chemotherapy with carboplatin  for AUC of 4 on day 1 and etoposide  80 Mg/M2 on days 1, 2 and 3 with Imfinzi  1500 Mg on day 1 every 3 weeks with Neulasta  support  on day 5. First dose September 14, 2022 . She is on dose reduced chemotherapy due to her HIV. Status post 19 cycles.  Starting cycle #5 her treatment is with maintenance immunotherapy with Imfinzi  1500 Mg IV every 4 weeks.  She has been tolerating her treatment fairly well. Assessment and Plan Assessment & Plan Extensive stage small cell lung cancer on active systemic therapy Extensive stage small cell lung cancer diagnosed in March 2024. Currently on maintenance treatment with Imfinzi  1500 mg IV every four weeks, post total of 19 cycles. No new complaints or side effects from the immunotherapy. No chest pain, dyspnea, nausea, vomiting, diarrhea, or headaches reported. Blood work pending. - Administer cycle number 20 of Imfinzi1500 mg IV. - Order scan of chest and abdomen in three weeks. - Schedule follow-up appointment in four weeks. The patient was advised to call immediately if she has any concerning symptoms in the interval.  The patient voices understanding of current disease status and treatment options and is in agreement with the current care plan.  All questions were answered. The patient knows to call  the clinic with any problems, questions or concerns. We can certainly see the patient much sooner if necessary.  The total time spent in the appointment was 20 minutes.  Disclaimer: This note was dictated with voice recognition software. Similar sounding words can inadvertently be transcribed and may not be corrected upon review.

## 2024-02-14 LAB — T4: T4, Total: 4.5 ug/dL (ref 4.5–12.0)

## 2024-02-15 ENCOUNTER — Other Ambulatory Visit: Payer: Self-pay

## 2024-02-23 ENCOUNTER — Other Ambulatory Visit: Payer: Self-pay

## 2024-02-23 NOTE — Progress Notes (Signed)
 Specialty Pharmacy Refill Coordination Note  [name] is a 68 y.o. female contacted today regarding refills of specialty medication(s) Dolutegravir -lamiVUDine  (DOVATO )   Patient requested Delivery   Delivery date: 03/05/24   Verified address: [address] [place] [address]   Medication will be filled on 09.22.25.

## 2024-03-04 ENCOUNTER — Other Ambulatory Visit: Payer: Self-pay

## 2024-03-05 ENCOUNTER — Ambulatory Visit (HOSPITAL_COMMUNITY)
Admission: RE | Admit: 2024-03-05 | Discharge: 2024-03-05 | Disposition: A | Discharge status: Home / Self Care | Source: Ambulatory Visit | Attending: Internal Medicine | Admitting: Internal Medicine

## 2024-03-05 DIAGNOSIS — C3490 Malignant neoplasm of unspecified part of unspecified bronchus or lung: Secondary | ICD-10-CM | POA: Diagnosis not present

## 2024-03-05 DIAGNOSIS — I700 Atherosclerosis of aorta: Secondary | ICD-10-CM | POA: Diagnosis not present

## 2024-03-05 DIAGNOSIS — J439 Emphysema, unspecified: Secondary | ICD-10-CM | POA: Diagnosis not present

## 2024-03-05 DIAGNOSIS — K7689 Other specified diseases of liver: Secondary | ICD-10-CM | POA: Diagnosis not present

## 2024-03-05 MED ORDER — HEPARIN SOD (PORK) LOCK FLUSH 100 UNIT/ML IV SOLN
500.0000 [IU] | Freq: Once | INTRAVENOUS | Status: AC
  Administered 2024-03-05: 500 [IU] via INTRAVENOUS

## 2024-03-05 MED ORDER — HEPARIN SOD (PORK) LOCK FLUSH 100 UNIT/ML IV SOLN
INTRAVENOUS | Status: AC
  Filled 2024-03-05: qty 5

## 2024-03-05 MED ORDER — IOHEXOL 300 MG/ML  SOLN
100.0000 mL | Freq: Once | INTRAMUSCULAR | Status: AC | PRN
  Administered 2024-03-05: 100 mL via INTRAVENOUS

## 2024-03-06 NOTE — Progress Notes (Signed)
 [facility] OFFICE PROGRESS NOTE  [name], DO [address] [place] [address]  DIAGNOSIS:  1) Extensive stage small cell lung cancer (T3, N3, M1 C) . She presented with large right lower lobe lung masses in addition to bulky right hilar and mediastinal lymphadenopathy as well as suspicious lytic bone lesion at L1. She was diagnosed in March 2024.  2) HIV diagnosed in March 2024 follows with Dr. Efrain from infectious disease  PRIOR THERAPY: None  CURRENT THERAPY: Systemic chemotherapy with carboplatin  for AUC of 4 on day 1 and etoposide  80 Mg/M2 on days 1, 2 and 3 with Imfinzi  1500 Mg on day 1 every 3 weeks with Neulasta  support on day 5. First dose September 14, 2022 . She is on dose reduced chemotherapy due to her HIV. Status post 20 cycles. Starting from cycle #5, she started single agent maintenance immunotherapy with Imfinzi  1500 mg IV every 4 weeks.   INTERVAL HISTORY: ANGELYNN LEMUS 68 y.o. female returns to the clinic today for a follow up visit. The patient was last seen in the clinic 1 month ago by myself 1 month ago.  She is currently undergoing single agent maintenance immunotherapy with Imfinzi .  She is tolerating treatment well without any adverse side effects.  She reports intermittent left thumb stiffness and joint discomfort.  Her last immunotherapy treatment was well-tolerated with no new fevers, chills, or night sweats. Her appetite remains good, and she has gained a couple of pounds since her last visit. The patient denies shortness of breath with exertion.  She is experiencing a cough associated with a cold, persisting for the last three days. She has been using cough drops and previously used Tessalon  for relief. She requests Tylenol  for a headache attributed to the cold. Clear phlegm production is noted with the cough, and there is no hemoptysis. She has not been around anyone sick and has not checked for COVID or flu.  She reports persistent pain and  swelling in her thumb for about four months, which has not been discussed with her family doctor.   She denies any nausea, vomiting, diarrhea, or constipation. She denies any headache or visual changes.  She denies any rashes or skin changes. She recently had a restaging CT scan. Her visit today is for a toxicity check prior to undergoing cycle #21.   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  MEDICATIONS:  Current Outpatient Medications  Medication Sig Dispense Refill   clopidogrel  (PLAVIX ) 75 MG tablet Take 1 tablet (75 mg total) by mouth daily. 30 tablet 5   dolutegravir -lamiVUDine  (DOVATO ) 50-300 MG tablet Take 1 tablet by mouth daily. 30 tablet 11   No current facility-administered medications for this visit.    SURGICAL HISTORY:  Past Surgical History:  Procedure Laterality Date   BRONCHIAL NEEDLE ASPIRATION BIOPSY  09/02/2022   Procedure: BRONCHIAL NEEDLE ASPIRATION BIOPSIES;  Surgeon: Brenna Adine CROME, DO;  Location: [facility];  Service: Cardiopulmonary;;   IR IMAGING GUIDED PORT INSERTION  09/29/2022   MULTIPLE TOOTH EXTRACTIONS     ORIF ANKLE FRACTURE  05/03/2012   Procedure: OPEN REDUCTION INTERNAL FIXATION (ORIF) ANKLE FRACTURE;  Surgeon: Norleen Armor,  MD;  Location: [facility];  Service: Orthopedics;  Laterality: Left;   VIDEO BRONCHOSCOPY WITH ENDOBRONCHIAL ULTRASOUND Bilateral 09/02/2022   Procedure: VIDEO BRONCHOSCOPY WITH ENDOBRONCHIAL ULTRASOUND;  Surgeon: Brenna Adine CROME, DO;  Location: [facility];  Service: Cardiopulmonary;  Laterality: Bilateral;    REVIEW OF SYSTEMS:   Constitutional: Negative for appetite change, chills, fatigue, fever and unexpected weight change.  HENT: Positive for nasal  congestion. Negative for mouth sores, nosebleeds, sore throat and trouble swallowing.   Eyes: Negative for eye problems and icterus.  Respiratory: Positive for cough. Negative for hemoptysis, shortness of breath and wheezing.   Cardiovascular: Negative for chest pain and leg swelling.  Gastrointestinal: Negative for abdominal pain, constipation, diarrhea, nausea and vomiting.  Genitourinary: Negative for bladder incontinence, difficulty urinating, dysuria, frequency and hematuria.   Musculoskeletal: Positive for left thumb pain. Negative for back pain, gait problem, neck pain and neck stiffness.  Skin: Negative for itching and rash.  Neurological: Negative for dizziness, extremity weakness, gait problem, headaches, light-headedness and seizures.  Hematological: Negative for adenopathy. Does not bruise/bleed easily.  Psychiatric/Behavioral: Negative for confusion, depression and sleep disturbance. The patient is not nervous/anxious.     PHYSICAL EXAMINATION:  There were no vitals taken for this visit.  ECOG PERFORMANCE STATUS: 1  Physical Exam  Constitutional: Oriented to person, place, and time and thin appearing female and in no distress. HENT:  Head: Normocephalic and atraumatic.  Mouth/Throat: Oropharynx is clear and moist. No oropharyngeal exudate.  Eyes: Conjunctivae are normal. Right eye exhibits no discharge. Left eye exhibits no discharge. No scleral icterus.  Neck: Normal range of motion. Neck supple.  Cardiovascular: Normal rate, regular rhythm, normal heart sounds and intact distal pulses.   Pulmonary/Chest: Effort normal and breath sounds normal. No respiratory distress. No wheezes. No rales.  Abdominal: Soft. Bowel sounds are normal. Exhibits no distension and no mass. There is no tenderness.  Musculoskeletal: Normal range of motion. Exhibits no edema.  Lymphadenopathy:    No cervical adenopathy.  Neurological: Alert and oriented to person, place, and time. Exhibits normal  muscle tone. Gait normal. Coordination normal.  Skin: Skin is warm and dry. No rash noted. Not diaphoretic. No erythema. No pallor.  Psychiatric: Mood, memory and judgment normal.  Vitals reviewed.  LABORATORY DATA: Lab Results  Component Value Date   WBC 4.7 02/13/2024   HGB 11.7 (L) 02/13/2024   HCT 35.6 (L) 02/13/2024   MCV 100.6 (H) 02/13/2024   PLT 227 02/13/2024      Chemistry      Component Value Date/Time   NA 140 02/13/2024 0932   K 3.8 02/13/2024 0932   CL 110 02/13/2024 0932   CO2 26 02/13/2024 0932   BUN 14 02/13/2024 0932   CREATININE 0.81 02/13/2024 0932   CREATININE 0.53 09/21/2022 0942      Component Value Date/Time   CALCIUM 9.2 02/13/2024 0932   ALKPHOS 66 02/13/2024 0932   AST 13 (L) 02/13/2024 0932   ALT 7 02/13/2024 0932   BILITOT 0.5 02/13/2024 0932       RADIOGRAPHIC STUDIES:  No results found.   ASSESSMENT/PLAN:  This is a very pleasant 68 year old female with extensive stage small cell lung cancer (T3, N3, M1 C) . She presented with large right lower lobe lung masses in addition to bulky right hilar and mediastinal lymphadenopathy as well as suspicious lytic bone lesion at L1. She was diagnosed in March 2024.    She is currently on systemic chemotherapy with carboplatin  for AUC  of 4 on day 1 and etoposide  80 Mg/M2 on days 1, 2 and 3 with Neulasta  support on day 5 as well as Imfinzi  1500 Mg IV on day 1 every 3 weeks. She had reduced dose of carboplatin  and etoposide  side because of her HIV status.  Starting with cycle #5, she started single agent maintenance immunotherapy with Imfinzi  1500 mg IV every 4 weeks she is status post 20 cycles and tolerated it well.   The patient was seen with Dr. Sherrod today.  Dr. Sherrod personally and independently reviewed the scan and discussed results with the patient today.  The scan showed no disease progression except some micronodularity which can be infectious or inflammatory.  Dr. Sherrod recommends  sending a prescription for azithromycin  and continuing with immunotherapy as scheduled.   I have refilled her tessalon . She also requested prescription for tylenol .   Labs were reviewed. Recommend she proceed with cycle #21 today as scheduled.  She will use tylenol  of her thrumb or OTV voltaren  gel.    We will see her back for labs and follow up in 4 weeks for evaluation and repeat blood work before undergoing cycle #22.   The patient was advised to call immediately if she has any concerning symptoms in the interval. The patient voices understanding of current disease status and treatment options and is in agreement with the current care plan. All questions were answered. The patient knows to call the clinic with any problems, questions or concerns. We can certainly see the patient much sooner if necessary    No orders of the defined types were placed in this encounter.    Tyjai Charbonnet L Tam[place]u Wiederholt, PA-C 03/06/24  ADDENDUM: Hematology/Oncology Attending:  I had a face-to-face encounter with the patient today.  I reviewed her records, lab, scan and recommended her care plan.  This is a very pleasant 68 years old African-American female diagnosed with extensive stage small cell lung cancer in March 2024.  She also has a history of HIV.  She started palliative systemic chemoimmunotherapy with carboplatin , etoposide  and durvalumab  every 3 weeks for 4 cycles and starting from cycle #5 she has been on maintenance treatment with single agent durvalumab  every 4 weeks status post a total of 20 cycles.  She has been tolerating her treatment fairly well but she has chest congestion recently with flulike symptoms. She had repeat CT scan of the chest, abdomen and pelvis performed recently.  I personally independently reviewed the scan and discussed the results with the patient today.  Her scan showed no concerning findings for disease progression except for the multi micronodularity that suspicious for  the inflammatory process. I recommended for her to proceed with cycle #20 one of her treatment with durvalumab  today as planned. For the flulike symptoms and bronchitis, I will give her prescription for Z-Pak because of her HIV status and immunocompromise state. The patient will come back for follow-up visit in 4 weeks for evaluation before the next cycle of her treatment. She was advised to call immediately if she has any other concerning symptoms in the interval. The total time spent in the appointment was 30 minutes including review of chart and various tests results, discussions about plan of care and coordination of care plan . Disclaimer: This note was dictated with voice recognition software. Similar sounding words can inadvertently be transcribed and may be missed upon review. Sherrod MARLA Sherrod, MD

## 2024-03-07 DIAGNOSIS — R0600 Dyspnea, unspecified: Secondary | ICD-10-CM | POA: Diagnosis not present

## 2024-03-12 ENCOUNTER — Inpatient Hospital Stay: Admitting: Physician Assistant

## 2024-03-12 ENCOUNTER — Inpatient Hospital Stay

## 2024-03-12 ENCOUNTER — Other Ambulatory Visit: Payer: Self-pay

## 2024-03-12 ENCOUNTER — Encounter: Payer: Self-pay | Admitting: Internal Medicine

## 2024-03-12 ENCOUNTER — Encounter: Payer: Self-pay | Admitting: Physician Assistant

## 2024-03-12 ENCOUNTER — Other Ambulatory Visit (HOSPITAL_COMMUNITY): Payer: Self-pay

## 2024-03-12 VITALS — BP 137/54 | HR 87 | Temp 98.2°F | Resp 18

## 2024-03-12 VITALS — BP 119/74 | HR 92 | Temp 97.9°F | Resp 17 | Ht 69.0 in | Wt 141.0 lb

## 2024-03-12 DIAGNOSIS — C3431 Malignant neoplasm of lower lobe, right bronchus or lung: Secondary | ICD-10-CM | POA: Diagnosis not present

## 2024-03-12 DIAGNOSIS — J40 Bronchitis, not specified as acute or chronic: Secondary | ICD-10-CM | POA: Diagnosis not present

## 2024-03-12 DIAGNOSIS — M899 Disorder of bone, unspecified: Secondary | ICD-10-CM | POA: Diagnosis not present

## 2024-03-12 DIAGNOSIS — Z5112 Encounter for antineoplastic immunotherapy: Secondary | ICD-10-CM | POA: Diagnosis not present

## 2024-03-12 DIAGNOSIS — R590 Localized enlarged lymph nodes: Secondary | ICD-10-CM | POA: Diagnosis not present

## 2024-03-12 DIAGNOSIS — Z79620 Long term (current) use of immunosuppressive biologic: Secondary | ICD-10-CM | POA: Diagnosis not present

## 2024-03-12 LAB — CMP (CANCER CENTER ONLY)
ALT: 8 U/L (ref 0–44)
AST: 12 U/L — ABNORMAL LOW (ref 15–41)
Albumin: 3.9 g/dL (ref 3.5–5.0)
Alkaline Phosphatase: 64 U/L (ref 38–126)
Anion gap: 3 — ABNORMAL LOW (ref 5–15)
BUN: 14 mg/dL (ref 8–23)
CO2: 25 mmol/L (ref 22–32)
Calcium: 9.3 mg/dL (ref 8.9–10.3)
Chloride: 107 mmol/L (ref 98–111)
Creatinine: 0.7 mg/dL (ref 0.44–1.00)
GFR, Estimated: >60 mL/min (ref >60)
Glucose, Bld: 99 mg/dL (ref 70–99)
Potassium: 3.7 mmol/L (ref 3.5–5.1)
Sodium: 135 mmol/L (ref 135–145)
Total Bilirubin: 0.3 mg/dL (ref 0.0–1.2)
Total Protein: 7.9 g/dL (ref 6.5–8.1)

## 2024-03-12 LAB — CBC WITH DIFFERENTIAL (CANCER CENTER ONLY)
Abs Immature Granulocytes: 0.01 K/uL (ref 0.00–0.07)
Basophils Absolute: 0 K/uL (ref 0.0–0.1)
Basophils Relative: 1 %
Eosinophils Absolute: 0.1 K/uL (ref 0.0–0.5)
Eosinophils Relative: 2 %
HCT: 34.6 % — ABNORMAL LOW (ref 36.0–46.0)
Hemoglobin: 11.4 g/dL — ABNORMAL LOW (ref 12.0–15.0)
Immature Granulocytes: 0 %
Lymphocytes Relative: 26 %
Lymphs Abs: 1 K/uL (ref 0.7–4.0)
MCH: 32.4 pg (ref 26.0–34.0)
MCHC: 32.9 g/dL (ref 30.0–36.0)
MCV: 98.3 fL (ref 80.0–100.0)
Monocytes Absolute: 0.4 K/uL (ref 0.1–1.0)
Monocytes Relative: 9 %
Neutro Abs: 2.4 K/uL (ref 1.7–7.7)
Neutrophils Relative %: 62 %
Platelet Count: 253 K/uL (ref 150–400)
RBC: 3.52 MIL/uL — ABNORMAL LOW (ref 3.87–5.11)
RDW: 11.9 % (ref 11.5–15.5)
WBC Count: 3.8 K/uL — ABNORMAL LOW (ref 4.0–10.5)
nRBC: 0 % (ref 0.0–0.2)

## 2024-03-12 MED ORDER — SODIUM CHLORIDE 0.9 % IV SOLN: Freq: Once | INTRAVENOUS | Status: AC

## 2024-03-12 MED ORDER — SODIUM CHLORIDE 0.9 % IV SOLN
1500.0000 mg | Freq: Once | INTRAVENOUS | Status: AC
  Administered 2024-03-12: 1500 mg via INTRAVENOUS
  Filled 2024-03-12: qty 30

## 2024-03-12 MED ORDER — AZITHROMYCIN 250 MG PO TABS
ORAL_TABLET | ORAL | 0 refills | Status: DC
  Filled 2024-03-12: qty 6, 5d supply, fill #0

## 2024-03-12 MED ORDER — ACETAMINOPHEN 325 MG PO TABS
325.0000 mg | ORAL_TABLET | Freq: Four times a day (QID) | ORAL | 0 refills | Status: AC
  Filled 2024-03-12: qty 120, 30d supply, fill #0

## 2024-03-12 MED ORDER — BENZONATATE 100 MG PO CAPS
100.0000 mg | ORAL_CAPSULE | Freq: Three times a day (TID) | ORAL | 2 refills | Status: DC | PRN
  Filled 2024-03-12 – 2024-04-12 (×2): qty 30, 10d supply, fill #0

## 2024-03-12 NOTE — Patient Instructions (Signed)

## 2024-03-13 ENCOUNTER — Other Ambulatory Visit: Payer: Self-pay

## 2024-03-18 ENCOUNTER — Other Ambulatory Visit: Payer: Self-pay

## 2024-03-22 ENCOUNTER — Other Ambulatory Visit: Payer: Self-pay

## 2024-03-22 NOTE — Progress Notes (Signed)
 Specialty Pharmacy Refill Coordination Note  [name] is a 68 y.o. female contacted today regarding refills of specialty medication(s) Dolutegravir -lamiVUDine  (DOVATO )   Patient requested Delivery   Delivery date: 03/29/24   Verified address: [address] [place] [address]   Medication will be filled on 10.16.25.

## 2024-03-28 ENCOUNTER — Other Ambulatory Visit: Payer: Self-pay

## 2024-04-04 DIAGNOSIS — R0600 Dyspnea, unspecified: Secondary | ICD-10-CM | POA: Diagnosis not present

## 2024-04-06 DIAGNOSIS — R0600 Dyspnea, unspecified: Secondary | ICD-10-CM | POA: Diagnosis not present

## 2024-04-08 ENCOUNTER — Other Ambulatory Visit: Payer: Self-pay | Admitting: *Deleted

## 2024-04-08 DIAGNOSIS — C3431 Malignant neoplasm of lower lobe, right bronchus or lung: Secondary | ICD-10-CM

## 2024-04-09 ENCOUNTER — Inpatient Hospital Stay

## 2024-04-09 ENCOUNTER — Inpatient Hospital Stay: Admitting: Internal Medicine

## 2024-04-10 ENCOUNTER — Other Ambulatory Visit: Payer: Self-pay | Admitting: Physician Assistant

## 2024-04-10 ENCOUNTER — Encounter: Payer: Self-pay | Admitting: Internal Medicine

## 2024-04-10 DIAGNOSIS — C3431 Malignant neoplasm of lower lobe, right bronchus or lung: Secondary | ICD-10-CM

## 2024-04-11 ENCOUNTER — Other Ambulatory Visit (HOSPITAL_COMMUNITY): Payer: Self-pay

## 2024-04-11 ENCOUNTER — Other Ambulatory Visit: Payer: Self-pay | Admitting: Internal Medicine

## 2024-04-11 ENCOUNTER — Other Ambulatory Visit: Payer: Self-pay

## 2024-04-11 DIAGNOSIS — C3431 Malignant neoplasm of lower lobe, right bronchus or lung: Secondary | ICD-10-CM

## 2024-04-11 NOTE — Progress Notes (Unsigned)
 [facility] OFFICE PROGRESS NOTE  [name], DO [address] [place] [address]  DIAGNOSIS:  1) Extensive stage small cell lung cancer (T3, N3, M1 C) . She presented with large right lower lobe lung masses in addition to bulky right hilar and mediastinal lymphadenopathy as well as suspicious lytic bone lesion at L1. She was diagnosed in March 2024.  2) HIV diagnosed in March 2024 follows with Dr. Efrain from infectious disease  PRIOR THERAPY: None  CURRENT THERAPY: Systemic chemotherapy with carboplatin  for AUC of 4 on day 1 and etoposide  80 Mg/M2 on days 1, 2 and 3 with Imfinzi  1500 Mg on day 1 every 3 weeks with Neulasta  support on day 5. First dose September 14, 2022 . She is on dose reduced chemotherapy due to her HIV. Status post 21 cycles. Starting from cycle #5, she started single agent maintenance immunotherapy with Imfinzi  1500 mg IV every 4 weeks.   INTERVAL HISTORY: [name] 68 y.o. female returns to the clinic today for a follow up visit. The patient was last seen in the clinic 1 month ago by myself.   She is currently undergoing single agent maintenance immunotherapy with Imfinzi . She is tolerating treatment well without any adverse side effects.   Her last immunotherapy treatment was well-tolerated with no new fevers, chills, or night sweats. Her appetite remains good, and she has gained a couple of pounds since her last visit. The patient denies shortness of breath with exertion.   ***Cough, tessalon . Clear phlegm production is noted with the cough, and there is no hemoptysis.   ***Last apt c/o swelling in the thumb.   She denies any nausea, vomiting, diarrhea, or constipation. She denies any headache or visual changes. She denies any rashes or skin changes. Her visit today is for a toxicity check prior to undergoing cycle #22.  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  MEDICATIONS:  Current Outpatient Medications  Medication Sig Dispense Refill   acetaminophen  (RA ACETAMINOPHEN ) 325 MG tablet Take 1 tablet (325 mg total) by mouth in the morning, at noon, in the evening, and at bedtime. 120 tablet 0   azithromycin  (ZITHROMAX  Z-PAK) 250 MG tablet Take as directed 6 each 0   benzonatate  (TESSALON ) 100 MG capsule Take 1 capsule (100 mg total) by mouth 3 (three) times daily as needed. 30 capsule 2   clopidogrel  (PLAVIX ) 75 MG tablet Take 1 tablet (75 mg total) by mouth daily. 30 tablet 5   dolutegravir -lamiVUDine  (DOVATO ) 50-300 MG tablet Take 1 tablet by mouth daily. 30 tablet 11   No current facility-administered medications for this visit.    SURGICAL HISTORY:  Past Surgical History:  Procedure Laterality Date   BRONCHIAL NEEDLE ASPIRATION BIOPSY  09/02/2022   Procedure: BRONCHIAL NEEDLE ASPIRATION BIOPSIES;  Surgeon: Brenna Adine CROME, DO;  Location: [facility];  Service: Cardiopulmonary;;   IR IMAGING GUIDED PORT INSERTION  09/29/2022   MULTIPLE TOOTH EXTRACTIONS     ORIF ANKLE FRACTURE  05/03/2012   Procedure: OPEN REDUCTION INTERNAL FIXATION (ORIF) ANKLE FRACTURE;  Surgeon: Norleen Armor, MD;  Location: [facility];  Service: Orthopedics;  Laterality: Left;   VIDEO BRONCHOSCOPY WITH  ENDOBRONCHIAL ULTRASOUND Bilateral 09/02/2022   Procedure: VIDEO BRONCHOSCOPY WITH ENDOBRONCHIAL ULTRASOUND;  Surgeon: Brenna Adine CROME, DO;  Location: [facility];  Service: Cardiopulmonary;  Laterality: Bilateral;    REVIEW OF SYSTEMS:   Review of Systems  Constitutional: Negative for appetite change, chills, fatigue, fever and unexpected weight change.  HENT:   Negative for mouth sores, nosebleeds, sore throat and trouble swallowing.   Eyes: Negative for eye problems and icterus.   Respiratory: Negative for cough, hemoptysis, shortness of breath and wheezing.   Cardiovascular: Negative for chest pain and leg swelling.  Gastrointestinal: Negative for abdominal pain, constipation, diarrhea, nausea and vomiting.  Genitourinary: Negative for bladder incontinence, difficulty urinating, dysuria, frequency and hematuria.   Musculoskeletal: Negative for back pain, gait problem, neck pain and neck stiffness.  Skin: Negative for itching and rash.  Neurological: Negative for dizziness, extremity weakness, gait problem, headaches, light-headedness and seizures.  Hematological: Negative for adenopathy. Does not bruise/bleed easily.  Psychiatric/Behavioral: Negative for confusion, depression and sleep disturbance. The patient is not nervous/anxious.     PHYSICAL EXAMINATION:  There were no vitals taken for this visit.  ECOG PERFORMANCE STATUS: {CHL ONC ECOG [id number]  Physical Exam  Constitutional: Oriented to person, place, and time and well-developed, well-nourished, and in no distress. No distress.  HENT:  Head: Normocephalic and atraumatic.  Mouth/Throat: Oropharynx is clear and moist. No oropharyngeal exudate.  Eyes: Conjunctivae are normal. Right eye exhibits no discharge. Left eye exhibits no discharge. No scleral icterus.  Neck: Normal range of motion. Neck supple.  Cardiovascular: Normal rate, regular rhythm, normal heart sounds and intact distal pulses.   Pulmonary/Chest: Effort normal and breath sounds normal. No respiratory distress. No wheezes. No rales.  Abdominal: Soft. Bowel sounds are normal. Exhibits no distension and no mass. There is no tenderness.  Musculoskeletal: Normal range of motion. Exhibits no edema.  Lymphadenopathy:    No cervical adenopathy.  Neurological: Alert and oriented to person, place, and time. Exhibits normal muscle tone. Gait normal. Coordination normal.  Skin: Skin is warm and dry. No rash noted. Not diaphoretic. No erythema. No  pallor.  Psychiatric: Mood, memory and judgment normal.  Vitals reviewed.  LABORATORY DATA: Lab Results  Component Value Date   WBC 3.8 (L) 03/12/2024   HGB 11.4 (L) 03/12/2024   HCT 34.6 (L) 03/12/2024   MCV 98.3 03/12/2024   PLT 253 03/12/2024      Chemistry      Component Value Date/Time   NA 135 03/12/2024 0843   K 3.7 03/12/2024 0843   CL 107 03/12/2024 0843   CO2 25 03/12/2024 0843   BUN 14 03/12/2024 0843   CREATININE 0.70 03/12/2024 0843   CREATININE 0.53 09/21/2022 0942      Component Value Date/Time   CALCIUM 9.3 03/12/2024 0843   ALKPHOS 64 03/12/2024 0843   AST 12 (L) 03/12/2024 0843   ALT 8 03/12/2024 0843   BILITOT 0.3 03/12/2024 0843       RADIOGRAPHIC STUDIES:  No results found.   ASSESSMENT/PLAN:  This is a very pleasant 68 year old female with extensive stage small cell lung cancer (T3, N3, M1 C) . She presented with large right lower lobe lung masses in addition to bulky right hilar and mediastinal lymphadenopathy as well as suspicious lytic bone lesion at L1. She was diagnosed in March 2024.    She is currently on systemic chemotherapy with carboplatin  for AUC of 4 on day 1 and etoposide  80 Mg/M2 on days 1, 2 and 3 with  Neulasta  support on day 5 as well as Imfinzi  1500 Mg IV on day 1 every 3 weeks. She had reduced dose of carboplatin  and etoposide  side because of her HIV status.  Starting with cycle #5, she started single agent maintenance immunotherapy with Imfinzi  1500 mg IV every 4 weeks she is status post 21 cycles and tolerated it well.    Labs were reviewed. Recommend she proceed with cycle #22 today as scheduled.     We will see her back for labs and follow up in 4 weeks for evaluation and repeat blood work before undergoing cycle #23.  The patient was advised to call immediately if she has any concerning symptoms in the interval. The patient voices understanding of current disease status and treatment options and is in agreement with the  current care plan. All questions were answered. The patient knows to call the clinic with any problems, questions or concerns. We can certainly see the patient much sooner if necessary     No orders of the defined types were placed in this encounter.    I spent {CHL ONC TIME VISIT - DTPQU:[id number]} counseling the patient face to face. The total time spent in the appointment was {CHL ONC TIME VISIT - DTPQU:[id number]}.  Nahara Dona L Brelynn Wheller, PA-C 04/11/24

## 2024-04-12 ENCOUNTER — Other Ambulatory Visit (HOSPITAL_COMMUNITY): Payer: Self-pay

## 2024-04-12 ENCOUNTER — Other Ambulatory Visit: Payer: Self-pay

## 2024-04-12 ENCOUNTER — Inpatient Hospital Stay: Attending: Internal Medicine | Admitting: Physician Assistant

## 2024-04-12 ENCOUNTER — Inpatient Hospital Stay

## 2024-04-12 ENCOUNTER — Other Ambulatory Visit: Payer: Self-pay | Admitting: Physician Assistant

## 2024-04-12 VITALS — BP 124/68 | HR 94 | Temp 97.2°F | Resp 15 | Ht 69.0 in | Wt 141.5 lb

## 2024-04-12 DIAGNOSIS — Z21 Asymptomatic human immunodeficiency virus [HIV] infection status: Secondary | ICD-10-CM | POA: Diagnosis not present

## 2024-04-12 DIAGNOSIS — Z5112 Encounter for antineoplastic immunotherapy: Secondary | ICD-10-CM | POA: Insufficient documentation

## 2024-04-12 DIAGNOSIS — B20 Human immunodeficiency virus [HIV] disease: Secondary | ICD-10-CM

## 2024-04-12 DIAGNOSIS — Z79620 Long term (current) use of immunosuppressive biologic: Secondary | ICD-10-CM | POA: Diagnosis not present

## 2024-04-12 DIAGNOSIS — C3431 Malignant neoplasm of lower lobe, right bronchus or lung: Secondary | ICD-10-CM | POA: Insufficient documentation

## 2024-04-12 DIAGNOSIS — M899 Disorder of bone, unspecified: Secondary | ICD-10-CM | POA: Diagnosis not present

## 2024-04-12 DIAGNOSIS — J329 Chronic sinusitis, unspecified: Secondary | ICD-10-CM

## 2024-04-12 DIAGNOSIS — R0981 Nasal congestion: Secondary | ICD-10-CM | POA: Diagnosis not present

## 2024-04-12 DIAGNOSIS — R059 Cough, unspecified: Secondary | ICD-10-CM | POA: Diagnosis not present

## 2024-04-12 LAB — CMP (CANCER CENTER ONLY)
ALT: 10 U/L (ref 0–44)
AST: 15 U/L (ref 15–41)
Albumin: 4.1 g/dL (ref 3.5–5.0)
Alkaline Phosphatase: 72 U/L (ref 38–126)
Anion gap: 6 (ref 5–15)
BUN: 9 mg/dL (ref 8–23)
CO2: 25 mmol/L (ref 22–32)
Calcium: 9.4 mg/dL (ref 8.9–10.3)
Chloride: 106 mmol/L (ref 98–111)
Creatinine: 0.72 mg/dL (ref 0.44–1.00)
GFR, Estimated: >60 mL/min (ref >60)
Glucose, Bld: 82 mg/dL (ref 70–99)
Potassium: 3.8 mmol/L (ref 3.5–5.1)
Sodium: 137 mmol/L (ref 135–145)
Total Bilirubin: 0.4 mg/dL (ref 0.0–1.2)
Total Protein: 8.1 g/dL (ref 6.5–8.1)

## 2024-04-12 LAB — CBC WITH DIFFERENTIAL (CANCER CENTER ONLY)
Abs Immature Granulocytes: 0.01 K/uL (ref 0.00–0.07)
Basophils Absolute: 0 K/uL (ref 0.0–0.1)
Basophils Relative: 1 %
Eosinophils Absolute: 0.2 K/uL (ref 0.0–0.5)
Eosinophils Relative: 4 %
HCT: 34.5 % — ABNORMAL LOW (ref 36.0–46.0)
Hemoglobin: 11.8 g/dL — ABNORMAL LOW (ref 12.0–15.0)
Immature Granulocytes: 0 %
Lymphocytes Relative: 29 %
Lymphs Abs: 1.3 K/uL (ref 0.7–4.0)
MCH: 33.4 pg (ref 26.0–34.0)
MCHC: 34.2 g/dL (ref 30.0–36.0)
MCV: 97.7 fL (ref 80.0–100.0)
Monocytes Absolute: 0.3 K/uL (ref 0.1–1.0)
Monocytes Relative: 7 %
Neutro Abs: 2.7 K/uL (ref 1.7–7.7)
Neutrophils Relative %: 59 %
Platelet Count: 222 K/uL (ref 150–400)
RBC: 3.53 MIL/uL — ABNORMAL LOW (ref 3.87–5.11)
RDW: 12.3 % (ref 11.5–15.5)
WBC Count: 4.6 K/uL (ref 4.0–10.5)
nRBC: 0 % (ref 0.0–0.2)

## 2024-04-12 LAB — TSH: TSH: 0.48 u[IU]/mL (ref 0.350–4.500)

## 2024-04-12 MED ORDER — AZITHROMYCIN 250 MG PO TABS
ORAL_TABLET | ORAL | 0 refills | Status: AC
  Filled 2024-04-12: qty 6, 5d supply, fill #0

## 2024-04-12 MED ORDER — AZITHROMYCIN 250 MG PO TABS
ORAL_TABLET | ORAL | 0 refills | Status: DC
  Filled 2024-04-12: qty 6, fill #0

## 2024-04-12 MED ORDER — SODIUM CHLORIDE 0.9 % IV SOLN: Freq: Once | INTRAVENOUS | Status: AC

## 2024-04-12 MED ORDER — DEXTROMETHORPHAN HBR 15 MG/5ML PO SYRP
10.0000 mL | ORAL_SOLUTION | Freq: Three times a day (TID) | ORAL | 0 refills | Status: AC | PRN
  Filled 2024-04-12: qty 120, 4d supply, fill #0

## 2024-04-12 MED ORDER — SODIUM CHLORIDE 0.9 % IV SOLN
1500.0000 mg | Freq: Once | INTRAVENOUS | Status: AC
  Administered 2024-04-12: 1500 mg via INTRAVENOUS
  Filled 2024-04-12: qty 30

## 2024-04-12 NOTE — Patient Instructions (Signed)

## 2024-04-13 LAB — T4: T4, Total: 4.6 ug/dL (ref 4.5–12.0)

## 2024-04-15 ENCOUNTER — Other Ambulatory Visit: Payer: Self-pay

## 2024-04-16 ENCOUNTER — Other Ambulatory Visit: Payer: Self-pay | Admitting: *Deleted

## 2024-04-16 ENCOUNTER — Telehealth: Payer: Self-pay | Admitting: *Deleted

## 2024-04-16 ENCOUNTER — Encounter: Payer: Self-pay | Admitting: Internal Medicine

## 2024-04-16 ENCOUNTER — Encounter: Payer: Self-pay | Admitting: Physician Assistant

## 2024-04-16 ENCOUNTER — Other Ambulatory Visit (HOSPITAL_COMMUNITY): Payer: Self-pay

## 2024-04-16 ENCOUNTER — Other Ambulatory Visit: Payer: Self-pay

## 2024-04-16 DIAGNOSIS — C3431 Malignant neoplasm of lower lobe, right bronchus or lung: Secondary | ICD-10-CM

## 2024-04-16 MED ORDER — BENZONATATE 100 MG PO CAPS
100.0000 mg | ORAL_CAPSULE | Freq: Three times a day (TID) | ORAL | 2 refills | Status: AC | PRN
Start: 2024-04-16 — End: ?
  Filled 2024-04-16: qty 30, 10d supply, fill #0

## 2024-04-16 NOTE — Telephone Encounter (Signed)
 Pt. called and states Tessalon  medication was not received by pharmacy. Tessalon  re-ordered to [facility].

## 2024-04-17 ENCOUNTER — Other Ambulatory Visit: Payer: Self-pay

## 2024-04-19 ENCOUNTER — Other Ambulatory Visit (HOSPITAL_COMMUNITY): Payer: Self-pay

## 2024-04-19 ENCOUNTER — Ambulatory Visit: Admitting: Family Medicine

## 2024-04-19 ENCOUNTER — Other Ambulatory Visit: Payer: Self-pay

## 2024-04-19 NOTE — Progress Notes (Deleted)
   New Patient Office Visit  Subjective    Patient ID: [name], female    DOB: 21-Mar-1956  Age: 68 y.o. MRN: [id number]  CC: No chief complaint on file.   HPI [name] presents to establish care.  ***  History PMHx:  - HIV+ on Dovato  since March 2024 *** - Small cell lung cancer - h/o stroke - Tobacco use  Surgical Hx:   Family Hx:   Medications:     ROS      Objective    There were no vitals taken for this visit.  Physical Exam  {Labs (Optional):23779}    Assessment & Plan:   Problem List Items Addressed This Visit   None   No follow-ups on file.   Lauraine Norse, DO

## 2024-04-21 ENCOUNTER — Other Ambulatory Visit: Payer: Self-pay

## 2024-04-22 ENCOUNTER — Other Ambulatory Visit: Payer: Self-pay

## 2024-04-24 ENCOUNTER — Other Ambulatory Visit: Payer: Self-pay | Admitting: Internal Medicine

## 2024-04-24 ENCOUNTER — Other Ambulatory Visit: Payer: Self-pay

## 2024-04-24 NOTE — Progress Notes (Signed)
 Specialty Pharmacy Refill Coordination Note  [name] is a 68 y.o. female contacted today regarding refills of specialty medication(s) Dolutegravir -lamiVUDine  (DOVATO )   Patient requested Delivery   Delivery date: 05/03/24   Verified address: [address] [place]  [address]   Medication will be filled on: 05/02/24

## 2024-04-25 ENCOUNTER — Other Ambulatory Visit: Payer: Self-pay

## 2024-04-25 ENCOUNTER — Other Ambulatory Visit (HOSPITAL_COMMUNITY): Payer: Self-pay

## 2024-04-25 ENCOUNTER — Encounter: Payer: Self-pay | Admitting: Physician Assistant

## 2024-04-25 ENCOUNTER — Encounter: Payer: Self-pay | Admitting: Internal Medicine

## 2024-04-25 MED ORDER — CLOPIDOGREL BISULFATE 75 MG PO TABS
75.0000 mg | ORAL_TABLET | Freq: Every day | ORAL | 5 refills | Status: AC
  Filled 2024-04-25: qty 30, 30d supply, fill #0
  Filled 2024-06-01 – 2024-06-10 (×2): qty 30, 30d supply, fill #1
  Filled 2024-07-05 – 2024-07-16 (×2): qty 30, 30d supply, fill #2

## 2024-05-09 NOTE — Progress Notes (Signed)
 [facility] OFFICE PROGRESS NOTE  [name], DO [address] [place] [address]  DIAGNOSIS:  1) Extensive stage small cell lung cancer (T3, N3, M1 C) . She presented with large right lower lobe lung masses in addition to bulky right hilar and mediastinal lymphadenopathy as well as suspicious lytic bone lesion at L1. She was diagnosed in March 2024.  2) HIV diagnosed in March 2024 follows with Dr. Efrain from infectious disease  PRIOR THERAPY: None  CURRENT THERAPY: Systemic chemotherapy with carboplatin  for AUC of 4 on day 1 and etoposide  80 Mg/M2 on days 1, 2 and 3 with Imfinzi  1500 Mg on day 1 every 3 weeks with Neulasta  support on day 5. First dose September 14, 2022 . She is on dose reduced chemotherapy due to her HIV. Status post 21 cycles. Starting from cycle #5, she started single agent maintenance immunotherapy with Imfinzi  1500 mg IV every 4 weeks.   INTERVAL HISTORY: [name] 68 y.o. female returns to the clinic today for a follow up visit while undergoing single agent maintenance immunotherapy with Imfinzi . She is unaccompanied for this visit.   [name] reports she is doing well without any new or concerning symptoms.  She reports her energy and appetite are overall stable.  She is able to complete her daily activities without issues.  She denies nausea, vomiting or bowel habit changes.  She denies easy bruising or overt signs of bleeding such as hematochezia or melena.  Patient denies fevers, chills, night sweats, shortness of breath, chest pain.  She has no other complaints.  Rest of the 10 point ROS as below.    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  MEDICATIONS:  Current  Outpatient Medications  Medication Sig Dispense Refill   acetaminophen  (RA ACETAMINOPHEN ) 325 MG tablet Take 1 tablet (325 mg total) by mouth in the morning, at noon, in the evening, and at bedtime. 120 tablet 0   azithromycin  (ZITHROMAX  Z-PAK) 250 MG tablet Take as directed 6 each 0   benzonatate  (TESSALON ) 100 MG capsule Take 1 capsule (100 mg total) by mouth 3 (three) times daily as needed. 30 capsule 2   clopidogrel  (PLAVIX ) 75 MG tablet Take 1 tablet (75 mg total) by mouth daily. 30 tablet 5   dextromethorphan  15 MG/5ML syrup Take 10 mLs (30 mg total) by mouth 3 (three) times daily as needed for cough. 120 mL 0   dolutegravir -lamiVUDine  (DOVATO ) 50-300 MG tablet Take 1 tablet by mouth daily. 30 tablet 11   No current facility-administered medications for this visit.    SURGICAL HISTORY:  Past Surgical History:  Procedure Laterality Date   BRONCHIAL NEEDLE ASPIRATION BIOPSY  09/02/2022   Procedure: BRONCHIAL NEEDLE ASPIRATION BIOPSIES;  Surgeon: Brenna Adine CROME, DO;  Location: [facility];  Service: Cardiopulmonary;;   IR IMAGING GUIDED PORT INSERTION  09/29/2022   MULTIPLE TOOTH EXTRACTIONS     ORIF ANKLE FRACTURE  05/03/2012   Procedure: OPEN REDUCTION INTERNAL FIXATION (ORIF) ANKLE FRACTURE;  Surgeon: Norleen Armor, MD;  Location: [facility];  Service: Orthopedics;  Laterality: Left;   VIDEO BRONCHOSCOPY WITH ENDOBRONCHIAL ULTRASOUND Bilateral 09/02/2022   Procedure: VIDEO BRONCHOSCOPY WITH  ENDOBRONCHIAL ULTRASOUND;  Surgeon: Brenna Adine CROME, DO;  Location: [facility];  Service: Cardiopulmonary;  Laterality: Bilateral;    REVIEW OF SYSTEMS:   Constitutional: Negative for appetite change, chills, fatigue, fever and unexpected weight change.  HENT:  Negative for mouth sores, nosebleeds, sore throat and trouble swallowing.   Eyes: Negative for eye problems and icterus.  Respiratory:  Negative for hemoptysis, cough, shortness of breath and wheezing.   Cardiovascular: Negative for chest  pain and leg swelling.  Gastrointestinal: Negative for abdominal pain, constipation, diarrhea, nausea and vomiting.  Genitourinary: Negative for bladder incontinence, difficulty urinating, dysuria, frequency and hematuria.   Musculoskeletal:  Negative for back pain, gait problem, neck pain and neck stiffness.  Skin: Negative for itching and rash.  Neurological: . Negative for dizziness, headaches, extremity weakness, gait problem, light-headedness and seizures.  Hematological: Negative for adenopathy. Does not bruise/bleed easily.  Psychiatric/Behavioral: Negative for confusion, depression and sleep disturbance. The patient is not nervous/anxious.     PHYSICAL EXAMINATION:  There were no vitals taken for this visit.  ECOG PERFORMANCE STATUS: 1  Physical Exam  Constitutional: Oriented to person, place, and time and thin appearing female and in no distress. HENT:  Head: Normocephalic and atraumatic.  Mouth/Throat: Oropharynx is clear and moist. No oropharyngeal exudate.  Eyes: Conjunctivae are normal. Right eye exhibits no discharge. Left eye exhibits no discharge. No scleral icterus.  Neck: Normal range of motion. Neck supple.  Cardiovascular: Normal rate, regular rhythm, normal heart sounds and intact distal pulses.   Pulmonary/Chest: Effort normal and breath sounds normal. No respiratory distress. No wheezes. No rales.  Abdominal: Soft. Bowel sounds are normal. Exhibits no distension and no mass. There is no tenderness.  Musculoskeletal: Normal range of motion. Exhibits no edema.  Lymphadenopathy:    No cervical adenopathy.  Neurological: Alert and oriented to person, place, and time. Exhibits normal muscle tone. Gait normal. Coordination normal.  Skin: Skin is warm and dry. No rash noted. Not diaphoretic. No erythema. No pallor.  Psychiatric: Mood, memory and judgment normal.  Vitals reviewed. LABORATORY DATA: Lab Results  Component Value Date   WBC 4.6 04/12/2024   HGB 11.8 (L)  04/12/2024   HCT 34.5 (L) 04/12/2024   MCV 97.7 04/12/2024   PLT 222 04/12/2024      Chemistry      Component Value Date/Time   NA 137 04/12/2024 0855   K 3.8 04/12/2024 0855   CL 106 04/12/2024 0855   CO2 25 04/12/2024 0855   BUN 9 04/12/2024 0855   CREATININE 0.72 04/12/2024 0855   CREATININE 0.53 09/21/2022 0942      Component Value Date/Time   CALCIUM 9.4 04/12/2024 0855   ALKPHOS 72 04/12/2024 0855   AST 15 04/12/2024 0855   ALT 10 04/12/2024 0855   BILITOT 0.4 04/12/2024 0855       RADIOGRAPHIC STUDIES:  No results found.   ASSESSMENT/PLAN:  [name] is a very pleasant 68 year old female with metastatic small cell lung cancer (T3, N3, M1 C) .   #Metastatic small cell lung cancer: --Initially presented with large right lower lobe lung masses in addition to bulky right hilar and mediastinal lymphadenopathy as well as suspicious lytic bone lesion at L1. She was diagnosed in March 2024.  --Started systemic chemotherapy with carboplatin  for AUC of 4 on day 1 and etoposide  80 Mg/M2 on days 1, 2 and 3 with Neulasta  support on day 5 as well as Imfinzi  1500 Mg IV on day 1 every 3  weeks. She had reduced dose of carboplatin  and etoposide  side because of her HIV status. After cycle 5, she transitioned to single agent maintenance immunotherapy with Imfinzi  1500 mg IV every 4 weeks she is status post 21 cycles and tolerated it well.  PLAN: --Due for Cycle 23, Day 1 of Imfinizi today --Labs from today show WBC 6.1, Hgb 12.6, Plt 240, creatinine and LFTs are in range.  --Proceed with treatment without any dose modifications.  --RTC in 4 weeks with labs and follow up prior to Cycle 24, Day 1.    The patient was advised to call immediately if she has any concerning symptoms in the interval.  The patient voices understanding of current disease status and treatment options and is in agreement with the current care plan.  All questions were answered. The patient knows to call  the clinic with any problems, questions or concerns. We can certainly see the patient much sooner if necessary     I have spent a total of 30 minutes minutes of face-to-face and non-face-to-face time, preparing to see the patient, performing a medically appropriate examination, counseling and educating the patient, documenting clinical information in the electronic health record, independently interpreting results and communicating results to the patient, and care coordination.   Johnston Police PA-C Dept of Hematology and Oncology [facility] at [facility] Phone: [phone]

## 2024-05-10 ENCOUNTER — Inpatient Hospital Stay

## 2024-05-10 ENCOUNTER — Inpatient Hospital Stay: Attending: Internal Medicine | Admitting: Physician Assistant

## 2024-05-10 VITALS — BP 131/65 | HR 85 | Resp 16

## 2024-05-10 VITALS — BP 121/79 | HR 96 | Temp 97.4°F | Resp 17 | Ht 69.0 in | Wt 139.0 lb

## 2024-05-10 DIAGNOSIS — C3431 Malignant neoplasm of lower lobe, right bronchus or lung: Secondary | ICD-10-CM

## 2024-05-10 DIAGNOSIS — M899 Disorder of bone, unspecified: Secondary | ICD-10-CM | POA: Insufficient documentation

## 2024-05-10 DIAGNOSIS — Z5112 Encounter for antineoplastic immunotherapy: Secondary | ICD-10-CM | POA: Insufficient documentation

## 2024-05-10 DIAGNOSIS — Z21 Asymptomatic human immunodeficiency virus [HIV] infection status: Secondary | ICD-10-CM | POA: Diagnosis not present

## 2024-05-10 LAB — CMP (CANCER CENTER ONLY)
ALT: 12 U/L (ref 0–44)
AST: 22 U/L (ref 15–41)
Albumin: 4.2 g/dL (ref 3.5–5.0)
Alkaline Phosphatase: 80 U/L (ref 38–126)
Anion gap: 13 (ref 5–15)
BUN: 10 mg/dL (ref 8–23)
CO2: 21 mmol/L — ABNORMAL LOW (ref 22–32)
Calcium: 9.5 mg/dL (ref 8.9–10.3)
Chloride: 105 mmol/L (ref 98–111)
Creatinine: 0.73 mg/dL (ref 0.44–1.00)
GFR, Estimated: >60 mL/min (ref >60)
Glucose, Bld: 75 mg/dL (ref 70–99)
Potassium: 4 mmol/L (ref 3.5–5.1)
Sodium: 138 mmol/L (ref 135–145)
Total Bilirubin: 0.3 mg/dL (ref 0.0–1.2)
Total Protein: 8.5 g/dL — ABNORMAL HIGH (ref 6.5–8.1)

## 2024-05-10 LAB — CBC WITH DIFFERENTIAL (CANCER CENTER ONLY)
Abs Immature Granulocytes: 0.02 K/uL (ref 0.00–0.07)
Basophils Absolute: 0 K/uL (ref 0.0–0.1)
Basophils Relative: 1 %
Eosinophils Absolute: 0.1 K/uL (ref 0.0–0.5)
Eosinophils Relative: 1 %
HCT: 37.5 % (ref 36.0–46.0)
Hemoglobin: 12.6 g/dL (ref 12.0–15.0)
Immature Granulocytes: 0 %
Lymphocytes Relative: 19 %
Lymphs Abs: 1.1 K/uL (ref 0.7–4.0)
MCH: 33.2 pg (ref 26.0–34.0)
MCHC: 33.6 g/dL (ref 30.0–36.0)
MCV: 98.7 fL (ref 80.0–100.0)
Monocytes Absolute: 0.3 K/uL (ref 0.1–1.0)
Monocytes Relative: 5 %
Neutro Abs: 4.5 K/uL (ref 1.7–7.7)
Neutrophils Relative %: 74 %
Platelet Count: 240 K/uL (ref 150–400)
RBC: 3.8 MIL/uL — ABNORMAL LOW (ref 3.87–5.11)
RDW: 12.7 % (ref 11.5–15.5)
WBC Count: 6.1 K/uL (ref 4.0–10.5)
nRBC: 0 % (ref 0.0–0.2)

## 2024-05-10 MED ORDER — SODIUM CHLORIDE 0.9 % IV SOLN: Freq: Once | INTRAVENOUS | Status: AC

## 2024-05-10 MED ORDER — SODIUM CHLORIDE 0.9 % IV SOLN
1500.0000 mg | Freq: Once | INTRAVENOUS | Status: AC
  Administered 2024-05-10: 1500 mg via INTRAVENOUS
  Filled 2024-05-10: qty 30

## 2024-05-10 NOTE — Patient Instructions (Signed)

## 2024-05-21 ENCOUNTER — Other Ambulatory Visit: Payer: Self-pay | Admitting: Physician Assistant

## 2024-05-21 ENCOUNTER — Other Ambulatory Visit: Payer: Self-pay

## 2024-05-22 ENCOUNTER — Other Ambulatory Visit: Payer: Self-pay

## 2024-05-27 ENCOUNTER — Other Ambulatory Visit: Payer: Self-pay

## 2024-05-29 ENCOUNTER — Other Ambulatory Visit: Payer: Self-pay

## 2024-05-31 ENCOUNTER — Other Ambulatory Visit: Payer: Self-pay

## 2024-06-01 ENCOUNTER — Other Ambulatory Visit (HOSPITAL_COMMUNITY): Payer: Self-pay

## 2024-06-03 ENCOUNTER — Other Ambulatory Visit: Payer: Self-pay

## 2024-06-04 ENCOUNTER — Other Ambulatory Visit (HOSPITAL_COMMUNITY): Payer: Self-pay

## 2024-06-04 ENCOUNTER — Other Ambulatory Visit: Payer: Self-pay

## 2024-06-07 ENCOUNTER — Inpatient Hospital Stay

## 2024-06-07 ENCOUNTER — Inpatient Hospital Stay: Attending: Internal Medicine | Admitting: Internal Medicine

## 2024-06-07 ENCOUNTER — Inpatient Hospital Stay: Admitting: Physician Assistant

## 2024-06-07 VITALS — BP 152/75 | HR 87

## 2024-06-07 VITALS — BP 177/94 | HR 93 | Temp 97.1°F | Resp 18 | Wt 135.0 lb

## 2024-06-07 DIAGNOSIS — Z21 Asymptomatic human immunodeficiency virus [HIV] infection status: Secondary | ICD-10-CM | POA: Insufficient documentation

## 2024-06-07 DIAGNOSIS — M899 Disorder of bone, unspecified: Secondary | ICD-10-CM | POA: Diagnosis not present

## 2024-06-07 DIAGNOSIS — C3412 Malignant neoplasm of upper lobe, left bronchus or lung: Secondary | ICD-10-CM | POA: Diagnosis not present

## 2024-06-07 DIAGNOSIS — Z5112 Encounter for antineoplastic immunotherapy: Secondary | ICD-10-CM | POA: Diagnosis present

## 2024-06-07 DIAGNOSIS — C3490 Malignant neoplasm of unspecified part of unspecified bronchus or lung: Secondary | ICD-10-CM

## 2024-06-07 DIAGNOSIS — C3431 Malignant neoplasm of lower lobe, right bronchus or lung: Secondary | ICD-10-CM

## 2024-06-07 DIAGNOSIS — R030 Elevated blood-pressure reading, without diagnosis of hypertension: Secondary | ICD-10-CM | POA: Diagnosis not present

## 2024-06-07 LAB — CMP (CANCER CENTER ONLY)
ALT: 20 U/L (ref 0–44)
AST: 30 U/L (ref 15–41)
Albumin: 4.5 g/dL (ref 3.5–5.0)
Alkaline Phosphatase: 92 U/L (ref 38–126)
Anion gap: 13 (ref 5–15)
BUN: 13 mg/dL (ref 8–23)
CO2: 22 mmol/L (ref 22–32)
Calcium: 9.8 mg/dL (ref 8.9–10.3)
Chloride: 104 mmol/L (ref 98–111)
Creatinine: 0.67 mg/dL (ref 0.44–1.00)
GFR, Estimated: >60 mL/min
Glucose, Bld: 75 mg/dL (ref 70–99)
Potassium: 3.8 mmol/L (ref 3.5–5.1)
Sodium: 139 mmol/L (ref 135–145)
Total Bilirubin: 0.6 mg/dL (ref 0.0–1.2)
Total Protein: 8.8 g/dL — ABNORMAL HIGH (ref 6.5–8.1)

## 2024-06-07 LAB — CBC WITH DIFFERENTIAL (CANCER CENTER ONLY)
Abs Immature Granulocytes: 0.01 K/uL (ref 0.00–0.07)
Basophils Absolute: 0 K/uL (ref 0.0–0.1)
Basophils Relative: 1 %
Eosinophils Absolute: 0.2 K/uL (ref 0.0–0.5)
Eosinophils Relative: 4 %
HCT: 40.5 % (ref 36.0–46.0)
Hemoglobin: 13.6 g/dL (ref 12.0–15.0)
Immature Granulocytes: 0 %
Lymphocytes Relative: 32 %
Lymphs Abs: 1.7 K/uL (ref 0.7–4.0)
MCH: 33.1 pg (ref 26.0–34.0)
MCHC: 33.6 g/dL (ref 30.0–36.0)
MCV: 98.5 fL (ref 80.0–100.0)
Monocytes Absolute: 0.3 K/uL (ref 0.1–1.0)
Monocytes Relative: 7 %
Neutro Abs: 2.9 K/uL (ref 1.7–7.7)
Neutrophils Relative %: 56 %
Platelet Count: 233 K/uL (ref 150–400)
RBC: 4.11 MIL/uL (ref 3.87–5.11)
RDW: 12.6 % (ref 11.5–15.5)
WBC Count: 5.1 K/uL (ref 4.0–10.5)
nRBC: 0 % (ref 0.0–0.2)

## 2024-06-07 MED ORDER — SODIUM CHLORIDE 0.9 % IV SOLN: Freq: Once | INTRAVENOUS | Status: AC

## 2024-06-07 MED ORDER — SODIUM CHLORIDE 0.9 % IV SOLN
1500.0000 mg | Freq: Once | INTRAVENOUS | Status: AC
  Administered 2024-06-07: 1500 mg via INTRAVENOUS
  Filled 2024-06-07: qty 30

## 2024-06-07 NOTE — Patient Instructions (Signed)

## 2024-06-07 NOTE — Progress Notes (Signed)
 "     [facility] [phone]   [phone]  OFFICE PROGRESS NOTE  Joan Domino, [name] [address] [place] [address]  DIAGNOSIS:  1) Extensive stage small cell lung cancer (T3, N3, M1 C) . She presented with large right lower lobe lung masses in addition to bulky right hilar and mediastinal lymphadenopathy as well as suspicious lytic bone lesion at L1. She was diagnosed in March 2024.  2) HIV diagnosed in March 2024   PRIOR THERAPY: None   CURRENT THERAPY: Systemic chemotherapy with carboplatin  for AUC of 4 on day 1 and etoposide  80 Mg/M2 on days 1, 2 and 3 with Imfinzi  1500 Mg on day 1 every 3 weeks with Neulasta  support on day 5. First dose September 14, 2022 . She is on dose reduced chemotherapy due to her HIV. Status post 23 cycles.  Starting from cycle #5 her treatment with with maintenance immunotherapy with Imfinzi  1500 Mg IV every 4 weeks  INTERVAL HISTORY: [name] 68 y.o. female returns to the clinic today for follow-up visit. Discussed the use of AI scribe software for clinical note transcription with the patient, who gave verbal consent to proceed.  History of Present Illness [name] is a 68 year old female with extensive stage small cell lung cancer and HIV who presents for pre-infusion evaluation prior to cycle 24 of maintenance durvalumab .  She was diagnosed with extensive stage small cell lung cancer in March 2024 and completed four cycles of carboplatin  and durvalumab  as induction therapy. Since cycle five, she has been maintained on single-agent durvalumab  every four weeks and is now presenting for evaluation prior to cycle 24. She reports no chest pain, dyspnea, nausea, or vomiting. She endorses a mild cough, which she attributes to a recent upper respiratory infection and recent COVID vaccination. She otherwise feels well and spent the recent holiday resting at home.  She has HIV, managed by infectious disease specialists,  with no new symptoms or concerns related to HIV discussed at this visit.  She was found to have elevated blood pressure at today's visit. She is not on antihypertensive medications, does not routinely monitor her blood pressure at home, but owns a blood pressure monitor. She is asymptomatic from a cardiovascular standpoint.    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  MEDICATIONS:  Current Outpatient Medications  Medication Sig Dispense Refill   acetaminophen  (RA ACETAMINOPHEN ) 325 MG tablet Take 1 tablet (325 mg total) by mouth in the morning, at noon, in the evening, and at bedtime. 120 tablet 0   azithromycin  (ZITHROMAX  Z-PAK) 250 MG tablet Take as directed 6 each 0   benzonatate  (TESSALON ) 100 MG capsule Take 1 capsule (100 mg total) by mouth 3 (three) times daily as needed. 30 capsule 2   clopidogrel  (PLAVIX ) 75 MG tablet Take 1 tablet (75 mg total) by mouth daily. 30 tablet 5   dextromethorphan  15 MG/5ML syrup Take 10 mLs (30 mg total) by mouth 3 (three) times daily as needed for cough. 120 mL 0   dolutegravir -lamiVUDine  (DOVATO ) 50-300 MG tablet Take 1 tablet by mouth daily. 30 tablet 11   No current facility-administered medications for  this visit.    SURGICAL HISTORY:  Past Surgical History:  Procedure Laterality Date   BRONCHIAL NEEDLE ASPIRATION BIOPSY  09/02/2022   Procedure: BRONCHIAL NEEDLE ASPIRATION BIOPSIES;  Surgeon: Brenna Adine CROME, [name];  Location: [facility];  Service: Cardiopulmonary;;   IR IMAGING GUIDED PORT INSERTION  09/29/2022   MULTIPLE TOOTH EXTRACTIONS     ORIF ANKLE FRACTURE  05/03/2012   Procedure: OPEN REDUCTION INTERNAL FIXATION (ORIF) ANKLE FRACTURE;  Surgeon: Norleen Armor, MD;   Location: [facility] OR;  Service: Orthopedics;  Laterality: Left;   VIDEO BRONCHOSCOPY WITH ENDOBRONCHIAL ULTRASOUND Bilateral 09/02/2022   Procedure: VIDEO BRONCHOSCOPY WITH ENDOBRONCHIAL ULTRASOUND;  Surgeon: Brenna Adine CROME, [name];  Location: [facility];  Service: Cardiopulmonary;  Laterality: Bilateral;    REVIEW OF SYSTEMS:  A comprehensive review of systems was negative except for: Respiratory: positive for cough   PHYSICAL EXAMINATION: General appearance: alert, cooperative, and no distress Head: Normocephalic, without obvious abnormality, atraumatic Neck: no adenopathy, no JVD, supple, symmetrical, trachea midline, and thyroid  not enlarged, symmetric, no tenderness/mass/nodules Lymph nodes: Cervical, supraclavicular, and axillary nodes normal. Resp: clear to auscultation bilaterally Back: symmetric, no curvature. ROM normal. No CVA tenderness. Cardio: regular rate and rhythm, S1, S2 normal, no murmur, click, rub or gallop GI: soft, non-tender; bowel sounds normal; no masses,  no organomegaly Extremities: extremities normal, atraumatic, no cyanosis or edema  ECOG PERFORMANCE STATUS: 1 - Symptomatic but completely ambulatory  There were no vitals taken for this visit.  LABORATORY DATA: Lab Results  Component Value Date   WBC 5.1 06/07/2024   HGB 13.6 06/07/2024   HCT 40.5 06/07/2024   MCV 98.5 06/07/2024   PLT 233 06/07/2024      Chemistry      Component Value Date/Time   NA 139 06/07/2024 0745   K 3.8 06/07/2024 0745   CL 104 06/07/2024 0745   CO2 22 06/07/2024 0745   BUN 13 06/07/2024 0745   CREATININE 0.67 06/07/2024 0745   CREATININE 0.53 09/21/2022 0942      Component Value Date/Time   CALCIUM 9.8 06/07/2024 0745   ALKPHOS 92 06/07/2024 0745   AST 30 06/07/2024 0745   ALT 20 06/07/2024 0745   BILITOT 0.6 06/07/2024 0745       RADIOGRAPHIC STUDIES: No results found.   ASSESSMENT AND PLAN: This is a very pleasant 68 years old African-American female with  Extensive stage small cell lung cancer (T3, N3, M1 C) . She presented with large right lower lobe lung masses in addition to bulky right hilar and mediastinal lymphadenopathy as well as suspicious lytic bone lesion at L1. She was diagnosed in March 2024.  The patient also has HIV diagnosed in March 2024 She is currently undergoing systemic chemotherapy with carboplatin  for AUC of 4 on day 1 and etoposide  80 Mg/M2 on days 1, 2 and 3 with Imfinzi  1500 Mg on day 1 every 3 weeks with Neulasta  support on day 5. First dose September 14, 2022 . She is on dose reduced chemotherapy due to her HIV. Status post 23 cycles.  Starting cycle #5 her treatment is with maintenance immunotherapy with Imfinzi  1500 Mg IV every 4 weeks.  She has been tolerating her treatment fairly well. Assessment and Plan Assessment & Plan Extensive stage small cell lung cancer She missed her scheduled imaging this month, which is necessary for ongoing assessment. - Administered cycle 24 of durvalumab  maintenance therapy. - Ordered full body scan to be completed prior to next visit. - Instructed her to complete  imaging before her next appointment in four weeks.  Elevated blood pressure She was found to have elevated blood pressure at today's visit, is asymptomatic from a cardiovascular standpoint, and is not on antihypertensive medication. - Instructed her to monitor blood pressure at home and at pharmacies. - Advised her to contact her primary care provider if blood pressure remains elevated or worsens, as antihypertensive therapy may be indicated. She was advised to call immediately if she has any other concerning symptoms in the interval. The patient voices understanding of current disease status and treatment options and is in agreement with the current care plan.  All questions were answered. The patient knows to call the clinic with any problems, questions or concerns. We can certainly see the patient much sooner if necessary.  The  total time spent in the appointment was 20 minutes.  Disclaimer: This note was dictated with voice recognition software. Similar sounding words can inadvertently be transcribed and may not be corrected upon review.        "

## 2024-06-10 ENCOUNTER — Inpatient Hospital Stay

## 2024-06-10 ENCOUNTER — Inpatient Hospital Stay: Admitting: Internal Medicine

## 2024-06-10 ENCOUNTER — Other Ambulatory Visit (HOSPITAL_COMMUNITY): Payer: Self-pay

## 2024-06-10 ENCOUNTER — Other Ambulatory Visit: Payer: Self-pay | Admitting: Physician Assistant

## 2024-06-10 ENCOUNTER — Other Ambulatory Visit: Payer: Self-pay

## 2024-06-10 NOTE — Progress Notes (Signed)
 Specialty Pharmacy Refill Coordination Note  Spoke with [name] is a 68 y.o. female contacted today regarding refills of specialty medication(s) Dolutegravir -lamiVUDine  (DOVATO )  Doses on hand: 6  Patient requested: Delivery   Delivery date: 06/12/24   Verified address: [address] [place] [address]  Medication will be filled on 06/11/24  **Also fill Plavix **

## 2024-06-11 ENCOUNTER — Telehealth: Payer: Self-pay

## 2024-06-11 ENCOUNTER — Other Ambulatory Visit: Payer: Self-pay

## 2024-06-11 ENCOUNTER — Other Ambulatory Visit (HOSPITAL_COMMUNITY): Payer: Self-pay

## 2024-06-11 NOTE — Telephone Encounter (Signed)
 Patient calls nurse line requesting to schedule appointment with Dr. Lafe. She reports that she missed her previous appt scheduled back in November due to our office not allowing time for transportation.   Patient states that she knows her BP is high and is just going to go to urgent care.   Upon chart review, it does not look like this patient has ever established at our office. However, Dr. Lafe is listed as PCP.   Patient has had similar issues in the past with transportation preventing her from coming to her initial visit.   Forwarding to research officer, political party for determination on if patient should be able to reschedule if she calls back to the office.   Chiquita JAYSON English, RN

## 2024-06-16 ENCOUNTER — Other Ambulatory Visit (HOSPITAL_COMMUNITY): Payer: Self-pay

## 2024-06-17 ENCOUNTER — Encounter: Payer: Self-pay | Admitting: Internal Medicine

## 2024-06-17 ENCOUNTER — Encounter: Payer: Self-pay | Admitting: Physician Assistant

## 2024-06-17 ENCOUNTER — Other Ambulatory Visit: Payer: Self-pay

## 2024-06-18 ENCOUNTER — Encounter: Payer: Self-pay | Admitting: Physician Assistant

## 2024-06-18 ENCOUNTER — Encounter: Payer: Self-pay | Admitting: Internal Medicine

## 2024-06-25 ENCOUNTER — Encounter (HOSPITAL_COMMUNITY): Payer: Self-pay

## 2024-06-25 ENCOUNTER — Ambulatory Visit (HOSPITAL_COMMUNITY)
Admission: RE | Admit: 2024-06-25 | Discharge: 2024-06-25 | Disposition: A | Discharge status: Home / Self Care | Source: Ambulatory Visit | Attending: Internal Medicine | Admitting: Internal Medicine

## 2024-06-25 ENCOUNTER — Other Ambulatory Visit: Payer: Self-pay

## 2024-06-25 DIAGNOSIS — C3490 Malignant neoplasm of unspecified part of unspecified bronchus or lung: Secondary | ICD-10-CM | POA: Insufficient documentation

## 2024-06-25 MED ORDER — IOHEXOL 300 MG/ML  SOLN
100.0000 mL | Freq: Once | INTRAMUSCULAR | Status: AC | PRN
  Administered 2024-06-25: 100 mL via INTRAVENOUS

## 2024-06-25 MED ORDER — HEPARIN SOD (PORK) LOCK FLUSH 100 UNIT/ML IV SOLN
INTRAVENOUS | Status: AC
  Filled 2024-06-25: qty 5

## 2024-06-25 MED ORDER — HEPARIN SOD (PORK) LOCK FLUSH 100 UNIT/ML IV SOLN
500.0000 [IU] | Freq: Once | INTRAVENOUS | Status: AC
  Administered 2024-06-25: 500 [IU] via INTRAVENOUS

## 2024-06-25 NOTE — Progress Notes (Signed)
 The ASCVD Risk score (Arnett DK, et al., 2019) failed to calculate for the following reasons:   Risk score cannot be calculated because patient has a medical history suggesting prior/existing ASCVD   * - Cholesterol units were assumed  Duwaine Lowe, BSN, [employer] [employer]

## 2024-07-04 ENCOUNTER — Other Ambulatory Visit: Payer: Self-pay

## 2024-07-04 ENCOUNTER — Encounter: Payer: Self-pay | Admitting: Physician Assistant

## 2024-07-04 ENCOUNTER — Encounter: Payer: Self-pay | Admitting: Internal Medicine

## 2024-07-05 ENCOUNTER — Telehealth: Payer: Self-pay | Admitting: Internal Medicine

## 2024-07-05 ENCOUNTER — Other Ambulatory Visit: Payer: Self-pay

## 2024-07-05 ENCOUNTER — Inpatient Hospital Stay

## 2024-07-05 ENCOUNTER — Inpatient Hospital Stay: Admitting: Physician Assistant

## 2024-07-05 NOTE — Telephone Encounter (Signed)
 Called pt she refused to listen saying  you not going to change my appt unless I tell you tried explaining that it was going to be a telephone call but she didn't like it and the line cut

## 2024-07-08 ENCOUNTER — Inpatient Hospital Stay

## 2024-07-08 ENCOUNTER — Other Ambulatory Visit (HOSPITAL_COMMUNITY): Payer: Self-pay

## 2024-07-08 ENCOUNTER — Inpatient Hospital Stay: Attending: Internal Medicine | Admitting: Internal Medicine

## 2024-07-08 DIAGNOSIS — Z5112 Encounter for antineoplastic immunotherapy: Secondary | ICD-10-CM | POA: Insufficient documentation

## 2024-07-08 DIAGNOSIS — C3400 Malignant neoplasm of unspecified main bronchus: Secondary | ICD-10-CM | POA: Diagnosis not present

## 2024-07-08 DIAGNOSIS — M899 Disorder of bone, unspecified: Secondary | ICD-10-CM | POA: Insufficient documentation

## 2024-07-08 DIAGNOSIS — C3431 Malignant neoplasm of lower lobe, right bronchus or lung: Secondary | ICD-10-CM | POA: Insufficient documentation

## 2024-07-08 DIAGNOSIS — Z21 Asymptomatic human immunodeficiency virus [HIV] infection status: Secondary | ICD-10-CM | POA: Insufficient documentation

## 2024-07-08 NOTE — Progress Notes (Signed)
 " [facility] [phone]   Fax:([phone]  PROGRESS NOTE FOR TELEMEDICINE VISITS  [name], DO [address] [place] [address]  I connected withNAME@ on 07/08/24 at 11:15 AM EST by telephone visit and verified that I am speaking with the correct person using two identifiers.   I discussed the limitations, risks, security and privacy concerns of performing an evaluation and management service by telemedicine and the availability of in-person appointments. I also discussed with the patient that there may be a patient responsible charge related to this service. The patient expressed understanding and agreed to proceed.  Other persons participating in the visit and their role in the encounter:  None  Patient's location:  Home Provider's location:  Home  DIAGNOSIS:  1) Extensive stage small cell lung cancer (T3, N3, M1 C) . She presented with large right lower lobe lung masses in addition to bulky right hilar and mediastinal lymphadenopathy as well as suspicious lytic bone lesion at L1. She was diagnosed in March 2024.  2) HIV diagnosed in March 2024   PRIOR THERAPY: None   CURRENT THERAPY: Systemic chemotherapy with carboplatin  for AUC of 4 on day 1 and etoposide  80 Mg/M2 on days 1, 2 and 3 with Imfinzi  1500 Mg on day 1 every 3 weeks with Neulasta  support on day 5. First dose September 14, 2022 . She is on dose reduced chemotherapy due to her HIV. Status post 24 cycles.  Starting from cycle #5 her treatment with with maintenance immunotherapy with Imfinzi  1500 Mg IV every 4 weeks  INTERVAL HISTORY: [name] 69 y.o. female has a telephone virtual visit with me today for evaluation and discussion of her scan results before starting cycle #25.  The patient is feeling fine today with no concerning complaints.  She denied having any current chest pain, shortness of breath, cough or hemoptysis.  She has no nausea, vomiting, diarrhea or constipation.  She  has no headache or visual changes.  She had repeat CT scan of the chest, abdomen pelvis on June 25, 2024 and we are having the visit to discuss her scan results before the treatment.  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  MEDICATIONS:  Current Outpatient Medications  Medication Sig Dispense Refill   acetaminophen  (RA ACETAMINOPHEN ) 325 MG tablet Take 1 tablet (325 mg total) by mouth in the morning, at noon, in the evening, and at bedtime. 120 tablet 0   azithromycin  (ZITHROMAX  Z-PAK) 250 MG tablet Take as directed 6 each 0   benzonatate  (TESSALON ) 100 MG capsule Take 1 capsule (100 mg total) by mouth 3 (three) times daily as needed. 30 capsule 2   clopidogrel  (PLAVIX ) 75 MG tablet Take 1 tablet (75 mg total) by mouth daily. 30 tablet 5   dextromethorphan  15 MG/5ML syrup Take 10 mLs (30 mg total) by mouth 3 (three) times daily as needed for cough. 120 mL 0   dolutegravir -lamiVUDine  (DOVATO ) 50-300 MG tablet Take 1 tablet by mouth daily. 30 tablet 11   No current facility-administered medications for this visit.    SURGICAL HISTORY:  Past Surgical History:  Procedure Laterality Date   BRONCHIAL NEEDLE ASPIRATION BIOPSY  09/02/2022   Procedure: BRONCHIAL NEEDLE ASPIRATION BIOPSIES;  Surgeon: Brenna Adine CROME, DO;  Location: [facility];  Service: Cardiopulmonary;;   IR IMAGING GUIDED PORT INSERTION  09/29/2022   MULTIPLE TOOTH EXTRACTIONS     ORIF ANKLE FRACTURE  05/03/2012   Procedure: OPEN REDUCTION INTERNAL FIXATION (ORIF) ANKLE FRACTURE;  Surgeon: Norleen Armor, MD;  Location: [facility];  Service: Orthopedics;  Laterality: Left;   VIDEO BRONCHOSCOPY WITH ENDOBRONCHIAL ULTRASOUND Bilateral 09/02/2022   Procedure: VIDEO  BRONCHOSCOPY WITH ENDOBRONCHIAL ULTRASOUND;  Surgeon: Brenna Adine CROME, DO;  Location: [facility];  Service: Cardiopulmonary;  Laterality: Bilateral;    REVIEW OF SYSTEMS:  A comprehensive review of systems was negative.     LABORATORY DATA: Lab Results  Component Value Date   WBC 5.1 06/07/2024   HGB 13.6 06/07/2024   HCT 40.5 06/07/2024   MCV 98.5 06/07/2024   PLT 233 06/07/2024      Chemistry      Component Value Date/Time   NA 139 06/07/2024 0745   K 3.8 06/07/2024 0745   CL 104 06/07/2024 0745   CO2 22 06/07/2024 0745   BUN 13 06/07/2024 0745   CREATININE 0.67 06/07/2024 0745   CREATININE 0.53 09/21/2022 0942      Component Value Date/Time   CALCIUM 9.8 06/07/2024 0745   ALKPHOS 92 06/07/2024 0745   AST 30 06/07/2024 0745   ALT 20 06/07/2024 0745   BILITOT 0.6 06/07/2024 0745       RADIOGRAPHIC STUDIES: CT CHEST ABDOMEN PELVIS W CONTRAST Result Date: 06/28/2024 CLINICAL DATA:  Lung cancer restaging * Tracking Code: BO * EXAM: CT CHEST, ABDOMEN, AND PELVIS WITH CONTRAST TECHNIQUE: Multidetector CT imaging of the chest, abdomen and pelvis was performed following the standard protocol during bolus administration of intravenous contrast. RADIATION DOSE REDUCTION: This exam was performed according to the departmental dose-optimization program which includes automated exposure control, adjustment of the mA and/or kV according to patient size and/or use of iterative reconstruction technique. CONTRAST:  OMNIPAQUE  IOHEXOL  300 MG/ML  SOLN COMPARISON:  02/03/2024 FINDINGS: CT CHEST FINDINGS Cardiovascular: Aortic atherosclerosis. Left chest port catheter. Normal heart size. Three-vessel coronary artery calcifications. No pericardial effusion. Mediastinum/Nodes: Unchanged enlarged right hilar lymph node measuring 1.5 x 1.3 cm (series 2, image 32). No other enlarged mediastinal, hilar, or axillary lymph nodes. Thyroid  gland, trachea, and esophagus demonstrate no significant  findings. Lungs/Pleura: Severe emphysema. Mild bibasilar scarring or atelectasis. Tiny benign calcified nodule of the right lower lobe measuring 0.2 cm (series 6, image 97). No pleural effusion or pneumothorax. Musculoskeletal: No chest wall abnormality. No acute osseous findings. CT ABDOMEN PELVIS FINDINGS Hepatobiliary: No solid liver abnormality is seen. No gallstones, gallbladder wall thickening, or biliary dilatation. Pancreas: Unremarkable. No pancreatic ductal dilatation or surrounding inflammatory changes. Spleen: Normal in size without significant abnormality. Adrenals/Urinary Tract: Adrenal glands are unremarkable. Kidneys are normal, without renal calculi, solid lesion, or hydronephrosis. Bladder is unremarkable. Stomach/Bowel: Stomach is within normal limits. Appendix appears normal. No evidence of bowel wall thickening, distention, or inflammatory changes. Large burden of stool throughout the colon and rectum. Vascular/Lymphatic: Aortic atherosclerosis. No enlarged abdominal or pelvic lymph nodes. Reproductive: No mass or other abnormality. Other: No abdominal wall hernia or abnormality. No ascites. Musculoskeletal: No acute osseous findings. IMPRESSION: 1. Unchanged enlarged right hilar lymph node. 2. No other evidence of recurrent or metastatic disease in the chest, abdomen, or pelvis. 3. Severe emphysema. 4. Coronary artery disease. Aortic Atherosclerosis (ICD10-I70.0) and Emphysema (ICD10-J43.9). Electronically Signed   By:  Marolyn JONETTA Jaksch M.D.   On: 06/28/2024 12:41    ASSESSMENT AND PLAN: This is a very pleasant 69 years old African-American female with Extensive stage small cell lung cancer (T3, N3, M1 C) . She presented with large right lower lobe lung masses in addition to bulky right hilar and mediastinal lymphadenopathy as well as suspicious lytic bone lesion at L1. She was diagnosed in March 2024.  The patient also has HIV diagnosed in March 2024 She is currently undergoing systemic  chemotherapy with carboplatin  for AUC of 4 on day 1 and etoposide  80 Mg/M2 on days 1, 2 and 3 with Imfinzi  1500 Mg on day 1 every 3 weeks with Neulasta  support on day 5. First dose September 14, 2022 . She is on dose reduced chemotherapy due to her HIV. Status post 24 cycles.  Starting cycle #5 her treatment is with maintenance immunotherapy with Imfinzi  1500 Mg IV every 4 weeks.  She has been tolerating her treatment fairly well. The patient had repeat CT scan of the chest, abdomen and pelvis performed recently.  I personally independently reviewed the scan with the patient.  Her scan showed no concerning findings for disease progression. I recommended for her to continue her current treatment with maintenance durvalumab  and she will proceed with cycle #25 later this week. She will come back for follow-up visit in 4 weeks for evaluation before starting cycle #26. The patient was advised to call immediately if she has any other concerning symptoms in the interval. I discussed the assessment and treatment plan with the patient. The patient was provided an opportunity to ask questions and all were answered. The patient agreed with the plan and demonstrated an understanding of the instructions.   The patient was advised to call back or seek an in-person evaluation if the symptoms worsen or if the condition fails to improve as anticipated.  I provided 20 minutes of non face-to-face telephone visit time during this encounter, and > 50% was spent counseling as documented under my assessment & plan.  Sherrod MARLA Sherrod, MD 07/08/2024 10:01 AM  Disclaimer: This note was dictated with voice recognition software. Similar sounding words can inadvertently be transcribed and may not be corrected upon review.        "

## 2024-07-09 ENCOUNTER — Other Ambulatory Visit: Payer: Self-pay

## 2024-07-09 ENCOUNTER — Other Ambulatory Visit (HOSPITAL_COMMUNITY): Payer: Self-pay

## 2024-07-09 ENCOUNTER — Encounter: Payer: Self-pay | Admitting: Pharmacist

## 2024-07-10 ENCOUNTER — Other Ambulatory Visit (HOSPITAL_COMMUNITY): Payer: Self-pay

## 2024-07-10 ENCOUNTER — Other Ambulatory Visit: Payer: Self-pay

## 2024-07-11 ENCOUNTER — Other Ambulatory Visit: Payer: Self-pay

## 2024-07-12 ENCOUNTER — Inpatient Hospital Stay

## 2024-07-12 ENCOUNTER — Other Ambulatory Visit: Payer: Self-pay

## 2024-07-12 VITALS — BP 124/78 | HR 94 | Temp 98.4°F | Resp 16 | Wt 142.0 lb

## 2024-07-12 DIAGNOSIS — Z5112 Encounter for antineoplastic immunotherapy: Secondary | ICD-10-CM | POA: Diagnosis present

## 2024-07-12 DIAGNOSIS — Z21 Asymptomatic human immunodeficiency virus [HIV] infection status: Secondary | ICD-10-CM | POA: Diagnosis not present

## 2024-07-12 DIAGNOSIS — C3431 Malignant neoplasm of lower lobe, right bronchus or lung: Secondary | ICD-10-CM

## 2024-07-12 DIAGNOSIS — M899 Disorder of bone, unspecified: Secondary | ICD-10-CM | POA: Diagnosis not present

## 2024-07-12 LAB — CMP (CANCER CENTER ONLY)
ALT: 12 U/L (ref 0–44)
AST: 19 U/L (ref 15–41)
Albumin: 4.2 g/dL (ref 3.5–5.0)
Alkaline Phosphatase: 81 U/L (ref 38–126)
Anion gap: 10 (ref 5–15)
BUN: 17 mg/dL (ref 8–23)
CO2: 24 mmol/L (ref 22–32)
Calcium: 9.6 mg/dL (ref 8.9–10.3)
Chloride: 106 mmol/L (ref 98–111)
Creatinine: 0.91 mg/dL (ref 0.44–1.00)
GFR, Estimated: >60 mL/min
Glucose, Bld: 99 mg/dL (ref 70–99)
Potassium: 4.3 mmol/L (ref 3.5–5.1)
Sodium: 140 mmol/L (ref 135–145)
Total Bilirubin: 0.4 mg/dL (ref 0.0–1.2)
Total Protein: 8.3 g/dL — ABNORMAL HIGH (ref 6.5–8.1)

## 2024-07-12 LAB — CBC WITH DIFFERENTIAL (CANCER CENTER ONLY)
Abs Immature Granulocytes: 0.01 10*3/uL (ref 0.00–0.07)
Basophils Absolute: 0.1 10*3/uL (ref 0.0–0.1)
Basophils Relative: 1 %
Eosinophils Absolute: 0.2 10*3/uL (ref 0.0–0.5)
Eosinophils Relative: 4 %
HCT: 38 % (ref 36.0–46.0)
Hemoglobin: 12.9 g/dL (ref 12.0–15.0)
Immature Granulocytes: 0 %
Lymphocytes Relative: 26 %
Lymphs Abs: 1.2 10*3/uL (ref 0.7–4.0)
MCH: 33.7 pg (ref 26.0–34.0)
MCHC: 33.9 g/dL (ref 30.0–36.0)
MCV: 99.2 fL (ref 80.0–100.0)
Monocytes Absolute: 0.4 10*3/uL (ref 0.1–1.0)
Monocytes Relative: 9 %
Neutro Abs: 2.8 10*3/uL (ref 1.7–7.7)
Neutrophils Relative %: 60 %
Platelet Count: 247 10*3/uL (ref 150–400)
RBC: 3.83 MIL/uL — ABNORMAL LOW (ref 3.87–5.11)
RDW: 12.8 % (ref 11.5–15.5)
WBC Count: 4.7 10*3/uL (ref 4.0–10.5)
nRBC: 0 % (ref 0.0–0.2)

## 2024-07-12 MED ORDER — SODIUM CHLORIDE 0.9 % IV SOLN
1500.0000 mg | Freq: Once | INTRAVENOUS | Status: AC
  Administered 2024-07-12: 1500 mg via INTRAVENOUS
  Filled 2024-07-12: qty 30

## 2024-07-12 MED ORDER — SODIUM CHLORIDE 0.9 % IV SOLN: Freq: Once | INTRAVENOUS | Status: AC

## 2024-07-12 NOTE — Patient Instructions (Signed)

## 2024-07-16 ENCOUNTER — Other Ambulatory Visit (HOSPITAL_COMMUNITY): Payer: Self-pay

## 2024-07-17 ENCOUNTER — Other Ambulatory Visit: Payer: Self-pay

## 2024-07-18 ENCOUNTER — Other Ambulatory Visit: Payer: Self-pay

## 2024-08-01 ENCOUNTER — Inpatient Hospital Stay: Admitting: Physician Assistant

## 2024-08-01 ENCOUNTER — Inpatient Hospital Stay

## 2024-08-08 ENCOUNTER — Inpatient Hospital Stay: Admitting: Physician Assistant

## 2024-08-08 ENCOUNTER — Inpatient Hospital Stay: Attending: Internal Medicine

## 2024-08-08 ENCOUNTER — Inpatient Hospital Stay

## 2024-08-29 ENCOUNTER — Inpatient Hospital Stay

## 2024-08-29 ENCOUNTER — Inpatient Hospital Stay: Admitting: Internal Medicine

## 2024-09-05 ENCOUNTER — Inpatient Hospital Stay: Attending: Internal Medicine

## 2024-09-05 ENCOUNTER — Inpatient Hospital Stay: Admitting: Internal Medicine

## 2024-09-05 ENCOUNTER — Inpatient Hospital Stay

## 2024-10-03 ENCOUNTER — Inpatient Hospital Stay

## 2024-10-03 ENCOUNTER — Inpatient Hospital Stay: Attending: Internal Medicine

## 2024-10-03 ENCOUNTER — Inpatient Hospital Stay: Admitting: Physician Assistant
